# Patient Record
Sex: Male | Born: 1950 | Race: White | Hispanic: No | Marital: Married | Smoking: Former smoker
Health system: Southern US, Community
[De-identification: ages and names within clinical notes are randomized; demographics above are authoritative.]

## PROBLEM LIST (undated history)

## (undated) DIAGNOSIS — I10 Essential (primary) hypertension: Secondary | ICD-10-CM

## (undated) DIAGNOSIS — I6529 Occlusion and stenosis of unspecified carotid artery: Secondary | ICD-10-CM

## (undated) DIAGNOSIS — M1990 Unspecified osteoarthritis, unspecified site: Secondary | ICD-10-CM

## (undated) DIAGNOSIS — K219 Gastro-esophageal reflux disease without esophagitis: Secondary | ICD-10-CM

## (undated) DIAGNOSIS — Z87442 Personal history of urinary calculi: Secondary | ICD-10-CM

## (undated) DIAGNOSIS — C61 Malignant neoplasm of prostate: Secondary | ICD-10-CM

## (undated) DIAGNOSIS — E119 Type 2 diabetes mellitus without complications: Secondary | ICD-10-CM

## (undated) DIAGNOSIS — Z794 Long term (current) use of insulin: Secondary | ICD-10-CM

## (undated) DIAGNOSIS — G4730 Sleep apnea, unspecified: Secondary | ICD-10-CM

## (undated) DIAGNOSIS — G4733 Obstructive sleep apnea (adult) (pediatric): Secondary | ICD-10-CM

## (undated) DIAGNOSIS — N210 Calculus in bladder: Secondary | ICD-10-CM

## (undated) DIAGNOSIS — E785 Hyperlipidemia, unspecified: Secondary | ICD-10-CM

## (undated) HISTORY — PX: BACK SURGERY: SHX140

## (undated) HISTORY — PX: LUMBAR FUSION: SHX111

## (undated) HISTORY — PX: KNEE ARTHROPLASTY: SHX992

## (undated) HISTORY — DX: Type 2 diabetes mellitus without complications: E11.9

## (undated) HISTORY — PX: KNEE ARTHROSCOPY: SHX127

## (undated) HISTORY — DX: Unspecified osteoarthritis, unspecified site: M19.90

## (undated) HISTORY — PX: APPENDECTOMY: SHX54

## (undated) HISTORY — PX: COLONOSCOPY: SHX174

## (undated) HISTORY — PX: PROSTATE SURGERY: SHX751

## (undated) HISTORY — PX: INSERTION PROSTATE RADIATION SEED: SUR718

## (undated) HISTORY — PX: CERVICAL FUSION: SHX112

## (undated) HISTORY — DX: Type 2 diabetes mellitus without complications: Z79.4

---

## 2004-01-30 ENCOUNTER — Ambulatory Visit (HOSPITAL_BASED_OUTPATIENT_CLINIC_OR_DEPARTMENT_OTHER): Admission: RE | Admit: 2004-01-30 | Discharge: 2004-01-30 | Payer: Self-pay | Admitting: Orthopaedic Surgery

## 2004-01-30 ENCOUNTER — Ambulatory Visit (HOSPITAL_COMMUNITY): Admission: RE | Admit: 2004-01-30 | Discharge: 2004-01-30 | Payer: Self-pay | Admitting: Orthopaedic Surgery

## 2004-06-10 ENCOUNTER — Inpatient Hospital Stay (HOSPITAL_COMMUNITY): Admission: RE | Admit: 2004-06-10 | Discharge: 2004-06-11 | Payer: Self-pay | Admitting: Neurosurgery

## 2005-08-11 ENCOUNTER — Ambulatory Visit (HOSPITAL_COMMUNITY): Admission: RE | Admit: 2005-08-11 | Discharge: 2005-08-12 | Payer: Self-pay | Admitting: Neurosurgery

## 2005-09-03 ENCOUNTER — Inpatient Hospital Stay (HOSPITAL_COMMUNITY): Admission: RE | Admit: 2005-09-03 | Discharge: 2005-09-07 | Payer: Self-pay | Admitting: Neurosurgery

## 2005-11-19 ENCOUNTER — Encounter: Admission: RE | Admit: 2005-11-19 | Discharge: 2005-11-19 | Payer: Self-pay | Admitting: Neurosurgery

## 2006-04-15 ENCOUNTER — Encounter: Payer: Self-pay | Admitting: Vascular Surgery

## 2006-04-15 ENCOUNTER — Ambulatory Visit (HOSPITAL_COMMUNITY): Admission: RE | Admit: 2006-04-15 | Discharge: 2006-04-15 | Payer: Self-pay | Admitting: Orthopedic Surgery

## 2006-05-11 ENCOUNTER — Ambulatory Visit (HOSPITAL_COMMUNITY): Admission: RE | Admit: 2006-05-11 | Discharge: 2006-05-11 | Payer: Self-pay | Admitting: Orthopedic Surgery

## 2006-05-14 ENCOUNTER — Emergency Department (HOSPITAL_COMMUNITY): Admission: EM | Admit: 2006-05-14 | Discharge: 2006-05-14 | Payer: Self-pay | Admitting: Emergency Medicine

## 2007-08-31 ENCOUNTER — Encounter: Admission: RE | Admit: 2007-08-31 | Discharge: 2007-08-31 | Payer: Self-pay | Admitting: Neurosurgery

## 2007-12-28 ENCOUNTER — Ambulatory Visit (HOSPITAL_COMMUNITY): Admission: RE | Admit: 2007-12-28 | Discharge: 2007-12-28 | Payer: Self-pay | Admitting: Orthopedic Surgery

## 2008-06-14 ENCOUNTER — Ambulatory Visit (HOSPITAL_COMMUNITY): Admission: RE | Admit: 2008-06-14 | Discharge: 2008-06-14 | Payer: Self-pay | Admitting: Orthopedic Surgery

## 2009-02-13 ENCOUNTER — Encounter: Admission: RE | Admit: 2009-02-13 | Discharge: 2009-02-13 | Payer: Self-pay | Admitting: Neurosurgery

## 2009-08-27 ENCOUNTER — Ambulatory Visit: Payer: Self-pay | Admitting: Otolaryngology

## 2009-09-10 ENCOUNTER — Ambulatory Visit: Payer: Self-pay | Admitting: Otolaryngology

## 2009-09-16 ENCOUNTER — Ambulatory Visit: Payer: Self-pay | Admitting: Otolaryngology

## 2010-03-13 ENCOUNTER — Inpatient Hospital Stay: Payer: Self-pay | Admitting: Surgery

## 2010-11-17 NOTE — Op Note (Signed)
NAMELES, LONGMORE              ACCOUNT NO.:  [id number]   MEDICAL RECORD NO.:  [id number]          PATIENT TYPE:  AMB   LOCATION:  SDS                          FACILITY:  [facility]   PHYSICIAN:  Madlyn Frankel. Charlann Boxer, M.D.  DATE OF BIRTH:  Aug 24, 1950   DATE OF PROCEDURE:  06/14/2008  DATE OF DISCHARGE:  06/14/2008                               OPERATIVE REPORT   PREOPERATIVE DIAGNOSIS:  Recurrent symptoms following right knee  arthroscopy with findings concerning for medial meniscal pathology with  some underlying degenerative changes.   POSTOPERATIVE DIAGNOSES/FINDINGS:  1. The patient was noted to have consistent and nonprogressive      patellofemoral changes as previously reviewed.  2. Grade 2-3 changes within the medial compartment of the knee without      evidence of significant progression.  No evidence of eburnated      signs.  3. Medial meniscal pathology medially.  4. Small tearing of the mid body posterior horn of the lateral      meniscus, central portion.  5. Loose body approximately 1 cm diameter.  6. Synovitis scarring.   PROCEDURES:  1. Right knee diagnostic and operative arthroscopy with medial      meniscectomy.  2. Lateral partial meniscectomy.  3. Removal of loose body.  4. Synovectomy.  5. Medial patellofemoral chondral flaps, debridement of loose flaps of      cartilage.   SURGEON:  Madlyn Frankel. Charlann Boxer, MD   ASSISTANT:  None.   ANESTHESIA:  Preoperative local block plus general anesthesia plus a  postoperative block.   COMPLICATIONS:  None.   DRAINS:  None.   INDICATIONS FOR PROCEDURE:  Mr. Cornfield is a 60 year old Worker's Comp  patient, a patient of mine with previous arthroscopy for medial meniscal  pathology with underlying degenerative changes.  He failed to respond to  the postoperative course and had an MRI repeated, which did reveal  evidence concerning for repair or persistent tearing of the medial  meniscus.  Given the fact that he failed  conservative measures, did not  respond to cortisone injections, physical therapy, and medications, we  opted to proceed with right knee re-arthroscopy.  Risks, benefits were  discussed including the potential risk for arthritis, persistent  discomfort.  Consent was obtained.  A second opinion had also been  obtained revealing his finding.   PROCEDURE IN DETAIL:  The patient was brought to the operative theater.  Once adequate anesthesia, preoperative antibiotics administered, the  patient was positioned supine with the right leg in a leg holder.  Right  lower extremity was pre-scrubbed and prepped and draped in a sterile  fashion.  The patient's previous incisions in the inferolateral,  inferomedial, superomedial portals were utilized.  Diagnostic evaluation  of the knee including thorough examination revealed the above findings.  I focused the vast majority of the procedure medially as this is where  his primary symptoms.  A thorough examination did not reveal any very  unstable flaps or cartilage.  I did remove the vast majority of this  posterior horn leaving the attachment in place.  I then also extended  some of this debridement along the mid body and medial portion.  Following this, I further examined the meniscus to make sure that it was  stable and there were no unstable flaps or anything that flipped  underneath or above the meniscus.  I took the opportunity to debride  some flaps of cartilage within the medial compartment as well.   Lateral compartment was noted to be relatively intact, but this is where  I identified the loose body through the posterolateral aspect.  It was  removed with a combination of pituitary rongeur and a shaver.   I did use a straight-biting basket to remove some of the mid body  posterior horn meniscus, which had a central tearing.  Anteriorly,  significant synovectomy was carried out over the anterior and medial  aspect of the knee.  Previously  noted degenerative changes within the  patellofemoral compartment were dealt with as well with a 3.5 Cuda  shaver.   Following the removal of all the above, I reexamined the knee to make  sure there was no other unstable flaps or cartilage, any loose bodies,  etc.  Once I was satisfied that there were none, the instrumentation was  removed.  Portal sites all reapproximated with a nylon suture, and I  injected the knee with 25 mL of 0.25% Marcaine with epinephrine.  The  knee was then dressed into a sterile bulky Jones dressing.  He was  extubated and brought to the recovery in stable condition, tolerating  the procedure well.   Plans are to see him back in office for routine followup in 10-12 days.  Medications prescribed.  Ice recommended.  Crutches as needed to help  decrease some of his initial post-operative pain due to weight bearing.      Madlyn Frankel Charlann Boxer, M.D.  Electronically Signed     MDO/MEDQ  D:  06/14/2008  T:  06/15/2008  Job:  [id number]

## 2010-11-17 NOTE — Op Note (Signed)
NAMESYRUS, NAKAMA              ACCOUNT NO.:  [id number]   MEDICAL RECORD NO.:  [id number]          PATIENT TYPE:  AMB   LOCATION:  DAY                          FACILITY:  [facility]   PHYSICIAN:  Madlyn Frankel. Charlann Boxer, M.D.  DATE OF BIRTH:  1951/05/19   DATE OF PROCEDURE:  12/28/2007  DATE OF DISCHARGE:                               OPERATIVE REPORT   PREOPERATIVE DIAGNOSIS:  Left knee medial meniscal tear associated with  lateral meniscal tearing and osteoarthritis.   POSTOPERATIVE DIAGNOSIS/FINDINGS:  1. Complex tear to the posterior horn medial meniscus all way to the      mid body portion with extensive involvement.  2. 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  3. Tearing to the center portion of the anterior horn mid body of the      lateral meniscus with minimal degenerative changes noted laterally.  4. Extensive degenerative changes to the patellofemoral compartment      with diffuse grade III changes all over the trochanter.  5. Synovitis.   PROCEDURE:  Right knee diagnostic and operative arthroscopy with medial  and lateral meniscectomies, limited synovectomy, anterior medial and  patellofemoral and medial chondroplasty.   SURGEON:  Madlyn Frankel. Charlann Boxer, M.D.   ASSISTANT:  None.   ANESTHESIA:  General plus locally administered anesthetic.  The general  was a MAC anesthesia.   COMPLICATIONS:  None.   DRAINS:  None.   BLOOD LOSS:  Minimal.   INDICATIONS FOR PROCEDURE:  Mr. Berthelot is a 60 year old gentleman who  presented to the office for evaluation of right knee pain with  mechanical type symptoms and failure to respond to conservative  measures.  MRI revealed meniscal pathology.  Based on the mechanism of  injury and pre-injury did not have any problems in the knee, we reviewed  with him the indications of this procedure.  He had a previous surgery  on the left knee and felt that it had done quite well and was eager to  have this done to get rid of these  mechanical symptoms.  Risks and  benefits discussed including persistent discomfort, including the  potential of progression of arthritis.  We have reviewed in the setting  of worker's comp and the fact that we would work him through therapy for  a full 8 weeks getting him discharged.  Further arthroplasty option  would not be a result of this procedure.  That was reviewed.  Consent  obtained.   PROCEDURE IN DETAIL:  The patient was brought to operative theater.  Once adequate anesthesia, preoperative antibiotics, Ancef administered,  the patient was positioned supine with the right leg in the leg holder.  Right lower extremity was prepped and draped in sterile fashion.  Standard inferomedial, superior medial and inferolateral portals were  utilized.  Diagnostic evaluation of the knee revealed the above  findings.  The inferior medial portal was utilized as a sole working  portal.  Attention was first directed to this medial compartment  following initial debridement of some of the chondral flaps this allowed  for exposure.  I used a biting basket to extensively debride  the  posterior horn.  I then used a 3.5 Cuda shaver and removed all meniscal  fragments and contour remaining meniscus.  This process was repeated at  three different times in order to remove the meniscus back to a stable  level.  There was a large cleavage component and the meniscus was taken  back all way down to the peripheral third to a stable level.  It was  palpated with a hook and found to be stable.  This extended all way to  the mid body junction.  The remaining was okay.  The grade II to III  flaps of the medial compartment were noted to be present and shaved back  to stable level.   Anteriorly, a synovectomy was carried out.  There was a large amount of  synovitic response.  It hyperemic and I debrided some of this back.  There was no significant bleeding but it was necessary for exposure  purposes.  This  revealed extensive trochlear patellofemoral changes  which were not work-related but were debrided back to a stable level.   This again was not a cure of the arthritis.  It was a diagnostic  evaluation and debridement of flaps to prevent further potential injury.  In the lateral compartment noted this to be central tearing to the  central third of the meniscus from the anterior horn lateral to the  anterior horn be used a biting basket and a 3.52 Cuda shaver to debride  this back.   Following this I reexamined the knee a couple of different times to make  sure there is no loose fragments of cartilage.  We removed the  instrumentation, reapproximated the portal sites using a 4-0 nylon.  The  knee was then injected with 25% Marcaine with epinephrine.  The  patient's knee was then cleaned and dressed sterilely with Adaptic and a  bulky sterile dressing.  He was brought to the recovery room in stable  condition, tolerating the procedure well.      Madlyn Frankel Charlann Boxer, M.D.  Electronically Signed     MDO/MEDQ  D:  12/28/2007  T:  12/28/2007  Job:  [id number]

## 2010-11-20 NOTE — Op Note (Signed)
NAMESPARSH, CALLENS NO.:  [id number]   MEDICAL RECORD NO.:  [id number]          PATIENT TYPE:  INP   LOCATION:  3003                         FACILITY:  [facility]   PHYSICIAN:  Payton Doughty, M.D.      DATE OF BIRTH:  [birth year]   DATE OF PROCEDURE:  06/10/2004  DATE OF DISCHARGE:                                 OPERATIVE REPORT   PREOPERATIVE DIAGNOSIS:  Spondylosis, L5-S1.   POSTOPERATIVE DIAGNOSIS:  Spondylosis, L5-S1.  Autofusion at L5-S1.   OPERATION PERFORMED:  Right L5-S1 facetectomy and L5 foraminotomy and  decompressive laminectomy.   SURGEON:  Payton Doughty, M.D.   ANESTHESIA:  General.   INDICATIONS FOR PROCEDURE:  The patient is a 60 year old gentleman with  severe spondylosis at L5-S1 and right L5 and S1 radiculopathies.  Plan was  for a decompression and fusion at that level.   DESCRIPTION OF PROCEDURE:  The patient was taken to the operating room,  smoothly anesthetized, intubated, and placed prone on the operating table.  Following shave, prep and drape in the usual sterile fashion, the skin was  infiltrated with 1% lidocaine with 1:400,000 epinephrine.  Skin was incised  from mid-S1 to mid-L4 and the lamina and transverse process of L5 and S1  were exposed bilaterally in the subperiosteal plane.  Intraoperative x-ray  confirmed correctness of level.  Having confirmed correctness of level, the  pars interarticularis, lamina and inferior facet of L5 and the superior  facet of S1 were removed on the right side and the site of the previous  laminectomy.  This allowed decompression of the right L5 root.  The 5 root  was decompressed as it rounded the 5 pedicle.  It was followed completely  distally out into the soft tissue at the dorsal root ganglion.  This  resulted in complete decompression of the root.  The L5-S1 disk space was  then attempted to explore but it was found that it was completely calcified  with an osseous union across it.  It was  carefully inspected and careful  manipulation of tones demonstrated a solid fusion across the disk space.  Therefore, it was decided not to take all the bone out simply to put a new  fusion in there.  The wound was then irrigated and hemostasis assured.  The  nerve root was covered with Depo-Medrol soaked fat.  The fascia was  reapproximated with 0 Vicryl in interrupted fashion and subcutaneous tissue  was reapproximated with 0 Vicryl in interrupted fashion and the skin was  closed with 3-0 nylon in a running locked fashion.  Betadine Telfa dressing  was applied and made occlusive with Op-Site.  The patient was then  transferred to the recovery room in good condition.       MWR/MEDQ  D:  06/10/2004  T:  06/11/2004  Job:  [id number]

## 2010-11-20 NOTE — Op Note (Signed)
NAMETHAO, VANOVER              ACCOUNT NO.:  [id number]   MEDICAL RECORD NO.:  [id number]          PATIENT TYPE:  AMB   LOCATION:  SDS                          FACILITY:  [facility]   PHYSICIAN:  Payton Doughty, M.D.      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   POSTOPERATIVE DIAGNOSIS:  Spondylosis at C3-4.   OPERATIVE PROCEDURE:  Attempted C3-4 anterior decompression and fusion.   SURGEON:  Payton Doughty, M.D.   SERVICE:  Neurosurgery.   ANESTHESIA:  General endotracheal.   PREPARATION:  Prepped with sterile Betadine and scrubbed with alcohol wipe.   COMPLICATIONS:  Inability to access the C3-4 disk space.   DESCRIPTION OF PROCEDURE:  This is a 60 year old gentleman with spondylosis  at C3-4 who was taken to the operating room, smoothly anesthetized and  intubated, and placed supine on the operating table.  Following shaving,  prepped and draped in the usual sterile fashion.  Skin was incised about a  fingerbreadth below the angle of the mandible.  It extended from the midline  of the sternocleidomastoid's medial border.  Dissection through this  revealed a very large submandibular gland protruding down in the submental  space.  The sternocleidomastoid was identified.  Medial dissection was  carried out.  The carotid was identified, as were the bones of the anterior  cervical spine.  Working up as high as possible, a disk space was  encountered that was impossible to go above it without taking out some  parotid gland.  This was not something we were prepared to do.  An x-ray  demonstrated that we were at the C5-6 disk space.  I therefore felt it was  imprudent to continue and terminated the procedure.  The wound was irrigated  and hemostasis assured.  The platysma was closed with 3-0 Vicryl in  interrupted fashion, subcutaneous tissues were  reapproximated with 3-0  Vicryl in interrupted fashion and skin was closed with 4-0 Vicryl in a  running subcuticular fashion.  Benzoin and Steri-Strips were placed and made  occlusive with Telfa and OpSite, and the patient returned to the recovery  room in good condition.           ______________________________  Payton Doughty, M.D.     MWR/MEDQ  D:  08/11/2005  T:  08/11/2005  Job:  [id number]

## 2010-11-20 NOTE — Op Note (Signed)
Alexander Wilcox, Alexander Wilcox              ACCOUNT NO.:  [id number]   MEDICAL RECORD NO.:  [id number]          PATIENT TYPE:  AMB   LOCATION:  DAY                          FACILITY:  [facility]   PHYSICIAN:  John L. Rendall, M.D.  DATE OF BIRTH:  [birth year]   DATE OF PROCEDURE:  05/11/2006  DATE OF DISCHARGE:                                 OPERATIVE REPORT   INDICATIONS AND JUSTIFICATION FOR PROCEDURE:  History of chronic left knee  pain with swelling, crepitance with MRI positive for torn medial and lateral  menisci, left knee.  Justification for surgery in the main OR, sleep apnea,  and multiple medical issues but stable to return home if all goes well.   PREOPERATIVE DIAGNOSIS:  Torn medial and lateral menisci.   SURGICAL PROCEDURES:  Arthroscopic medial and lateral meniscectomy.   POSTOPERATIVE DIAGNOSIS:  Torn medial and lateral menisci.   SURGEON:  John L. Rendall, M.D.   ANESTHESIA:  MAC.   PROCEDURE:  Under MAC anesthesia, the left knee is prepared with DuraPrep  and draped as a sterile field post laterally with findings of a complex tear  of the lateral meniscus were found. Using a combination of straight, curved  right, 90 degrees right, and curved Marlon shaver,  this meniscus was taken  to stable meniscal rim. Fortunately, only grade 2 changes on the femoral  condyle and tibia were seen. In the medial compartment, there was  significant chondromalacia in the central weight bearing area, grade 2, that  required trimming with the shaver and posterior horn complex tear of the  meniscus. Using curved Marlon shaver, local chondroplasty was done and a  flap tear in the posterior horn was found and resected with a combination of  basket forceps, straight, curved and upbiting. Once this was completed, the  remainder of the knee was examined.  No significant patellofemoral  arthritis. The knee was then infiltrated with Marcaine with epinephrine and  a sterile compression bandage was  applied.  The patient returned to recovery  in good condition.      John L. Rendall, M.D.  Electronically Signed     JLR/MEDQ  D:  05/11/2006  T:  05/11/2006  Job:  [id number]

## 2010-11-20 NOTE — Op Note (Signed)
Alexander Wilcox, Alexander Wilcox NO.:  [id number]   MEDICAL RECORD NO.:  [id number]          PATIENT TYPE:  INP   LOCATION:  3025                         FACILITY:  [facility]   PHYSICIAN:  Payton Doughty, M.D.      DATE OF BIRTH:  1950/11/22   DATE OF PROCEDURE:  09/03/2005  DATE OF DISCHARGE:                                 OPERATIVE REPORT   PREOPERATIVE DIAGNOSIS:  Cervical spondylosis, C3-4.   POSTOPERATIVE DIAGNOSIS:  Cervical spondylosis, C3-4.   OPERATIVE PROCEDURE:  C3-4 posterior cervical decompression and fusion.   SURGEON:  Payton Doughty, MD.   NURSE ASSISTANTBasilia Jumbo.   ANESTHESIA:  General endotracheal.   PREPARATION:  Chlorhexidine scrub and alcohol wipe.   COMPLICATIONS:  None.   DESCRIPTION OF PROCEDURE:  This is a 60 year old, right-handed, white  gentleman with cervical spondylosis, who had had an attempted anterior  decompression at 3-4 and anatomically could not reach the 3-4 interspace so  he was operated from [employer].  Taken to the operating room and smoothly  anesthetized and intubated, placed on the [employer] bed and placed prone.  Following shave, prep, and drape in the usual sterile fashion, the skin was  infiltrated with 1% Lidocaine with 1:400,000 epinephrine and the skin  incised from C2 to C5 and the laminae of C3 and C4 were exposed bilaterally  in the subperiosteal plane, intraoperative x-ray confirmed correctness  level.  Lateral mass screws were placed in 3 and 4 using the standard  landmarks and intraoperative x-ray showed good placement of screws.  They  were connected with the rod.  Locking caps were placed and tightened.  A  central laminectomy was done of C3 and the superior portion of C4.  The sac  itself appeared somewhat tight.  The wound was irrigated and hemostasis  assured.  The remaining laminae and lateral mass of decorticated and BMP on  the extender matrix was placed over them.  Successive layers of #0 Vicryl,  #2-0 Vicryl,  and #3-0 nylon were used to close the fascia and muscle,  subcuticular tissues, and skin respectively.  A Betadine-Telfa dressing was  applied, made occlusive with OpSite, and the patient returned to the  recovery room in good condition.           ______________________________  Payton Doughty, M.D.     MWR/MEDQ  D:  09/03/2005  T:  09/04/2005  Job:  [id number]

## 2010-11-20 NOTE — H&P (Signed)
Alexander Wilcox, AGE              ACCOUNT NO.:  [id number]   MEDICAL RECORD NO.:  [id number]          PATIENT TYPE:  AMB   LOCATION:  SDS                          FACILITY:  [facility]   PHYSICIAN:  Payton Doughty, M.D.      DATE OF BIRTH:  1950/09/17   DATE OF ADMISSION:  08/11/2005  DATE OF DISCHARGE:                                HISTORY & PHYSICAL   ADMITTING DIAGNOSIS:  Disc and spondylosis C3-4.   SERVICE:  Neurosurgery.   This is a very nice now 60 year old right-handed white gentleman. He has had  several lumbar discs done, been doing well, then developed a lot of pain in  his neck radiating down both arms. Did not have a noted deficit. MR showed  spondylosis 3-4 with posterior osteophytes and compression of the spinal  cord. Significant distraction posterior longitudinal ligament. He is  admitted for an anterior decompression and fusion at that level.   MEDICAL HISTORY:  Fairly benign. He has hypertension and a little  cardiomyopathy. He takes:  1.  Toprol-XL 12.5 mg a day.  2.  Accupril 10 mg a day.  3.  Proscar 5 mg a day.  4.  Lasix 40 mg a day on a p.r.n. basis.   He is sensitive to ANTIINFLAMMATORIES so far as they cause him to retain  fluid.   SURGICAL HISTORY:  Lumbar disc x2 in the past.   SOCIAL HISTORY:  Does not smoke, quit 10 years ago. Drinks on a limited  social basis. Is a Midwife.   FAMILY HISTORY:  Both parents are deceased; histories are not given.   REVIEW OF SYSTEMS:  Remarkable for glasses, hypertension, swelling in his  hands and feet, shortness of breath, arm weakness, leg weakness, back pain,  leg pain, joint pain, and neck pain.   PHYSICAL EXAMINATION:  HEENT:  Within normal limits.  NECK:  He has somewhat limited range of motion of his neck. He does not have  Lhermitte's.  CHEST:  Clear.  CARDIAC:  Regular rate and rhythm.  ABDOMEN:  Large but nontender with no hepatosplenomegaly.  EXTREMITIES:  Without clubbing or cyanosis.  GENITOURINARY:  Deferred.  PERIPHERAL PULSES:  Good.  NEUROLOGIC:  He is awake, alert, and oriented. Cranial nerves intact. Motor  exam shows 5/5 strength throughout the upper and lower extremities at this  time. There is no sensory deficit. Reflexes are 1 at the biceps, 2 at the  triceps, 1 at the brachioradialis. Hoffman's is negative.   The MR results have been reviewed above. It basically shows spinal stenosis.   CLINICAL IMPRESSION:  Cervical spondylosis with cord compression without  myelopathic features at this time.   The plan is for an anterior decompression and fusion at C3-4 with a Reflex  hybrid plate. The risks and benefits of this approach have been discussed  with him and he wished to proceed.           ______________________________  Payton Doughty, M.D.     MWR/MEDQ  D:  08/11/2005  T:  08/11/2005  Job:  [id number]

## 2010-11-20 NOTE — Op Note (Signed)
Alexander Wilcox, Alexander Wilcox                          ACCOUNT NO.:  [id number]   MEDICAL RECORD NO.:  [id number]                   PATIENT TYPE:  AMB   LOCATION:  DSC                                  FACILITY:  [facility]   PHYSICIAN:  Claude Manges. Cleophas Dunker, M.D.            DATE OF BIRTH:  [birth year]   DATE OF PROCEDURE:  01/30/2004  DATE OF DISCHARGE:                                 OPERATIVE REPORT   PREOPERATIVE DIAGNOSIS:  Tear of medial and lateral menisci of left knee  with tricompartmental degenerative changes.   POSTOPERATIVE DIAGNOSIS:  Tear of medial and lateral menisci of left knee  with tricompartmental degenerative changes.   OPERATION PERFORMED:  1. Diagnostic arthroscopy, left knee.  2. Partial medial and lateral meniscectomy.  3. Shaving of patella and trochlea with microfracture technique to trochlea.   SURGEON:  Claude Manges. Cleophas Dunker, M.D.   ANESTHESIA:  IV sedation and local 1% Xylocaine with epinephrine.   COMPLICATIONS:  None.   INDICATIONS FOR PROCEDURE:  The patient is a 60 year old gentleman who is  approximately two months status post injury to his left knee with a twisting  mechanism injury while working in his yard.  He was having little pain and  discomfort to his knee prior that time.  His pain has been consistent with a  tear of the medial meniscus which was confirmed with an MRI scan.  He does  have evidence of a tear of the medial meniscus possibly the lateral meniscus  and he also has tricompartmental degenerative changes, mostly within the  medial compartment.  He is now to have an arthroscopic evaluation.   DESCRIPTION OF PROCEDURE:  With the patient comfortable on the operating  table and under IV sedation, the left lower extremity was placed in a thigh  holder.  The leg was then prepped with DuraPrep from the thigh holder to the  ankle.  Sterile draping was performed.  Diagnostic arthroscopy was performed  using a medial and lateral parapatellar tendon stab  wound.  There was a very  small clear yellow joint effusion.   There were several very small pieces of articular cartilage floating within  the superior pouch.  These were removed with intra-articular shaver and  suction.  There was an area of chondromalacia of the patella in the central  portion which was debrided and a large area of chondromalacia of the  trochlea.  This was also debrided and microfracture technique performed such  that I was able to stabilize the cartilage.  It was an area just smaller  than the size of a quarter.   The medial compartment was evaluated with evidence of a chronic tear of the  posterior horn of the medial meniscus and probably involving the posterior  third.  There was a horizontal cleavage tear as well as flap tear.  These  were debrided with a series of basket forceps and the intra-articular  shaver.  It was then carefully probed without evidence of any further  tearing but there was some yellowing discoloration consistent with chronic  degenerative changes within the meniscus.  There was chondromalacia of the  medial compartment involving predominantly the medial femoral condyle, grade  3 changes and probably grade 2 changes of the medial tibial plateau.  The  ACL appeared to be intact, but there were some areas of longitudinal rents.  There was a negative anterior drawer sign.   The lateral meniscus was chronically frayed. There was a large cleavage tear  beginning posteriorly and ending about the midportion.  This was debrided  with the basket forceps and then the intra-articular shaver.  There was one  small flap tear of the anterior horn which was also debrided.  There were  only grade 2 changes of the medial lateral compartment.  The joint was then  explored without evidence of loose material.  The two stab wounds were left  open, infiltrated with 0.25% Marcaine with epinephrine.  Sterile bulky  dressing was applied followed by an Ace  bandage.   PLAN:  Percocet for pain. Office one week.                                               Claude Manges. Cleophas Dunker, M.D.    PWW/MEDQ  D:  01/30/2004  T:  01/30/2004  Job:  [id number]

## 2010-11-20 NOTE — Discharge Summary (Signed)
NAMEJOSHUAJAMES, MOEHRING NO.:  [id number]   MEDICAL RECORD NO.:  [id number]          PATIENT TYPE:  INP   LOCATION:  3025                         FACILITY:  [facility]   PHYSICIAN:  Payton Doughty, M.D.      DATE OF BIRTH:  Oct 15, 1950   DATE OF ADMISSION:  09/03/2005  DATE OF DISCHARGE:  09/07/2005                                 DISCHARGE SUMMARY   ADMITTING DIAGNOSIS:  Spondylosis, cervical 3, 4.   DISCHARGE DIAGNOSIS:  Spondylosis, cervical 3, 4.   OPERATION PERFORMED:  Cervical 3, 4 laminectomy with posterolateral fusion.   COMPLICATIONS:  Right increased neurologic deficit.   DISCHARGE CONDITION:  Patient was discharge is satisfactory condition,  __________ .   BRIEF HISTORY AND HOSPITAL COURSE:  This is a 60 year old right-handed white  gentleman who underwent  attempted 3-4 anterior decompression a month ago,  and because of his body habitus he could not get up.  He was admitted for  posterior fusion of his 3-4 spondylosis.  History and physical are in the  chart.   The patient was admitted after ascertaining laboratory values and underwent  a posterior cervical fusion.  The first night he had complaints of numbness  in his left hand. The next day he lost some dexterity in his left hand had  some difficulty in his left leg. He underwent extensive evaluation including  x-rays, which showed good positioning of the hardware.  CT did not  demonstrate any clots, but did show that he had anterior spondylosis.  He  did not have weakness in his hand.  There was appropriate receptive loss, a  bit, of a tactile loss  and the same was true of his leg.  This was felt to  be consistent with dorsal column injury probably incurred at the time of the  laminectomy.  He was placed on a Decadron taper and currently is markedly  improved.  He was able to use his left hand with a reasonable amount of  facility, i.e. hanging up the telephone, picking up pens and pencils.  His  strength is full. His gait is normal.   The patient is being discharged to home in the care of his family with  intact dry, well-healing incision.  Neurological exam is as described above.   FOLLOW UP:  The patient's follow up will be in the [facility] office in a week for suture removal.           ______________________________  Payton Doughty, M.D.     MWR/MEDQ  D:  09/07/2005  T:  09/07/2005  Job:  [id number]

## 2010-11-20 NOTE — H&P (Signed)
NAMEALPHONSE, ASBRIDGE NO.:  [id number]   MEDICAL RECORD NO.:  [id number]          PATIENT TYPE:  INP   LOCATION:  NA                           FACILITY:  [facility]   PHYSICIAN:  Payton Doughty, M.D.      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   SERVICE:  Neurosurgery.   BODY OF TEXT:  A self-referred very nice 60 year old right-handed white  gentleman who in 1998 had a right 5-1 disk done by Dr. Rhae Lerner, has had right  leg pain ever since.  It has been getting worse, as well as pain in his  back.  He had some difficulty with his knee and got it straightened around  by Dr. Claude Manges. [place], and is now admitted for diskectomy and fusion.   PAST MEDICAL HISTORY:  His medical history is remarkable for:  1.  Hypertension.  2.  Cardiomyopathy.   MEDICATIONS:  He takes:  1.  Toprol-XL 12.5 mg a day.  2.  Accupril 10 mg a day.  3.  Proscar 5 mg a day.  4.  Lasix 40 mg a day on a p.r.n. basis for swelling.   ALLERGIES:  He is sensitive to ANTI-INFLAMMATORIES.   SURGICAL HISTORY:  Lumbar disk in 1998.   SOCIAL HISTORY:  He does not smoke, he quit about 10 years ago.  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   FAMILY HISTORY:  Both parents are deceased, histories are not given.   REVIEW OF SYSTEMS:  Review of systems is remarkable for glasses,  hypertension, swelling in his hands and feet, shortness of breath, arm  weakness, leg weakness, back pain, leg pain and joint pain.   PHYSICAL EXAMINATION:  HEENT:  His HEENT exam is within normal limits.  NECK:  He has limited range of motion in the neck.  No Lhermitte's.  CHEST:  Chest is clear.  CARDIAC:  Regular rate and rhythm.  ABDOMEN:  Abdomen is nontender, although large.  There is no  hepatosplenomegaly.  EXTREMITIES:  Extremities  are without clubbing or cyanosis.  GU:  Exam is deferred.  PERIPHERAL VASCULAR:  Peripheral pulses are good.  NEUROLOGIC:  Neurologically, he is awake, alert and oriented.  His cranial  nerves are intact.  Motor exam shows 5/5 throughout the upper and lower  extremities.  There is no current sensory deficit.  Hoffmann's is negative.  Reflexes at the knees are 2.  Ankle jerks are 2 on the left and 1 on the  right.  Straight leg raise is very positive on the right.   ASSESSMENT AND PLAN:  MRI was obtained and demonstrates recurrent disk at 5-  1 on the right and the plan is for a lumbar laminectomy, diskectomy and  posterior lumbar interbody fusion procedure with Ray threaded fusion cages,  and posterolateral arthrodesis.  The risks and benefits of this approach  have been  discussed with him and he wishes to proceed.       MWR/MEDQ  D:  06/10/2004  T:  06/10/2004  Job:  [id number]

## 2010-11-20 NOTE — H&P (Signed)
NAMECRISTAN, Alexander Wilcox NO.:  [id number]   MEDICAL RECORD NO.:  [id number]           PATIENT TYPE:   LOCATION:                               FACILITY:  [facility]   PHYSICIAN:  Payton Doughty, M.D.      DATE OF BIRTH:  [birth year]   DATE OF ADMISSION:  09/03/2005  DATE OF DISCHARGE:                                HISTORY & PHYSICAL   ADMISSION DIAGNOSIS:  Spondylosis C3-4.   HISTORY:  This is a 60 year old, right-handed white gentleman who has had an  attempted C3-4 anterior decompression one month ago.  Was unable to do it  because of his body habitus, now here for a posterior neck fusion.  He has  C3-4 spondylosis.   PAST MEDICAL HISTORY:  1.  Hypertension.  2.  A little cardiomyopathy.   MEDICATIONS:  1.  Toprol XL 12.5 mg daily.  2.  Accupril 10 mg daily.  3.  Proscar 5 mg daily.  4.  Lasix 40 mg on a p.r.n. basis.   ALLERGIES:  He is sensitive to ANTI-INFLAMMATORIES.   PAST SURGICAL HISTORY:  Lumbar disk in 1998.   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  Drinks on a limited  social basis.   FAMILY HISTORY:  Not given.   REVIEW OF SYSTEMS:  Remarkable for glasses, hypertension, swelling of his  hands and feet, shortness of breath, arm weakness, leg weakness, back pain,  leg pain, joint and neck pain.   PHYSICAL EXAMINATION:  HEENT:  Within normal limits.  NECK:  He has a somewhat limited range of motion.  He does not have a  Lhermitte's.  CHEST:  Clear.  HEART:  A regular rate and rhythm.  ABDOMEN:  Large but nontender.  No hepatosplenomegaly.  EXTREMITIES:  Without clubbing or cyanosis.  GENITOURINARY:  Exam is deferred.  EXTREMITIES:  Peripheral pulses are good.  NEUROLOGIC:  He is awake, alert and oriented.  Cranial nerves are intact.  Motor exam shows 5/5 strength throughout the upper and lower extremities.  He has a lot of neck pain and pain down across his shoulders.  There is no  Hoffman.  Reflexes are 2 at the biceps, 1 at the  triceps.  Lower extremities  are non-myelopathic.   An MR demonstrates significant spondylosis at C3-4.   CLINICAL IMPRESSION:  Cervical spondylosis.  Unable to fuse from anterior.   PLAN:  Will fuse posteriorly with lateral mesh and screws.  The risks and  benefits of this approach have been discussed with him and he wishes to  proceed.           ______________________________  Payton Doughty, M.D.     MWR/MEDQ  D:  09/03/2005  T:  09/03/2005  Job:  [id number]

## 2011-04-01 LAB — CBC
HCT: 45.1
Hemoglobin: 15.7
MCHC: 34.8
MCV: 90.4
Platelets: 185
RBC: 4.99
RDW: 14
WBC: 7.6

## 2011-04-01 LAB — DIFFERENTIAL
Basophils Absolute: 0
Basophils Relative: 0
Eosinophils Absolute: 0.1
Eosinophils Relative: 2
Lymphocytes Relative: 25
Lymphs Abs: 1.9
Monocytes Absolute: 0.6
Monocytes Relative: 8
Neutro Abs: 4.9
Neutrophils Relative %: 65

## 2011-04-01 LAB — URINALYSIS, ROUTINE W REFLEX MICROSCOPIC
Bilirubin Urine: NEGATIVE
Glucose, UA: NEGATIVE
Hgb urine dipstick: NEGATIVE
Ketones, ur: NEGATIVE
Nitrite: NEGATIVE
Protein, ur: NEGATIVE
Specific Gravity, Urine: 1.015
Urobilinogen, UA: 0.2
pH: 5.5

## 2011-04-01 LAB — BASIC METABOLIC PANEL
BUN: 16
CO2: 33 — ABNORMAL HIGH
Calcium: 9.3
Chloride: 103
Creatinine, Ser: 1.16
GFR calc Af Amer: >60
GFR calc non Af Amer: >60
Glucose, Bld: 101 — ABNORMAL HIGH
Potassium: 3.9
Sodium: 143

## 2011-04-01 LAB — APTT: aPTT: 28

## 2011-04-01 LAB — PROTIME-INR
INR: 1
Prothrombin Time: 12.9

## 2011-04-09 LAB — DIFFERENTIAL
Basophils Absolute: 0 10*3/uL (ref 0.0–0.1)
Basophils Relative: 0 % (ref 0–1)
Eosinophils Absolute: 0.2 10*3/uL (ref 0.0–0.7)
Eosinophils Relative: 3 % (ref 0–5)
Lymphocytes Relative: 26 % (ref 12–46)
Lymphs Abs: 2 10*3/uL (ref 0.7–4.0)
Monocytes Absolute: 0.4 10*3/uL (ref 0.1–1.0)
Monocytes Relative: 6 % (ref 3–12)
Neutro Abs: 5 10*3/uL (ref 1.7–7.7)
Neutrophils Relative %: 66 % (ref 43–77)

## 2011-04-09 LAB — URINALYSIS, ROUTINE W REFLEX MICROSCOPIC
Bilirubin Urine: NEGATIVE
Glucose, UA: NEGATIVE mg/dL
Hgb urine dipstick: NEGATIVE
Ketones, ur: NEGATIVE mg/dL
Nitrite: NEGATIVE
Protein, ur: NEGATIVE mg/dL
Specific Gravity, Urine: 1.018 (ref 1.005–1.030)
Urobilinogen, UA: 0.2 mg/dL (ref 0.0–1.0)
pH: 5.5 (ref 5.0–8.0)

## 2011-04-09 LAB — BASIC METABOLIC PANEL
BUN: 16 mg/dL (ref 6–23)
CO2: 28 mEq/L (ref 19–32)
Calcium: 9.3 mg/dL (ref 8.4–10.5)
Chloride: 104 mEq/L (ref 96–112)
Creatinine, Ser: 1.19 mg/dL (ref 0.4–1.5)
GFR calc Af Amer: >60 mL/min (ref >60)
GFR calc non Af Amer: >60 mL/min (ref >60)
Glucose, Bld: 146 mg/dL — ABNORMAL HIGH (ref 70–99)
Potassium: 4.1 mEq/L (ref 3.5–5.1)
Sodium: 138 mEq/L (ref 135–145)

## 2011-04-09 LAB — CBC
HCT: 47.5 % (ref 39.0–52.0)
Hemoglobin: 16.4 g/dL (ref 13.0–17.0)
MCHC: 34.5 g/dL (ref 30.0–36.0)
MCV: 91.8 fL (ref 78.0–100.0)
Platelets: 156 10*3/uL (ref 150–400)
RBC: 5.18 MIL/uL (ref 4.22–5.81)
RDW: 13.2 % (ref 11.5–15.5)
WBC: 7.6 10*3/uL (ref 4.0–10.5)

## 2011-04-09 LAB — PROTIME-INR
INR: 1 (ref 0.00–1.49)
Prothrombin Time: 13.1 seconds (ref 11.6–15.2)

## 2011-04-09 LAB — APTT: aPTT: 28 seconds (ref 24–37)

## 2011-06-15 ENCOUNTER — Other Ambulatory Visit: Payer: Self-pay | Admitting: Neurosurgery

## 2011-06-15 DIAGNOSIS — M47816 Spondylosis without myelopathy or radiculopathy, lumbar region: Secondary | ICD-10-CM

## 2011-06-16 ENCOUNTER — Ambulatory Visit
Admission: RE | Admit: 2011-06-16 | Discharge: 2011-06-16 | Disposition: A | Discharge status: Home / Self Care | Payer: BC Managed Care – PPO | Source: Ambulatory Visit | Attending: Neurosurgery | Admitting: Neurosurgery

## 2011-06-16 DIAGNOSIS — M47816 Spondylosis without myelopathy or radiculopathy, lumbar region: Secondary | ICD-10-CM

## 2011-06-17 ENCOUNTER — Other Ambulatory Visit: Payer: Self-pay

## 2013-01-30 ENCOUNTER — Other Ambulatory Visit: Payer: Self-pay | Admitting: Neurosurgery

## 2013-01-30 DIAGNOSIS — M47816 Spondylosis without myelopathy or radiculopathy, lumbar region: Secondary | ICD-10-CM

## 2013-02-05 ENCOUNTER — Ambulatory Visit
Admission: RE | Admit: 2013-02-05 | Discharge: 2013-02-05 | Disposition: A | Discharge status: Home / Self Care | Payer: BC Managed Care – PPO | Source: Ambulatory Visit | Attending: Neurosurgery | Admitting: Neurosurgery

## 2013-02-05 DIAGNOSIS — M47816 Spondylosis without myelopathy or radiculopathy, lumbar region: Secondary | ICD-10-CM

## 2013-02-05 MED ORDER — GADOBENATE DIMEGLUMINE 529 MG/ML IV SOLN
20.0000 mL | Freq: Once | INTRAVENOUS | Status: AC | PRN
  Administered 2013-02-05: 20 mL via INTRAVENOUS

## 2014-05-22 DIAGNOSIS — G4733 Obstructive sleep apnea (adult) (pediatric): Secondary | ICD-10-CM | POA: Insufficient documentation

## 2014-05-22 DIAGNOSIS — I10 Essential (primary) hypertension: Secondary | ICD-10-CM | POA: Insufficient documentation

## 2014-06-14 ENCOUNTER — Other Ambulatory Visit: Payer: Self-pay | Admitting: Neurosurgery

## 2014-06-14 DIAGNOSIS — M4302 Spondylolysis, cervical region: Secondary | ICD-10-CM

## 2014-06-17 ENCOUNTER — Other Ambulatory Visit: Payer: BC Managed Care – PPO

## 2014-06-17 ENCOUNTER — Ambulatory Visit
Admission: RE | Admit: 2014-06-17 | Discharge: 2014-06-17 | Disposition: A | Discharge status: Home / Self Care | Payer: BC Managed Care – PPO | Source: Ambulatory Visit | Attending: Neurosurgery | Admitting: Neurosurgery

## 2014-06-17 DIAGNOSIS — M4302 Spondylolysis, cervical region: Secondary | ICD-10-CM

## 2015-06-23 DIAGNOSIS — E1169 Type 2 diabetes mellitus with other specified complication: Secondary | ICD-10-CM | POA: Insufficient documentation

## 2015-06-23 DIAGNOSIS — E785 Hyperlipidemia, unspecified: Secondary | ICD-10-CM | POA: Insufficient documentation

## 2015-09-12 ENCOUNTER — Encounter: Payer: Self-pay | Admitting: Radiation Oncology

## 2015-09-12 ENCOUNTER — Ambulatory Visit
Admission: RE | Admit: 2015-09-12 | Discharge: 2015-09-12 | Disposition: A | Discharge status: Home / Self Care | Payer: BLUE CROSS/BLUE SHIELD | Source: Ambulatory Visit | Attending: Radiation Oncology | Admitting: Radiation Oncology

## 2015-09-12 VITALS — BP 147/79 | HR 69 | Temp 97.1°F | Resp 20 | Ht 69.0 in | Wt 284.9 lb

## 2015-09-12 DIAGNOSIS — Z510 Encounter for antineoplastic radiation therapy: Secondary | ICD-10-CM | POA: Insufficient documentation

## 2015-09-12 DIAGNOSIS — C61 Malignant neoplasm of prostate: Secondary | ICD-10-CM | POA: Insufficient documentation

## 2015-09-12 HISTORY — DX: Malignant neoplasm of prostate: C61

## 2015-09-12 HISTORY — DX: Sleep apnea, unspecified: G47.30

## 2015-09-12 HISTORY — DX: Essential (primary) hypertension: I10

## 2015-09-12 NOTE — Consult Note (Signed)
Except an outstanding is perfect of Radiation Oncology NEW PATIENT EVALUATION  Name: Alexander Wilcox  MRN: [id number]  Date:   09/12/2015     DOB: 1950-12-15   This 65 y.o. 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  REFERRING PHYSICIAN: Royston Cowper, MD  CHIEF COMPLAINT:  Chief Complaint  Patient presents with  . Prostate Cancer    Pt is here for initial consultation of prostate cancer.      DIAGNOSIS: The encounter diagnosis was Malignant neoplasm of prostate ([place]).   PREVIOUS INVESTIGATIONS:  Pathology reports reviewed Clinical notes reviewed  HPI: Patient is a 65 year old male who is status postHIFU 2 for adenocarcinoma the prostate. Starting back in 2010 with retreatment in 2014. He has very little urinary symptoms. His PSA has been rising as gone from January 2016 a 1.42 now 3.6. He had repeat biopsy showing a single focus of Gleason 9 (4+5) adenocarcinoma. He is seen today for salvage treatment. He again continues to do well very little urgency frequency or nocturia. 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 Bone scan has not been performed based on low PSA result. I've asked to evaluate him for salvage treatment.  PLANNED TREATMENT REGIMEN: I MRT image guided radiation therapy  PAST MEDICAL HISTORY:  has a past medical history of Hypertension; Prostate cancer ([place]); and Sleep apnea.    PAST SURGICAL HISTORY:  Past Surgical History  Procedure Laterality Date  . Appendectomy    . Back surgery      FAMILY HISTORY: family history is not on file.  SOCIAL HISTORY:  reports that he has quit smoking. He has never used smokeless tobacco. He reports that he drinks alcohol.  ALLERGIES: Review of patient's allergies indicates no known allergies.  MEDICATIONS:  Current Outpatient  Prescriptions  Medication Sig Dispense Refill  . cyclobenzaprine (FLEXERIL) 10 MG tablet Take by mouth.    . DULoxetine (CYMBALTA) 30 MG capsule Take by mouth.    [name] HYDROcodone-acetaminophen (NORCO/VICODIN) 5-325 MG tablet Take by mouth.    . metoprolol succinate (TOPROL-XL) 25 MG 24 hr tablet Take by mouth.    . quinapril-hydrochlorothiazide (ACCURETIC) 10-12.5 MG tablet Take by mouth.    . sulfaSALAzine (AZULFIDINE) 500 MG EC tablet Take by mouth. Reported on 09/12/2015     No current facility-administered medications for this encounter.    ECOG PERFORMANCE STATUS:  0 - Asymptomatic  REVIEW OF SYSTEMS: Except for problems with significant advanced arthritis Patient denies any weight loss, fatigue, weakness, fever, chills or night sweats. Patient denies any loss of vision, blurred vision. Patient denies any ringing  of the ears or hearing loss. No irregular heartbeat. Patient denies heart murmur or history of fainting. Patient denies any chest pain or pain radiating to her upper extremities. Patient denies any shortness of breath, difficulty breathing at night, cough or hemoptysis. Patient denies any swelling in the lower legs. Patient denies any nausea vomiting, vomiting of blood, or coffee ground material in the vomitus. Patient denies any stomach pain. Patient states has had normal bowel movements no significant constipation or diarrhea. Patient denies any dysuria, hematuria or significant nocturia. Patient denies any problems walking, swelling in the joints or loss of balance. Patient denies any skin changes, loss of hair or loss of weight. Patient denies any excessive worrying  or anxiety or significant depression. Patient denies any problems with insomnia. Patient denies excessive thirst, polyuria, polydipsia. Patient denies any swollen glands, patient denies easy bruising or easy bleeding. Patient denies any recent infections, allergies or URI. Patient "s visual fields have not changed  significantly in recent time.    PHYSICAL EXAM: BP 147/79 mmHg  Pulse 69  Temp(Src) 97.1 F (36.2 C)  Resp 20  Ht 5\' 9"  (1.753 m)  Wt 284 lb 15.1 oz (129.25 kg)  BMI 42.06 kg/m2 On rectal exam rectal sphincter tone is good prostate is markedly decreased in size firm with indistinct sulcus bilaterally. No other rectal abnormalities identified. Well-developed well-nourished patient in NAD. HEENT reveals PERLA, EOMI, discs not visualized.  Oral cavity is clear. No oral mucosal lesions are identified. Neck is clear without evidence of cervical or supraclavicular adenopathy. Lungs are clear to A&P. Cardiac examination is essentially unremarkable with regular rate and rhythm without murmur rub or thrill. Abdomen is benign with no organomegaly or masses noted. Motor sensory and DTR levels are equal and symmetric in the upper and lower extremities. Cranial nerves II through XII are grossly intact. Proprioception is intact. No peripheral adenopathy or edema is identified. No motor or sensory levels are noted. Crude visual fields are within normal range.  LABORATORY DATA: Pathology reports reviewed    RADIOLOGY RESULTS: No bone scan or CT scans for review   IMPRESSION: Stage IIB recurrent high-grade adenocarcinoma the prostate in patient status postHIFU 2  PLAN: At this time I recommended radiation therapy to his prostate and pelvic nodes. I have performed the [facility] nomogram showing a probability of 64% of extracapsular extension and 16% of lymph node involvement. Based on his findings would recommend I am RT treatment planning and delivery to both his prostate and pelvic nodes. Would treat his prostate 8000 cGy and his pelvic knows to 5400 cGy using I am RT dose painting technique. Based on the high-grade nature of disease also would like him suppressed on Lupron for 1-1/2 years and will ask urologist to start that treatment. I've also asked urology to place gold markers for daily  image guided treatment. I have set up and ordered CT simulation after the markers are placed. Risks and benefits of treatment including increased lower urinary tract symptoms, fatigue, alteration of blood counts, diarrhea, skin reaction all were described in detail to the patient. Patient also may have exacerbation of his erectile dysfunction.  I would like to take this opportunity for allowing me to participate in the care of your patient.Armstead Peaks., MD

## 2015-09-12 NOTE — Progress Notes (Signed)
  Oncology Nurse Navigator Documentation  Navigator Location: [facility] (09/12/15 1100) Navigator Encounter Type: Initial RadOnc (09/12/15 1100)     Confirmed Diagnosis Date: 08/27/15 (09/12/15 1100)   Treatment Initiated Date:  (simulation scheduled for 3/28) (09/12/15 1100) Patient Visit Type: RadOnc (09/12/15 1100) Treatment Phase: Pre-Tx/Tx Discussion (09/12/15 1100)                  Acuity: Level 1 (09/12/15 1100) Acuity Level 1: Initial guidance, education and coordination as needed;Minimal follow up required (09/12/15 1100)           Met with patient and spouse during consultation with Dr Baruch Gouty. Introduced nurse navigator servies and provided my contact information for any further questions or needs.

## 2015-09-30 ENCOUNTER — Ambulatory Visit
Admission: RE | Admit: 2015-09-30 | Discharge: 2015-09-30 | Disposition: A | Discharge status: Home / Self Care | Payer: BLUE CROSS/BLUE SHIELD | Source: Ambulatory Visit | Attending: Radiation Oncology | Admitting: Radiation Oncology

## 2015-09-30 DIAGNOSIS — C61 Malignant neoplasm of prostate: Secondary | ICD-10-CM | POA: Diagnosis present

## 2015-09-30 DIAGNOSIS — Z510 Encounter for antineoplastic radiation therapy: Secondary | ICD-10-CM | POA: Diagnosis not present

## 2015-10-03 ENCOUNTER — Other Ambulatory Visit: Payer: Self-pay | Admitting: *Deleted

## 2015-10-03 DIAGNOSIS — C61 Malignant neoplasm of prostate: Secondary | ICD-10-CM

## 2015-10-06 ENCOUNTER — Telehealth: Payer: Self-pay

## 2015-10-06 DIAGNOSIS — C61 Malignant neoplasm of prostate: Secondary | ICD-10-CM | POA: Diagnosis not present

## 2015-10-06 NOTE — Telephone Encounter (Signed)
  Oncology Nurse Navigator Documentation  Navigator Location: [facility] (10/06/15 0900) Navigator Encounter Type: Telephone (10/06/15 0900) Telephone: Education (10/06/15 0900)             Barriers/Navigation Needs: Education (10/06/15 0900)   Interventions: Education Method (10/06/15 0900)     Education Method: Verbal (10/06/15 0900)                Time Spent with Patient: 15 (10/06/15 0900)   Received call from spouse [place]. She was asking if Mr Creson could be out in the sun. Instructed that he could but he should use sunscreen for protection. Aking if he could get a tattoo, instructed he could. Asking about missing some appts. Instructed that it is the total amount of radiation and days missed would be added on at the end. Will defer to Dr Baruch Gouty as to how many days is appropriate to miss together.

## 2015-10-08 DIAGNOSIS — C61 Malignant neoplasm of prostate: Secondary | ICD-10-CM | POA: Diagnosis not present

## 2015-10-09 ENCOUNTER — Ambulatory Visit
Admission: RE | Admit: 2015-10-09 | Discharge: 2015-10-09 | Disposition: A | Discharge status: Home / Self Care | Payer: BLUE CROSS/BLUE SHIELD | Source: Ambulatory Visit | Attending: Radiation Oncology | Admitting: Radiation Oncology

## 2015-10-13 ENCOUNTER — Ambulatory Visit
Admission: RE | Admit: 2015-10-13 | Discharge: 2015-10-13 | Disposition: A | Discharge status: Home / Self Care | Payer: BLUE CROSS/BLUE SHIELD | Source: Ambulatory Visit | Attending: Radiation Oncology | Admitting: Radiation Oncology

## 2015-10-13 DIAGNOSIS — C61 Malignant neoplasm of prostate: Secondary | ICD-10-CM | POA: Diagnosis not present

## 2015-10-14 ENCOUNTER — Ambulatory Visit
Admission: RE | Admit: 2015-10-14 | Discharge: 2015-10-14 | Disposition: A | Discharge status: Home / Self Care | Payer: BLUE CROSS/BLUE SHIELD | Source: Ambulatory Visit | Attending: Radiation Oncology | Admitting: Radiation Oncology

## 2015-10-14 DIAGNOSIS — C61 Malignant neoplasm of prostate: Secondary | ICD-10-CM | POA: Diagnosis not present

## 2015-10-15 ENCOUNTER — Ambulatory Visit
Admission: RE | Admit: 2015-10-15 | Discharge: 2015-10-15 | Disposition: A | Discharge status: Home / Self Care | Payer: BLUE CROSS/BLUE SHIELD | Source: Ambulatory Visit | Attending: Radiation Oncology | Admitting: Radiation Oncology

## 2015-10-15 DIAGNOSIS — C61 Malignant neoplasm of prostate: Secondary | ICD-10-CM | POA: Diagnosis not present

## 2015-10-16 ENCOUNTER — Ambulatory Visit
Admission: RE | Admit: 2015-10-16 | Discharge: 2015-10-16 | Disposition: A | Discharge status: Home / Self Care | Payer: BLUE CROSS/BLUE SHIELD | Source: Ambulatory Visit | Attending: Radiation Oncology | Admitting: Radiation Oncology

## 2015-10-16 DIAGNOSIS — C61 Malignant neoplasm of prostate: Secondary | ICD-10-CM | POA: Diagnosis not present

## 2015-10-17 ENCOUNTER — Ambulatory Visit
Admission: RE | Admit: 2015-10-17 | Discharge: 2015-10-17 | Disposition: A | Discharge status: Home / Self Care | Payer: BLUE CROSS/BLUE SHIELD | Source: Ambulatory Visit | Attending: Radiation Oncology | Admitting: Radiation Oncology

## 2015-10-17 DIAGNOSIS — C61 Malignant neoplasm of prostate: Secondary | ICD-10-CM | POA: Diagnosis not present

## 2015-10-20 ENCOUNTER — Ambulatory Visit
Admission: RE | Admit: 2015-10-20 | Discharge: 2015-10-20 | Disposition: A | Discharge status: Home / Self Care | Payer: BLUE CROSS/BLUE SHIELD | Source: Ambulatory Visit | Attending: Radiation Oncology | Admitting: Radiation Oncology

## 2015-10-20 DIAGNOSIS — C61 Malignant neoplasm of prostate: Secondary | ICD-10-CM | POA: Diagnosis not present

## 2015-10-21 ENCOUNTER — Ambulatory Visit
Admission: RE | Admit: 2015-10-21 | Discharge: 2015-10-21 | Disposition: A | Discharge status: Home / Self Care | Payer: BLUE CROSS/BLUE SHIELD | Source: Ambulatory Visit | Attending: Radiation Oncology | Admitting: Radiation Oncology

## 2015-10-21 DIAGNOSIS — C61 Malignant neoplasm of prostate: Secondary | ICD-10-CM | POA: Diagnosis not present

## 2015-10-22 ENCOUNTER — Ambulatory Visit
Admission: RE | Admit: 2015-10-22 | Discharge: 2015-10-22 | Disposition: A | Discharge status: Home / Self Care | Payer: BLUE CROSS/BLUE SHIELD | Source: Ambulatory Visit | Attending: Radiation Oncology | Admitting: Radiation Oncology

## 2015-10-22 DIAGNOSIS — C61 Malignant neoplasm of prostate: Secondary | ICD-10-CM | POA: Diagnosis not present

## 2015-10-23 ENCOUNTER — Ambulatory Visit
Admission: RE | Admit: 2015-10-23 | Discharge: 2015-10-23 | Disposition: A | Discharge status: Home / Self Care | Payer: BLUE CROSS/BLUE SHIELD | Source: Ambulatory Visit | Attending: Radiation Oncology | Admitting: Radiation Oncology

## 2015-10-23 DIAGNOSIS — C61 Malignant neoplasm of prostate: Secondary | ICD-10-CM | POA: Diagnosis not present

## 2015-10-24 ENCOUNTER — Ambulatory Visit
Admission: RE | Admit: 2015-10-24 | Discharge: 2015-10-24 | Disposition: A | Discharge status: Home / Self Care | Payer: BLUE CROSS/BLUE SHIELD | Source: Ambulatory Visit | Attending: Radiation Oncology | Admitting: Radiation Oncology

## 2015-10-24 ENCOUNTER — Inpatient Hospital Stay: Payer: BLUE CROSS/BLUE SHIELD | Attending: Radiation Oncology

## 2015-10-24 DIAGNOSIS — C61 Malignant neoplasm of prostate: Secondary | ICD-10-CM | POA: Diagnosis not present

## 2015-10-27 ENCOUNTER — Ambulatory Visit
Admission: RE | Admit: 2015-10-27 | Discharge: 2015-10-27 | Disposition: A | Discharge status: Home / Self Care | Payer: BLUE CROSS/BLUE SHIELD | Source: Ambulatory Visit | Attending: Radiation Oncology | Admitting: Radiation Oncology

## 2015-10-27 DIAGNOSIS — C61 Malignant neoplasm of prostate: Secondary | ICD-10-CM | POA: Diagnosis not present

## 2015-10-28 ENCOUNTER — Ambulatory Visit
Admission: RE | Admit: 2015-10-28 | Discharge: 2015-10-28 | Disposition: A | Discharge status: Home / Self Care | Payer: BLUE CROSS/BLUE SHIELD | Source: Ambulatory Visit | Attending: Radiation Oncology | Admitting: Radiation Oncology

## 2015-10-28 DIAGNOSIS — C61 Malignant neoplasm of prostate: Secondary | ICD-10-CM | POA: Diagnosis not present

## 2015-10-29 ENCOUNTER — Ambulatory Visit
Admission: RE | Admit: 2015-10-29 | Discharge: 2015-10-29 | Disposition: A | Discharge status: Home / Self Care | Payer: BLUE CROSS/BLUE SHIELD | Source: Ambulatory Visit | Attending: Radiation Oncology | Admitting: Radiation Oncology

## 2015-10-29 DIAGNOSIS — C61 Malignant neoplasm of prostate: Secondary | ICD-10-CM | POA: Diagnosis not present

## 2015-10-30 ENCOUNTER — Ambulatory Visit
Admission: RE | Admit: 2015-10-30 | Discharge: 2015-10-30 | Disposition: A | Discharge status: Home / Self Care | Payer: BLUE CROSS/BLUE SHIELD | Source: Ambulatory Visit | Attending: Radiation Oncology | Admitting: Radiation Oncology

## 2015-10-30 DIAGNOSIS — C61 Malignant neoplasm of prostate: Secondary | ICD-10-CM | POA: Diagnosis not present

## 2015-10-31 ENCOUNTER — Ambulatory Visit
Admission: RE | Admit: 2015-10-31 | Discharge: 2015-10-31 | Disposition: A | Discharge status: Home / Self Care | Payer: BLUE CROSS/BLUE SHIELD | Source: Ambulatory Visit | Attending: Radiation Oncology | Admitting: Radiation Oncology

## 2015-10-31 DIAGNOSIS — C61 Malignant neoplasm of prostate: Secondary | ICD-10-CM | POA: Diagnosis not present

## 2015-11-03 ENCOUNTER — Ambulatory Visit
Admission: RE | Admit: 2015-11-03 | Discharge: 2015-11-03 | Disposition: A | Discharge status: Home / Self Care | Payer: BLUE CROSS/BLUE SHIELD | Source: Ambulatory Visit | Attending: Radiation Oncology | Admitting: Radiation Oncology

## 2015-11-03 DIAGNOSIS — C61 Malignant neoplasm of prostate: Secondary | ICD-10-CM | POA: Diagnosis not present

## 2015-11-04 ENCOUNTER — Ambulatory Visit
Admission: RE | Admit: 2015-11-04 | Discharge: 2015-11-04 | Disposition: A | Discharge status: Home / Self Care | Payer: BLUE CROSS/BLUE SHIELD | Source: Ambulatory Visit | Attending: Radiation Oncology | Admitting: Radiation Oncology

## 2015-11-04 DIAGNOSIS — C61 Malignant neoplasm of prostate: Secondary | ICD-10-CM | POA: Diagnosis not present

## 2015-11-05 ENCOUNTER — Ambulatory Visit
Admission: RE | Admit: 2015-11-05 | Discharge: 2015-11-05 | Disposition: A | Discharge status: Home / Self Care | Payer: BLUE CROSS/BLUE SHIELD | Source: Ambulatory Visit | Attending: Radiation Oncology | Admitting: Radiation Oncology

## 2015-11-05 DIAGNOSIS — C61 Malignant neoplasm of prostate: Secondary | ICD-10-CM | POA: Diagnosis not present

## 2015-11-06 ENCOUNTER — Ambulatory Visit
Admission: RE | Admit: 2015-11-06 | Discharge: 2015-11-06 | Disposition: A | Discharge status: Home / Self Care | Payer: BLUE CROSS/BLUE SHIELD | Source: Ambulatory Visit | Attending: Radiation Oncology | Admitting: Radiation Oncology

## 2015-11-06 DIAGNOSIS — C61 Malignant neoplasm of prostate: Secondary | ICD-10-CM | POA: Diagnosis not present

## 2015-11-07 ENCOUNTER — Inpatient Hospital Stay: Payer: BLUE CROSS/BLUE SHIELD | Attending: Radiation Oncology

## 2015-11-07 ENCOUNTER — Ambulatory Visit
Admission: RE | Admit: 2015-11-07 | Discharge: 2015-11-07 | Disposition: A | Discharge status: Home / Self Care | Payer: BLUE CROSS/BLUE SHIELD | Source: Ambulatory Visit | Attending: Radiation Oncology | Admitting: Radiation Oncology

## 2015-11-07 DIAGNOSIS — Z79899 Other long term (current) drug therapy: Secondary | ICD-10-CM | POA: Diagnosis not present

## 2015-11-07 DIAGNOSIS — C61 Malignant neoplasm of prostate: Secondary | ICD-10-CM | POA: Diagnosis present

## 2015-11-07 LAB — CBC
HEMATOCRIT: 44.9 % (ref 40.0–52.0)
HEMOGLOBIN: 16.2 g/dL (ref 13.0–18.0)
MCH: 33.1 pg (ref 26.0–34.0)
MCHC: 36.1 g/dL — AB (ref 32.0–36.0)
MCV: 91.5 fL (ref 80.0–100.0)
Platelets: 109 10*3/uL — ABNORMAL LOW (ref 150–440)
RBC: 4.91 MIL/uL (ref 4.40–5.90)
RDW: 14.3 % (ref 11.5–14.5)
WBC: 5.3 10*3/uL (ref 3.8–10.6)

## 2015-11-10 ENCOUNTER — Ambulatory Visit
Admission: RE | Admit: 2015-11-10 | Discharge: 2015-11-10 | Disposition: A | Discharge status: Home / Self Care | Payer: BLUE CROSS/BLUE SHIELD | Source: Ambulatory Visit | Attending: Radiation Oncology | Admitting: Radiation Oncology

## 2015-11-10 DIAGNOSIS — C61 Malignant neoplasm of prostate: Secondary | ICD-10-CM | POA: Diagnosis not present

## 2015-11-11 ENCOUNTER — Ambulatory Visit
Admission: RE | Admit: 2015-11-11 | Discharge: 2015-11-11 | Disposition: A | Discharge status: Home / Self Care | Payer: BLUE CROSS/BLUE SHIELD | Source: Ambulatory Visit | Attending: Radiation Oncology | Admitting: Radiation Oncology

## 2015-11-11 DIAGNOSIS — C61 Malignant neoplasm of prostate: Secondary | ICD-10-CM | POA: Diagnosis not present

## 2015-11-12 ENCOUNTER — Ambulatory Visit
Admission: RE | Admit: 2015-11-12 | Discharge: 2015-11-12 | Disposition: A | Discharge status: Home / Self Care | Payer: BLUE CROSS/BLUE SHIELD | Source: Ambulatory Visit | Attending: Radiation Oncology | Admitting: Radiation Oncology

## 2015-11-12 DIAGNOSIS — C61 Malignant neoplasm of prostate: Secondary | ICD-10-CM | POA: Diagnosis not present

## 2015-11-13 ENCOUNTER — Ambulatory Visit
Admission: RE | Admit: 2015-11-13 | Discharge: 2015-11-13 | Disposition: A | Discharge status: Home / Self Care | Payer: BLUE CROSS/BLUE SHIELD | Source: Ambulatory Visit | Attending: Radiation Oncology | Admitting: Radiation Oncology

## 2015-11-13 DIAGNOSIS — C61 Malignant neoplasm of prostate: Secondary | ICD-10-CM | POA: Diagnosis not present

## 2015-11-14 ENCOUNTER — Ambulatory Visit
Admission: RE | Admit: 2015-11-14 | Discharge: 2015-11-14 | Disposition: A | Discharge status: Home / Self Care | Payer: BLUE CROSS/BLUE SHIELD | Source: Ambulatory Visit | Attending: Radiation Oncology | Admitting: Radiation Oncology

## 2015-11-14 DIAGNOSIS — C61 Malignant neoplasm of prostate: Secondary | ICD-10-CM | POA: Diagnosis not present

## 2015-11-17 ENCOUNTER — Ambulatory Visit
Admission: RE | Admit: 2015-11-17 | Discharge: 2015-11-17 | Disposition: A | Discharge status: Home / Self Care | Payer: BLUE CROSS/BLUE SHIELD | Source: Ambulatory Visit | Attending: Radiation Oncology | Admitting: Radiation Oncology

## 2015-11-17 DIAGNOSIS — C61 Malignant neoplasm of prostate: Secondary | ICD-10-CM | POA: Diagnosis not present

## 2015-11-18 ENCOUNTER — Ambulatory Visit
Admission: RE | Admit: 2015-11-18 | Discharge: 2015-11-18 | Disposition: A | Discharge status: Home / Self Care | Payer: BLUE CROSS/BLUE SHIELD | Source: Ambulatory Visit | Attending: Radiation Oncology | Admitting: Radiation Oncology

## 2015-11-18 DIAGNOSIS — C61 Malignant neoplasm of prostate: Secondary | ICD-10-CM | POA: Diagnosis not present

## 2015-11-19 ENCOUNTER — Ambulatory Visit
Admission: RE | Admit: 2015-11-19 | Discharge: 2015-11-19 | Disposition: A | Discharge status: Home / Self Care | Payer: BLUE CROSS/BLUE SHIELD | Source: Ambulatory Visit | Attending: Radiation Oncology | Admitting: Radiation Oncology

## 2015-11-19 DIAGNOSIS — C61 Malignant neoplasm of prostate: Secondary | ICD-10-CM | POA: Diagnosis not present

## 2015-11-20 ENCOUNTER — Ambulatory Visit
Admission: RE | Admit: 2015-11-20 | Discharge: 2015-11-20 | Disposition: A | Discharge status: Home / Self Care | Payer: BLUE CROSS/BLUE SHIELD | Source: Ambulatory Visit | Attending: Radiation Oncology | Admitting: Radiation Oncology

## 2015-11-20 DIAGNOSIS — C61 Malignant neoplasm of prostate: Secondary | ICD-10-CM | POA: Diagnosis not present

## 2015-11-21 ENCOUNTER — Inpatient Hospital Stay: Payer: BLUE CROSS/BLUE SHIELD

## 2015-11-21 ENCOUNTER — Ambulatory Visit
Admission: RE | Admit: 2015-11-21 | Discharge: 2015-11-21 | Disposition: A | Discharge status: Home / Self Care | Payer: BLUE CROSS/BLUE SHIELD | Source: Ambulatory Visit | Attending: Radiation Oncology | Admitting: Radiation Oncology

## 2015-11-21 DIAGNOSIS — C61 Malignant neoplasm of prostate: Secondary | ICD-10-CM | POA: Diagnosis not present

## 2015-11-21 LAB — CBC
HCT: 41.5 % (ref 40.0–52.0)
Hemoglobin: 14.9 g/dL (ref 13.0–18.0)
MCH: 32.8 pg (ref 26.0–34.0)
MCHC: 35.9 g/dL (ref 32.0–36.0)
MCV: 91.4 fL (ref 80.0–100.0)
PLATELETS: 133 10*3/uL — AB (ref 150–440)
RBC: 4.54 MIL/uL (ref 4.40–5.90)
RDW: 15.1 % — ABNORMAL HIGH (ref 11.5–14.5)
WBC: 4.7 10*3/uL (ref 3.8–10.6)

## 2015-11-24 ENCOUNTER — Ambulatory Visit
Admission: RE | Admit: 2015-11-24 | Discharge: 2015-11-24 | Disposition: A | Discharge status: Home / Self Care | Payer: BLUE CROSS/BLUE SHIELD | Source: Ambulatory Visit | Attending: Radiation Oncology | Admitting: Radiation Oncology

## 2015-11-24 DIAGNOSIS — C61 Malignant neoplasm of prostate: Secondary | ICD-10-CM | POA: Diagnosis not present

## 2015-11-25 ENCOUNTER — Other Ambulatory Visit: Payer: Self-pay | Admitting: *Deleted

## 2015-11-25 ENCOUNTER — Ambulatory Visit
Admission: RE | Admit: 2015-11-25 | Discharge: 2015-11-25 | Disposition: A | Discharge status: Home / Self Care | Payer: BLUE CROSS/BLUE SHIELD | Source: Ambulatory Visit | Attending: Radiation Oncology | Admitting: Radiation Oncology

## 2015-11-25 DIAGNOSIS — C61 Malignant neoplasm of prostate: Secondary | ICD-10-CM | POA: Diagnosis not present

## 2015-11-25 MED ORDER — TAMSULOSIN HCL 0.4 MG PO CAPS: 0.4000 mg | ORAL_CAPSULE | Freq: Every day | ORAL | Status: DC

## 2015-11-26 ENCOUNTER — Ambulatory Visit
Admission: RE | Admit: 2015-11-26 | Discharge: 2015-11-26 | Disposition: A | Discharge status: Home / Self Care | Payer: BLUE CROSS/BLUE SHIELD | Source: Ambulatory Visit | Attending: Radiation Oncology | Admitting: Radiation Oncology

## 2015-11-26 ENCOUNTER — Ambulatory Visit: Payer: BLUE CROSS/BLUE SHIELD

## 2015-11-26 DIAGNOSIS — C61 Malignant neoplasm of prostate: Secondary | ICD-10-CM | POA: Diagnosis not present

## 2015-11-27 ENCOUNTER — Ambulatory Visit: Payer: BLUE CROSS/BLUE SHIELD

## 2015-11-28 ENCOUNTER — Ambulatory Visit: Payer: BLUE CROSS/BLUE SHIELD

## 2015-12-01 ENCOUNTER — Ambulatory Visit: Payer: BLUE CROSS/BLUE SHIELD

## 2015-12-02 ENCOUNTER — Ambulatory Visit
Admission: RE | Admit: 2015-12-02 | Discharge: 2015-12-02 | Disposition: A | Discharge status: Home / Self Care | Payer: BLUE CROSS/BLUE SHIELD | Source: Ambulatory Visit | Attending: Radiation Oncology | Admitting: Radiation Oncology

## 2015-12-02 DIAGNOSIS — C61 Malignant neoplasm of prostate: Secondary | ICD-10-CM | POA: Diagnosis not present

## 2015-12-03 ENCOUNTER — Ambulatory Visit
Admission: RE | Admit: 2015-12-03 | Discharge: 2015-12-03 | Disposition: A | Discharge status: Home / Self Care | Payer: BLUE CROSS/BLUE SHIELD | Source: Ambulatory Visit | Attending: Radiation Oncology | Admitting: Radiation Oncology

## 2015-12-03 DIAGNOSIS — C61 Malignant neoplasm of prostate: Secondary | ICD-10-CM | POA: Diagnosis not present

## 2015-12-04 ENCOUNTER — Ambulatory Visit
Admission: RE | Admit: 2015-12-04 | Discharge: 2015-12-04 | Disposition: A | Discharge status: Home / Self Care | Payer: BLUE CROSS/BLUE SHIELD | Source: Ambulatory Visit | Attending: Radiation Oncology | Admitting: Radiation Oncology

## 2015-12-04 DIAGNOSIS — C61 Malignant neoplasm of prostate: Secondary | ICD-10-CM | POA: Diagnosis not present

## 2015-12-05 ENCOUNTER — Ambulatory Visit
Admission: RE | Admit: 2015-12-05 | Discharge: 2015-12-05 | Disposition: A | Discharge status: Home / Self Care | Payer: BLUE CROSS/BLUE SHIELD | Source: Ambulatory Visit | Attending: Radiation Oncology | Admitting: Radiation Oncology

## 2015-12-05 DIAGNOSIS — C61 Malignant neoplasm of prostate: Secondary | ICD-10-CM | POA: Diagnosis not present

## 2015-12-08 ENCOUNTER — Ambulatory Visit
Admission: RE | Admit: 2015-12-08 | Discharge: 2015-12-08 | Disposition: A | Discharge status: Home / Self Care | Payer: BLUE CROSS/BLUE SHIELD | Source: Ambulatory Visit | Attending: Radiation Oncology | Admitting: Radiation Oncology

## 2015-12-08 DIAGNOSIS — C61 Malignant neoplasm of prostate: Secondary | ICD-10-CM | POA: Diagnosis not present

## 2015-12-09 ENCOUNTER — Ambulatory Visit
Admission: RE | Admit: 2015-12-09 | Discharge: 2015-12-09 | Disposition: A | Discharge status: Home / Self Care | Payer: BLUE CROSS/BLUE SHIELD | Source: Ambulatory Visit | Attending: Radiation Oncology | Admitting: Radiation Oncology

## 2015-12-09 DIAGNOSIS — C61 Malignant neoplasm of prostate: Secondary | ICD-10-CM | POA: Diagnosis not present

## 2015-12-10 ENCOUNTER — Ambulatory Visit
Admission: RE | Admit: 2015-12-10 | Discharge: 2015-12-10 | Disposition: A | Discharge status: Home / Self Care | Payer: BLUE CROSS/BLUE SHIELD | Source: Ambulatory Visit | Attending: Radiation Oncology | Admitting: Radiation Oncology

## 2015-12-10 DIAGNOSIS — C61 Malignant neoplasm of prostate: Secondary | ICD-10-CM | POA: Diagnosis not present

## 2015-12-11 ENCOUNTER — Ambulatory Visit: Payer: BLUE CROSS/BLUE SHIELD

## 2016-01-22 ENCOUNTER — Encounter: Payer: Self-pay | Admitting: Radiation Oncology

## 2016-01-22 ENCOUNTER — Ambulatory Visit
Admission: RE | Admit: 2016-01-22 | Discharge: 2016-01-22 | Disposition: A | Discharge status: Home / Self Care | Payer: BLUE CROSS/BLUE SHIELD | Source: Ambulatory Visit | Attending: Radiation Oncology | Admitting: Radiation Oncology

## 2016-01-22 VITALS — BP 126/75 | HR 78 | Temp 96.8°F | Resp 18 | Wt 284.3 lb

## 2016-01-22 DIAGNOSIS — R197 Diarrhea, unspecified: Secondary | ICD-10-CM | POA: Diagnosis not present

## 2016-01-22 DIAGNOSIS — Z923 Personal history of irradiation: Secondary | ICD-10-CM | POA: Diagnosis not present

## 2016-01-22 DIAGNOSIS — C61 Malignant neoplasm of prostate: Secondary | ICD-10-CM | POA: Diagnosis not present

## 2016-01-22 DIAGNOSIS — R350 Frequency of micturition: Secondary | ICD-10-CM | POA: Insufficient documentation

## 2016-01-22 NOTE — Progress Notes (Signed)
Radiation Oncology Follow up Note  Name: Alexander Wilcox   Date:   01/22/2016 MRN:  [id number] DOB: June 27, 1951    This 65 y.o. male presents to the clinic today for one-month follow-up for stage IIB adenocarcinoma the prostate.  REFERRING PROVIDER: Royston Cowper, MD  HPI: Patient is a 65 year old male now one month out from salvage radiation therapy for stage IIB (T1 CN 0 M0) adenocarcinoma the prostate Gleason score of 9 (4+5) presenting the PSA of 3.6 status post HIFU 2. Seen today in routine follow-up he is doing fairly well still has mild intermittent diarrhea doesn't take occasional Imodium. Does not follow any low residue diet. He is passing his water well has nocturia 1. Does have some frequency and urgency.  COMPLICATIONS OF TREATMENT: none  FOLLOW UP COMPLIANCE: keeps appointments   PHYSICAL EXAM:  BP 126/75 mmHg  Pulse 78  Temp(Src) 96.8 F (36 C) (Tympanic)  Resp 18  Wt 284 lb 4.5 oz (128.95 kg) On rectal exam rectal sphincter tone is good. Prostate is smooth contracted without evidence of nodularity or mass. Sulcus is preserved bilaterally. No discrete nodularity is identified. No other rectal abnormalities are noted. Well-developed well-nourished patient in NAD. HEENT reveals PERLA, EOMI, discs not visualized.  Oral cavity is clear. No oral mucosal lesions are identified. Neck is clear without evidence of cervical or supraclavicular adenopathy. Lungs are clear to A&P. Cardiac examination is essentially unremarkable with regular rate and rhythm without murmur rub or thrill. Abdomen is benign with no organomegaly or masses noted. Motor sensory and DTR levels are equal and symmetric in the upper and lower extremities. Cranial nerves II through XII are grossly intact. Proprioception is intact. No peripheral adenopathy or edema is identified. No motor or sensory levels are noted. Crude visual fields are within normal range.  RADIOLOGY RESULTS: No current films for  review  PLAN: Present time from a clinical standpoint he is doing well I've asked him to start activia yogurt at least 2 cups every week for a month to try to I'm otherwise please was overall progress. I will obtain a PSA in about 3-4 months and asked to see him back in follow-up at that time. Patient is to call sooner with any concerns.  I would like to take this opportunity to thank you for allowing me to participate in the care of your patient.Armstead Peaks., MD

## 2016-05-31 ENCOUNTER — Other Ambulatory Visit: Payer: BLUE CROSS/BLUE SHIELD

## 2016-06-07 ENCOUNTER — Ambulatory Visit: Payer: BLUE CROSS/BLUE SHIELD | Admitting: Radiation Oncology

## 2016-07-01 ENCOUNTER — Other Ambulatory Visit: Payer: Self-pay | Admitting: Family Medicine

## 2016-07-01 DIAGNOSIS — R7401 Elevation of levels of liver transaminase levels: Secondary | ICD-10-CM

## 2016-07-01 DIAGNOSIS — R740 Nonspecific elevation of levels of transaminase and lactic acid dehydrogenase [LDH]: Principal | ICD-10-CM

## 2016-07-01 DIAGNOSIS — E782 Mixed hyperlipidemia: Secondary | ICD-10-CM | POA: Insufficient documentation

## 2016-07-06 ENCOUNTER — Ambulatory Visit
Admission: RE | Admit: 2016-07-06 | Discharge: 2016-07-06 | Disposition: A | Discharge status: Home / Self Care | Payer: BLUE CROSS/BLUE SHIELD | Source: Ambulatory Visit | Attending: Family Medicine | Admitting: Family Medicine

## 2016-07-06 DIAGNOSIS — R7401 Elevation of levels of liver transaminase levels: Secondary | ICD-10-CM

## 2016-07-06 DIAGNOSIS — K7689 Other specified diseases of liver: Secondary | ICD-10-CM | POA: Insufficient documentation

## 2016-07-06 DIAGNOSIS — R740 Nonspecific elevation of levels of transaminase and lactic acid dehydrogenase [LDH]: Secondary | ICD-10-CM | POA: Insufficient documentation

## 2016-08-05 DIAGNOSIS — Z981 Arthrodesis status: Secondary | ICD-10-CM | POA: Insufficient documentation

## 2016-08-05 DIAGNOSIS — M4810 Ankylosing hyperostosis [Forestier], site unspecified: Secondary | ICD-10-CM | POA: Insufficient documentation

## 2016-11-18 ENCOUNTER — Other Ambulatory Visit: Payer: Self-pay | Admitting: *Deleted

## 2016-11-18 MED ORDER — CYCLOBENZAPRINE HCL 10 MG PO TABS: 10.0000 mg | ORAL_TABLET | Freq: Every day | ORAL | 2 refills | Status: DC

## 2016-11-18 NOTE — Telephone Encounter (Signed)
Last Visit: 03/12/16 Next Visit: 12/14/16  Okay to refill per Dr. Estanislado Pandy.

## 2016-11-19 ENCOUNTER — Other Ambulatory Visit: Payer: Self-pay | Admitting: *Deleted

## 2016-11-19 MED ORDER — DULOXETINE HCL 30 MG PO CPEP: 30.0000 mg | ORAL_CAPSULE | Freq: Two times a day (BID) | ORAL | 2 refills | Status: DC

## 2016-11-19 NOTE — Telephone Encounter (Signed)
Refill request received via fax for Cymbalta  Last Visit: 03/12/16 Next Visit: 12/14/16  Okay to refill per Dr. Estanislado Pandy

## 2016-11-24 ENCOUNTER — Other Ambulatory Visit: Payer: Self-pay | Admitting: Rheumatology

## 2016-11-24 MED ORDER — DULOXETINE HCL 30 MG PO CPEP: 30.0000 mg | ORAL_CAPSULE | Freq: Two times a day (BID) | ORAL | 0 refills | Status: DC

## 2016-11-24 NOTE — Telephone Encounter (Signed)
Patient has not received his Cymbalta from mail order pharmacy and is leaving town today. Patient is requesting a refill be sent to local pharmacy.   Last Visit: 03/12/16 Next Visit: 12/14/16  Okay to refill a week supply of Cymbalta to local pharmacy?

## 2016-11-24 NOTE — Telephone Encounter (Signed)
Patient's duloxetine hasn't arrived yet and he is leaving today to go out of town for a week. Can we send an rx to [facility] in [place] for 14 tablets.

## 2016-11-24 NOTE — Telephone Encounter (Signed)
Okay for 30 day supply per Dr. Estanislado Pandy

## 2016-12-07 DIAGNOSIS — M47819 Spondylosis without myelopathy or radiculopathy, site unspecified: Secondary | ICD-10-CM | POA: Insufficient documentation

## 2016-12-07 DIAGNOSIS — E790 Hyperuricemia without signs of inflammatory arthritis and tophaceous disease: Secondary | ICD-10-CM | POA: Insufficient documentation

## 2016-12-07 DIAGNOSIS — M19042 Primary osteoarthritis, left hand: Secondary | ICD-10-CM

## 2016-12-07 DIAGNOSIS — M5134 Other intervertebral disc degeneration, thoracic region: Secondary | ICD-10-CM | POA: Insufficient documentation

## 2016-12-07 DIAGNOSIS — R7989 Other specified abnormal findings of blood chemistry: Secondary | ICD-10-CM | POA: Insufficient documentation

## 2016-12-07 DIAGNOSIS — M722 Plantar fascial fibromatosis: Secondary | ICD-10-CM | POA: Insufficient documentation

## 2016-12-07 DIAGNOSIS — R945 Abnormal results of liver function studies: Secondary | ICD-10-CM

## 2016-12-07 DIAGNOSIS — M19071 Primary osteoarthritis, right ankle and foot: Secondary | ICD-10-CM | POA: Insufficient documentation

## 2016-12-07 DIAGNOSIS — M5136 Other intervertebral disc degeneration, lumbar region: Secondary | ICD-10-CM | POA: Insufficient documentation

## 2016-12-07 DIAGNOSIS — M19041 Primary osteoarthritis, right hand: Secondary | ICD-10-CM | POA: Insufficient documentation

## 2016-12-07 DIAGNOSIS — Z8669 Personal history of other diseases of the nervous system and sense organs: Secondary | ICD-10-CM | POA: Insufficient documentation

## 2016-12-07 DIAGNOSIS — G8929 Other chronic pain: Secondary | ICD-10-CM | POA: Insufficient documentation

## 2016-12-07 DIAGNOSIS — M19072 Primary osteoarthritis, left ankle and foot: Secondary | ICD-10-CM

## 2016-12-07 DIAGNOSIS — Z8546 Personal history of malignant neoplasm of prostate: Secondary | ICD-10-CM | POA: Insufficient documentation

## 2016-12-07 DIAGNOSIS — R768 Other specified abnormal immunological findings in serum: Secondary | ICD-10-CM | POA: Insufficient documentation

## 2016-12-07 DIAGNOSIS — M533 Sacrococcygeal disorders, not elsewhere classified: Secondary | ICD-10-CM

## 2016-12-07 DIAGNOSIS — Z8679 Personal history of other diseases of the circulatory system: Secondary | ICD-10-CM | POA: Insufficient documentation

## 2016-12-07 DIAGNOSIS — M5030 Other cervical disc degeneration, unspecified cervical region: Secondary | ICD-10-CM | POA: Insufficient documentation

## 2016-12-07 DIAGNOSIS — M170 Bilateral primary osteoarthritis of knee: Secondary | ICD-10-CM | POA: Insufficient documentation

## 2016-12-07 NOTE — Progress Notes (Signed)
Office Visit Note  Patient: Alexander Wilcox             Date of Birth: [birth year]           MRN: [id number]             PCP: Dion Body, MD Referring: Dion Body, MD Visit Date: 12/14/2016 Occupation: _0 @    Subjective:  Medication Management   History of Present Illness: Alexander Wilcox is a 66 y.o. 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 He states she's been off Enbrel for 1-1/2 year. He was diagnosed with prostate cancer and has been receiving treatment for that. He has not noticed a difference off being Enbrel. He has very limited range of motion of his C-spine and thoracic and lumbar spine. Rotating his head causes discomfort in his cervical spine and thoracic spine. He denies any joint swelling. He noticed the stiffness in his hands. He believes Cymbalta is very effective and does not notice any improvement with Flexeril. He will try to take it on when necessary basis and see if he can get off the Flexeril.  Activities of Daily Living:  Patient reports morning stiffness for all day hours.   Patient Reports nocturnal pain.  Difficulty dressing/grooming: Reports Difficulty climbing stairs: Reports Difficulty getting out of chair: Reports Difficulty using hands for taps, buttons, cutlery, and/or writing: Denies   Review of Systems  Constitutional: Positive for fatigue. Negative for night sweats and weakness ( ).  HENT: Positive for mouth dryness. Negative for mouth sores and nose dryness.   Eyes: Negative for redness and dryness.  Respiratory: Negative for shortness of breath and difficulty breathing.   Cardiovascular: Negative for chest pain, palpitations, hypertension, irregular heartbeat and swelling in legs/feet.  Gastrointestinal: Negative for constipation and diarrhea.  Endocrine: Negative for increased urination.  Musculoskeletal: Positive for arthralgias, joint pain and morning stiffness. Negative for joint swelling, myalgias, muscle  weakness, muscle tenderness and myalgias.  Skin: Negative for color change, rash, hair loss, nodules/bumps, skin tightness, ulcers and sensitivity to sunlight.  Allergic/Immunologic: Negative for susceptible to infections.  Neurological: Negative for dizziness, fainting, memory loss and night sweats.  Hematological: Negative for swollen glands.  Psychiatric/Behavioral: Positive for sleep disturbance. Negative for depressed mood. The patient is not nervous/anxious.        Due to nocturnal pain    PMFS History:  Patient Active Problem List   Diagnosis Date Noted  . 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 History of hypertension 12/07/2016  . History of prostate cancer 12/07/2016  . Plantar fasciitis 12/07/2016  . Elevated LFTs 12/07/2016  . DDD (degenerative disc disease), lumbar s/p fusion  12/07/2016  . 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 DDD (degenerative disc disease), cervical s/p fusion  12/07/2016  . Primary osteoarthritis of both hands 12/07/2016  . Primary osteoarthritis of both knees 12/07/2016  . Primary osteoarthritis of both feet 12/07/2016  . Rheumatoid factor positive 12/07/2016  . History of sleep apnea 12/07/2016  . Hyperuricemia 12/07/2016  . Chronic right SI joint pain 12/07/2016    Past Medical History:  Diagnosis Date  . Hypertension   . Prostate cancer ([place])   . Sleep apnea     No family history on file. Past Surgical History:  Procedure Laterality Date  . APPENDECTOMY    . BACK SURGERY    . PROSTATE SURGERY     Social History   Social History Narrative  . No narrative on file  Objective: Vital Signs: BP 140/82   Pulse 76   Resp 16   Ht _0  (1.727 m)   Wt 284 lb (128.8 kg)   BMI 43.18 kg/m    Physical Exam  Constitutional: He is oriented to person, place, and time. He appears well-developed and well-nourished.  HENT:  Head: Normocephalic and atraumatic.  Eyes: Conjunctivae and EOM are normal.  Pupils are equal, round, and reactive to light.  Neck: Normal range of motion. Neck supple.  Cardiovascular: Normal rate, regular rhythm and normal heart sounds.   Pulmonary/Chest: Effort normal and breath sounds normal.  Abdominal: Soft. Bowel sounds are normal.  Neurological: He is alert and oriented to person, place, and time.  Skin: Skin is warm and dry. Capillary refill takes less than 2 seconds.  Psychiatric: He has a normal mood and affect. His behavior is normal.  Nursing note and vitals reviewed.    Musculoskeletal Exam: He has almost no range of motion his C-spine and thoracic and lumbar spine very limited range of motion. No SI joint tenderness he has some discomfort with range of motion of his right shoulder elbow joints wrist joints are good range of motion with no synovitis is thickening of PIP/DIP joints in his hands. Hip joints knee joints ankles MTPs PIPs are good range of motion.  CDAI Exam: CDAI Homunculus Exam:   Joint Counts:  CDAI Tender Joint count: 0 CDAI Swollen Joint count: 0  Global Assessments:  Patient Global Assessment: 3 Provider Global Assessment: 5  CDAI Calculated Score: 8    Investigation: Findings:   In November of 2011, CBC, comprehensive metabolic panel, and sed rate were normal.  Uric acid was 9.1.  Rheumatoid factor was 66.  ANA was negative.  HLA-B27 and CCP were negative.    06/23/2010 negative Hepatitis Panel   06/09/2012 negative TB gold   04/17/2012 X-ray of bilateral hands done today shows bilateral DIP and PIP joint space narrowing with CMC joint space narrowing.  This is consistent with osteoarthritis.  There is also bilateral erosions noted to the proximal phalanx of bilateral second through fourth digits.  X-rays of bilateral feet with 2-views shows bilateral PIP and DIP joint space narrowing.  No changes to the MTPs.    07/06/2016 ultrasound abdomen showed diffuse increase in liver echogenicity. Otherwise unremarkable 06/25/2017  lipid panel triglycerides 203, CBC normal except platelets 145, CMP December. ALT 64 AST 89 glucose 172 hemoglobin A1c 6.6    Imaging: No results found.  Speciality Comments: No specialty comments available.    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 He has decreased range of motion of his cervical, thoracic and lumbar spine. He states he had no improvement on Enbrel and would like to stay off Enbrel.  Plantar fasciitis: Resolved now:  Chronic right SI joint pain: He continues to have some discomfort  Rheumatoid factor positive: He has no synovitis  DDD (degenerative disc disease), lumbar s/p fusion : Chronic pain  DDD (degenerative disc disease), thoracic: Chronic pain  DDD (degenerative disc disease), cervical s/p fusion : Chronic pain with no range of motion  Primary osteoarthritis of both hands: Joint protection and muscle strengthening discussed.: He has chronic discomfort  Primary osteoarthritis of both knees  Primary osteoarthritis of both feet  History of prostate cancer - Treated with radiation therapy  Elevated LFTs  History of  sleep apnea  Hyperuricemia  History of hypertension    Orders: No orders of the defined types were placed in this encounter.  No orders of the defined types were placed in this encounter.   Face-to-face time spent with patient was30 minutes. 50% of time was spent in counseling and coordination of care.  Follow-Up Instructions: Return in about 1 year (around 12/14/2017) for Ankylosing spondylitis.   Bo Merino, MD  Note - This record has been created using Editor, commissioning.  Chart creation errors have been sought, but may not always  have been located. Such creation errors do not reflect on  the standard of medical care.

## 2016-12-14 ENCOUNTER — Ambulatory Visit (INDEPENDENT_AMBULATORY_CARE_PROVIDER_SITE_OTHER): Payer: BLUE CROSS/BLUE SHIELD | Admitting: Rheumatology

## 2016-12-14 ENCOUNTER — Encounter: Payer: Self-pay | Admitting: Rheumatology

## 2016-12-14 VITALS — BP 140/82 | HR 76 | Resp 16 | Ht 68.0 in | Wt 284.0 lb

## 2016-12-14 DIAGNOSIS — M5134 Other intervertebral disc degeneration, thoracic region: Secondary | ICD-10-CM

## 2016-12-14 DIAGNOSIS — M19071 Primary osteoarthritis, right ankle and foot: Secondary | ICD-10-CM

## 2016-12-14 DIAGNOSIS — M5136 Other intervertebral disc degeneration, lumbar region: Secondary | ICD-10-CM

## 2016-12-14 DIAGNOSIS — M533 Sacrococcygeal disorders, not elsewhere classified: Secondary | ICD-10-CM

## 2016-12-14 DIAGNOSIS — R945 Abnormal results of liver function studies: Secondary | ICD-10-CM

## 2016-12-14 DIAGNOSIS — G8929 Other chronic pain: Secondary | ICD-10-CM

## 2016-12-14 DIAGNOSIS — M19041 Primary osteoarthritis, right hand: Secondary | ICD-10-CM

## 2016-12-14 DIAGNOSIS — Z8679 Personal history of other diseases of the circulatory system: Secondary | ICD-10-CM

## 2016-12-14 DIAGNOSIS — Z8669 Personal history of other diseases of the nervous system and sense organs: Secondary | ICD-10-CM

## 2016-12-14 DIAGNOSIS — M4690 Unspecified inflammatory spondylopathy, site unspecified: Secondary | ICD-10-CM | POA: Diagnosis not present

## 2016-12-14 DIAGNOSIS — M170 Bilateral primary osteoarthritis of knee: Secondary | ICD-10-CM

## 2016-12-14 DIAGNOSIS — Z8546 Personal history of malignant neoplasm of prostate: Secondary | ICD-10-CM

## 2016-12-14 DIAGNOSIS — E790 Hyperuricemia without signs of inflammatory arthritis and tophaceous disease: Secondary | ICD-10-CM

## 2016-12-14 DIAGNOSIS — R768 Other specified abnormal immunological findings in serum: Secondary | ICD-10-CM

## 2016-12-14 DIAGNOSIS — M19042 Primary osteoarthritis, left hand: Secondary | ICD-10-CM

## 2016-12-14 DIAGNOSIS — R7989 Other specified abnormal findings of blood chemistry: Secondary | ICD-10-CM

## 2016-12-14 DIAGNOSIS — M722 Plantar fascial fibromatosis: Secondary | ICD-10-CM

## 2016-12-14 DIAGNOSIS — M47819 Spondylosis without myelopathy or radiculopathy, site unspecified: Secondary | ICD-10-CM

## 2016-12-14 DIAGNOSIS — M19072 Primary osteoarthritis, left ankle and foot: Secondary | ICD-10-CM

## 2016-12-14 DIAGNOSIS — M5030 Other cervical disc degeneration, unspecified cervical region: Secondary | ICD-10-CM

## 2016-12-14 MED ORDER — DULOXETINE HCL 30 MG PO CPEP: 30.0000 mg | ORAL_CAPSULE | Freq: Two times a day (BID) | ORAL | 1 refills | Status: DC

## 2016-12-29 ENCOUNTER — Telehealth: Payer: Self-pay | Admitting: Pharmacist

## 2016-12-29 DIAGNOSIS — Z5181 Encounter for therapeutic drug level monitoring: Secondary | ICD-10-CM

## 2016-12-29 NOTE — Telephone Encounter (Signed)
Patient may reduce the Cymbalta to 30 mg by mouth daily. He should not be drinking alcohol. We can repeat his LFTs in one-month.

## 2016-12-29 NOTE — Telephone Encounter (Signed)
Received a fax regarding drug safety consideration: duloxetine and hepatic dysfunction.  "Duloxetine should be discontinued in patients who develop jaundice or other evidence of clinically significant liver dysfunction."     Reviewed patient's chart.  He was started on duloxetine in February 2013 for musculoskeletal pain.  Patient has had fluctuating liver enzymes since before starting duloxetine (04/05/11 AST 45, ALT 69; 02/17/11 AST 77, ALT 98).  Recent liver enzymes from [facility] are listed below:     Patient had ultrasound of the RUQ of his abdomen on 07/07/16 which showed no abnormal findings.    I do not believe the elevated liver enzymes are likely related to duloxetine, but would recommend close monitoring of liver enzymes while patient is on duloxetine since this medication is eliminated by the liver.  Patient has been doing well on duloxetine 30 mg BID.  Could consider dose reduction.  Please advise.    Elisabeth Most, Pharm.D., BCPS, CPP Clinical Pharmacist Pager: [phone] Phone: [phone] 12/29/2016 3:17 PM

## 2016-12-30 NOTE — Telephone Encounter (Signed)
I spoke to patient and reviewed recommendations to reduce C[place]ta dose to 30 mg daily and repeat LFTs in one month.  Patient voiced understanding.  I mailed him the lab order.    Elisabeth Most, Pharm.D., BCPS, CPP Clinical Pharmacist Pager: [phone] Phone: [phone] 12/30/2016 2:42 PM

## 2017-02-01 ENCOUNTER — Telehealth: Payer: Self-pay | Admitting: Pharmacist

## 2017-02-01 NOTE — Telephone Encounter (Signed)
Patient's duloxetine dose was reduced in June to 30 mg daily due to concern about elevated liver function test.  Patient was advised to repeat LFTs in one month.  I called patient to follow up on duloxetine and repeat labs.  Patient confirms he reduced duloxetine dose to 30 mg daily.  Patient reports that he broke his collar bone after falling off his deck.  He got a yeast infection from the sling and has been on antifungal medications.  He has been waiting until those are finished before repeating his labs.  He plans to repeat his labs in the next week or two.    Also received a fax from patient's insurance regarding duloxetine and concern about urinary retention.  Patient denies any concern about urinary retention.  Advised patient to report any symptoms of urinary hesitation/difficulty.  Patient voiced understanding and denies any questions or concerns.    Elisabeth Most, Pharm.D., BCPS, CPP Clinical Pharmacist Pager: [phone] Phone: [phone] 02/01/2017 9:45 AM

## 2017-02-18 LAB — CMP14+EGFR
ALBUMIN: 4.4 g/dL (ref 3.6–4.8)
ALK PHOS: 100 IU/L (ref 39–117)
ALT: 90 IU/L — ABNORMAL HIGH (ref 0–44)
AST: 57 IU/L — ABNORMAL HIGH (ref 0–40)
Albumin/Globulin Ratio: 1.5 (ref 1.2–2.2)
BUN / CREAT RATIO: 14 (ref 10–24)
BUN: 15 mg/dL (ref 8–27)
Bilirubin Total: 0.8 mg/dL (ref 0.0–1.2)
CALCIUM: 9.1 mg/dL (ref 8.6–10.2)
CO2: 19 mmol/L — AB (ref 20–29)
Chloride: 101 mmol/L (ref 96–106)
Creatinine, Ser: 1.11 mg/dL (ref 0.76–1.27)
GFR, EST AFRICAN AMERICAN: 81 mL/min/{1.73_m2} (ref >59)
GFR, EST NON AFRICAN AMERICAN: 70 mL/min/{1.73_m2} (ref >59)
GLOBULIN, TOTAL: 2.9 g/dL (ref 1.5–4.5)
Glucose: 313 mg/dL — ABNORMAL HIGH (ref 65–99)
Potassium: 4.5 mmol/L (ref 3.5–5.2)
SODIUM: 138 mmol/L (ref 134–144)
Total Protein: 7.3 g/dL (ref 6.0–8.5)

## 2017-02-18 NOTE — Telephone Encounter (Signed)
Blood glucose is elevated. LFTs are still elevated but stable. He should follow-up with his PCP regarding this.

## 2017-03-10 ENCOUNTER — Ambulatory Visit: Payer: Medicare Other | Admitting: *Deleted

## 2017-03-17 ENCOUNTER — Encounter: Payer: Self-pay | Admitting: *Deleted

## 2017-03-17 ENCOUNTER — Encounter: Payer: BLUE CROSS/BLUE SHIELD | Attending: Family Medicine | Admitting: *Deleted

## 2017-03-17 VITALS — BP 140/90 | Ht 68.0 in | Wt 273.3 lb

## 2017-03-17 DIAGNOSIS — I10 Essential (primary) hypertension: Secondary | ICD-10-CM | POA: Diagnosis not present

## 2017-03-17 DIAGNOSIS — Z713 Dietary counseling and surveillance: Secondary | ICD-10-CM | POA: Insufficient documentation

## 2017-03-17 DIAGNOSIS — E785 Hyperlipidemia, unspecified: Secondary | ICD-10-CM | POA: Diagnosis not present

## 2017-03-17 DIAGNOSIS — E119 Type 2 diabetes mellitus without complications: Secondary | ICD-10-CM | POA: Insufficient documentation

## 2017-03-17 NOTE — Progress Notes (Signed)
Diabetes Self-Management Education  Visit Type: First/Initial  Appt. Start Time: 1515 Appt. End Time: 1630  03/17/2017  [name], identified by name and date of birth, is a 66 y.o. male with a diagnosis of Diabetes: Type 2.   ASSESSMENT  Blood pressure 140/90, height 5\' 8"  (1.727 m), weight 273 lb 4.8 oz (124 kg). Body mass index is 41.56 kg/m.      Diabetes Self-Management Education - 03/17/17 1721      Visit Information   Visit Type First/Initial     Initial Visit   Diabetes Type Type 2   Are you currently following a meal plan? Yes   What type of meal plan do you follow? "less sugar"   Are you taking your medications as prescribed? Yes   Date Diagnosed 1 year ago     Health Coping   How would you rate your overall health? Fair     Psychosocial Assessment   Patient Belief/Attitude about Diabetes Motivated to manage diabetes  "ticked me off"   Self-care barriers None   Self-management support Family   Other persons present Spouse/SO   Patient Concerns Nutrition/Meal planning;Medication;Monitoring;Weight Control;Glycemic Control   Special Needs None   Preferred Learning Style Auditory;Hands on   Learning Readiness Ready   How often do you need to have someone help you when you read instructions, pamphlets, or other written materials from your doctor or pharmacy? 1 - Never   What is the last grade level you completed in school? 12th     Pre-Education Assessment   Patient understands the diabetes disease and treatment process. Needs Instruction   Patient understands incorporating nutritional management into lifestyle. Needs Instruction   Patient undertands incorporating physical activity into lifestyle. Needs Instruction   Patient understands using medications safely. Needs Instruction   Patient understands monitoring blood glucose, interpreting and using results Needs Review   Patient understands prevention, detection, and treatment of acute complications.  Needs Instruction   Patient understands prevention, detection, and treatment of chronic complications. Needs Instruction   Patient understands how to develop strategies to address psychosocial issues. Needs Instruction   Patient understands how to develop strategies to promote health/change behavior. Needs Instruction     Complications   Last HgB A1C per patient/outside source 8.5 %  03/08/17   How often do you check your blood sugar? 1-2 times/day   Fasting Blood glucose range (mg/dL) 130-179;180-200;>200  Pt reports FBG's 140-240 mg/dL.    Have you had a dilated eye exam in the past 12 months? Yes   Have you had a dental exam in the past 12 months? Yes   Are you checking your feet? No     Dietary Intake   Breakfast skips    Lunch cereal and milk; chicken pot pie; left overs; hamburger; hotdog, canned soup   Snack (afternoon) peanut butter crackers; peanuts   Dinner beef, pork, fish - shrimp, spaghetti, green beans, asparagus, pinto, celery, occasional corn, potatoes   Snack (evening) same as afternoon   Beverage(s) water, diet soda     Exercise   Exercise Type ADL's     Patient Education   Previous Diabetes Education No   Disease state  Definition of diabetes, type 1 and 2, and the diagnosis of diabetes;Factors that contribute to the development of diabetes   Nutrition management  Role of diet in the treatment of diabetes and the relationship between the three main macronutrients and blood glucose level;Food label reading, portion sizes and measuring food.;Reviewed blood  glucose goals for pre and post meals and how to evaluate the patients' food intake on their blood glucose level.   Physical activity and exercise  Role of exercise on diabetes management, blood pressure control and cardiac health.   Medications Reviewed patients medication for diabetes, action, purpose, timing of dose and side effects.   Monitoring Purpose and frequency of SMBG.;Taught/discussed recording of test  results and interpretation of SMBG.;Identified appropriate SMBG and/or A1C goals.   Chronic complications Relationship between chronic complications and blood glucose control   Psychosocial adjustment Role of stress on diabetes;Identified and addressed patients feelings and concerns about diabetes     Individualized Goals (developed by patient)   Reducing Risk Improve blood sugars Decrease medications Prevent diabetes complications Lose weight     Outcomes   Expected Outcomes Demonstrated interest in learning. Expect positive outcomes   Future DMSE 2 wks      Individualized Plan for Diabetes Self-Management Training:   Learning Objective:  Patient will have a greater understanding of diabetes self-management. Patient education plan is to attend individual and/or group sessions per assessed needs and concerns.   Plan:   Patient Instructions  Check blood sugars 1 x day before breakfast or 2 hrs after supper every day Bring blood sugar records to the next class Exercise as tolerated Eat 3 meals day,  1-2  snacks a day Space meals 4-6 hours apart Don't skip meals   Expected Outcomes:  Demonstrated interest in learning. Expect positive outcomes  Education material provided:  General Meal Planning Guidelines Simple Meal Plan  If problems or questions, patient to contact team via:  Johny Drilling, RN, [place], CDE [phone]  Future DSME appointment: 2 wks  March 28, 2017 for Diabetes Class 1

## 2017-03-17 NOTE — Patient Instructions (Signed)
Check blood sugars 1 x day before breakfast or 2 hrs after supper every day Bring blood sugar records to the next class  Exercise as tolerated  Eat 3 meals day,  1-2  snacks a day Space meals 4-6 hours apart Don't skip meals  Return for classes on:

## 2017-03-28 ENCOUNTER — Encounter: Payer: Self-pay | Admitting: Dietician

## 2017-03-28 ENCOUNTER — Ambulatory Visit: Payer: Medicare Other

## 2017-03-28 NOTE — Progress Notes (Signed)
Pt cancelled diabetes class today and rescheduled series on 04-25-17, 05-02-17 and 05-09-17

## 2017-04-04 ENCOUNTER — Ambulatory Visit: Payer: Medicare Other

## 2017-04-18 ENCOUNTER — Ambulatory Visit: Payer: Medicare Other

## 2017-04-25 ENCOUNTER — Ambulatory Visit: Payer: Medicare Other

## 2017-05-02 ENCOUNTER — Ambulatory Visit: Payer: Medicare Other

## 2017-05-06 ENCOUNTER — Other Ambulatory Visit: Payer: Self-pay

## 2017-05-06 NOTE — Telephone Encounter (Signed)
Patient would like a Rx refill for Duloxetine sent to [facility].  Stated that he is taking one a day.  Cb# is [phone].  Please advise.  Thank You.

## 2017-05-09 ENCOUNTER — Ambulatory Visit: Payer: Medicare Other

## 2017-05-09 NOTE — Telephone Encounter (Signed)
Last Visit: 12/14/16 Next Visit: 12/14/17  Okay to refill per Dr. Estanislado Pandy

## 2017-05-09 NOTE — Addendum Note (Signed)
Addended by: Carole Binning on: 05/09/2017 04:20 PM   Modules accepted: Orders

## 2017-05-10 MED ORDER — DULOXETINE HCL 30 MG PO CPEP
30.0000 mg | ORAL_CAPSULE | Freq: Two times a day (BID) | ORAL | 2 refills | Status: DC
Start: 2017-05-10 — End: 2018-06-20

## 2017-05-10 NOTE — Telephone Encounter (Signed)
Prescription sent to pharmacy.

## 2017-05-10 NOTE — Addendum Note (Signed)
Addended by: Carole Binning on: 05/10/2017 10:07 AM   Modules accepted: Orders

## 2017-05-13 ENCOUNTER — Encounter: Payer: Self-pay | Admitting: Dietician

## 2017-05-13 NOTE — Progress Notes (Signed)
Patient cancelled his class series which was scheduled to begin on 05/16/17. He is unable to reschedule at this time.

## 2017-05-16 ENCOUNTER — Ambulatory Visit: Payer: Medicare Other

## 2017-05-17 ENCOUNTER — Encounter: Payer: Self-pay | Admitting: *Deleted

## 2017-05-23 ENCOUNTER — Ambulatory Visit: Payer: Medicare Other

## 2017-05-30 ENCOUNTER — Ambulatory Visit: Payer: Medicare Other

## 2017-08-29 ENCOUNTER — Telehealth: Payer: Self-pay

## 2017-08-29 NOTE — Telephone Encounter (Signed)
Patient mailed [place] a permanent handicap placard renewal form he received in the mail. We completed the form and mailed it back to the patient in the envelope he provided. I called the patient and left a message on machine to notify patient that the form had been completed and is being mailed back to him today.

## 2017-11-21 ENCOUNTER — Other Ambulatory Visit: Payer: Self-pay | Admitting: Rheumatology

## 2017-11-21 NOTE — Telephone Encounter (Signed)
Last Visit: 12/14/16 Next Visit: 12/14/17  Okay to refill per Dr. Estanislado Pandy

## 2017-12-01 NOTE — Progress Notes (Signed)
Office Visit Note  Patient: Alexander Wilcox             Date of Birth: [birth year]           MRN: [id number]             PCP: Dion Body, MD Referring: Dion Body, MD Visit Date: 12/14/2017 Occupation: '@GUAROCC'$ @    Subjective:  Pain and stiffness in multiple joints.   History of Present Illness: Alexander Wilcox is a 67 y.o. male with history of a spondyloarthropathy, DDD and osteoarthritis.  He has been off Enbrel for 2 years after he was diagnosed with prostate cancer.  He states recently has been doing better and has not been taking hydrocodone.  He has been using Cymbalta for pain management which is been working.  He does have underlying disc disease of cervical and lumbar spine which causes discomfort.  He also has pain in his hands and feet.  He states he has peripheral neuropathy which causes discomfort in his feet.  Knee joints continue to hurt as well.  He denies any joint swelling.  Activities of Daily Living:  Patient reports morning stiffness for 3 minutes.   Patient Denies nocturnal pain.  Difficulty dressing/grooming: Denies Difficulty climbing stairs: Reports Difficulty getting out of chair: Reports Difficulty using hands for taps, buttons, cutlery, and/or writing: Denies   Review of Systems  Constitutional: Positive for activity change and fatigue. Negative for night sweats.  HENT: Positive for mouth dryness. Negative for mouth sores and nose dryness.   Eyes: Negative for redness and dryness.  Respiratory: Negative for shortness of breath and difficulty breathing.   Cardiovascular: Negative for chest pain, palpitations, hypertension, irregular heartbeat and swelling in legs/feet.  Gastrointestinal: Positive for constipation and diarrhea.  Endocrine: Negative for heat intolerance, excessive thirst and increased urination.  Genitourinary: Negative for difficulty urinating.  Musculoskeletal: Positive for arthralgias, joint pain and morning stiffness.  Negative for joint swelling, myalgias, muscle weakness, muscle tenderness and myalgias.  Skin: Negative for color change, rash, hair loss, nodules/bumps, skin tightness, ulcers and sensitivity to sunlight.  Allergic/Immunologic: Negative for susceptible to infections.  Neurological: Negative for dizziness, fainting, memory loss, night sweats and weakness.  Hematological: Negative for bruising/bleeding tendency and swollen glands.  Psychiatric/Behavioral: Positive for sleep disturbance. Negative for depressed mood. The patient is not nervous/anxious.     PMFS History:  Patient Active Problem List   Diagnosis Date Noted  . 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 History of hypertension 12/07/2016  . History of prostate cancer 12/07/2016  . Plantar fasciitis 12/07/2016  . Elevated LFTs 12/07/2016  . DDD (degenerative disc disease), lumbar s/p fusion  12/07/2016  . 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 DDD (degenerative disc disease), cervical s/p fusion  12/07/2016  . Primary osteoarthritis of both hands 12/07/2016  . Primary osteoarthritis of both knees 12/07/2016  . Primary osteoarthritis of both feet 12/07/2016  . Rheumatoid factor positive 12/07/2016  . History of sleep apnea 12/07/2016  . Hyperuricemia 12/07/2016  . Chronic right SI joint pain 12/07/2016    Past Medical History:  Diagnosis Date  . Arthritis   . Diabetes mellitus without complication (Alexander Wilcox)   . Hypertension   . Prostate cancer (Alexander Wilcox)   . Sleep apnea     Family History  Problem Relation Age of Onset  . Diabetes Sister   . Diabetes Maternal Grandmother    Past Surgical History:  Procedure Laterality Date  . APPENDECTOMY    .  BACK SURGERY    . KNEE ARTHROPLASTY    . PROSTATE SURGERY     Social History   Social History Narrative  . Not on file     Objective: Vital Signs: BP 129/64 (BP Location: Left Arm, Patient Position: Sitting, Cuff Size: Normal)   Pulse 72    Resp 18   Ht '5\' 8"'$  (1.727 m)   Wt 276 lb (125.2 kg)   BMI 41.97 kg/m    Physical Exam  Constitutional: He is oriented to person, place, and time. He appears well-developed and well-nourished.  HENT:  Head: Normocephalic and atraumatic.  Eyes: Pupils are equal, round, and reactive to light. Conjunctivae and EOM are normal.  Neck: Normal range of motion. Neck supple.  Cardiovascular: Normal rate, regular rhythm and normal heart sounds.  Pulmonary/Chest: Effort normal and breath sounds normal.  Abdominal: Soft. Bowel sounds are normal.  Neurological: He is alert and oriented to person, place, and time.  Skin: Skin is warm and dry. Capillary refill takes less than 2 seconds.  Psychiatric: He has a normal mood and affect. His behavior is normal.  Nursing note and vitals reviewed.    Musculoskeletal Exam: Patient has almost no range of motion in his cervical spine very limited range of motion thoracic lumbar spine.  He had some SI joint discomfort on palpation.  He has osteoarthritic changes in his hands and feet and also crepitus in his knee joint with limited range of motion.  No synovitis was noted.  CDAI Exam: No CDAI exam completed.    Investigation: No additional findings.   Imaging: No results found.  Speciality Comments: No specialty comments available.    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  He was treated with Enbrel in the past and had good response with the stiffness and discomfort.  It was discontinued after he was diagnosed with prostate cancer.  I discussion regarding restarting on Enbrel but he declined.  He states he had frequent infections and did not notice great improvement.  Plantar fasciitis-no recent episodes.  Chronic right SI joint pain-he continues to have some SI joint  discomfort.  Rheumatoid factor positive-patient had no synovitis on examination.  DDD (degenerative disc disease), cervical s/p fusion -he has no range of motion in the cervical spine.  I discussed possible use of avoid rearview mirror when he is driving.  DDD (degenerative disc disease), lumbar s/p fusion -he has very limited range of motion of his lumbar spine.  DDD (degenerative disc disease), thoracic-limited range of motion of thoracic spine.  Primary osteoarthritis of both hands-he has some stiffness but is tolerable.  He states he does not have to use hydrocodone.  Is been using Cymbalta for pain management.  Primary osteoarthritis of both knees-he has severe arthritis in his knee joints and difficulty with mobility.  Primary osteoarthritis of both feet-he has been using proper fitting shoes which are helpful.  History of sleep apnea  History of hypertension  History of prostate cancer - Treated with radiation therapy  Elevated LFTs-he had elevated LFTs.  He states he does drink alcohol.  We will check CBC and CMP today.  Hyperuricemia    Orders: Orders Placed This Encounter  Procedures  . COMPLETE METABOLIC PANEL WITH GFR  . CBC with Differential/Platelet   No orders of the defined types were placed in this encounter.  Follow-Up Instructions: Return in about 6 months (around 06/15/2018) for Spondyloarthropathy, Osteoarthritis.   Bo Merino, MD  Note - This record has been created using Editor, commissioning.  Chart creation errors have been sought, but may not always  have been located. Such creation errors do not reflect on  the standard of medical care.

## 2017-12-14 ENCOUNTER — Encounter: Payer: Self-pay | Admitting: Rheumatology

## 2017-12-14 ENCOUNTER — Ambulatory Visit (INDEPENDENT_AMBULATORY_CARE_PROVIDER_SITE_OTHER): Payer: BLUE CROSS/BLUE SHIELD | Admitting: Rheumatology

## 2017-12-14 VITALS — BP 129/64 | HR 72 | Resp 18 | Ht 68.0 in | Wt 276.0 lb

## 2017-12-14 DIAGNOSIS — M4690 Unspecified inflammatory spondylopathy, site unspecified: Secondary | ICD-10-CM

## 2017-12-14 DIAGNOSIS — M533 Sacrococcygeal disorders, not elsewhere classified: Secondary | ICD-10-CM

## 2017-12-14 DIAGNOSIS — M5134 Other intervertebral disc degeneration, thoracic region: Secondary | ICD-10-CM

## 2017-12-14 DIAGNOSIS — R768 Other specified abnormal immunological findings in serum: Secondary | ICD-10-CM | POA: Diagnosis not present

## 2017-12-14 DIAGNOSIS — M170 Bilateral primary osteoarthritis of knee: Secondary | ICD-10-CM

## 2017-12-14 DIAGNOSIS — M19041 Primary osteoarthritis, right hand: Secondary | ICD-10-CM

## 2017-12-14 DIAGNOSIS — M722 Plantar fascial fibromatosis: Secondary | ICD-10-CM | POA: Diagnosis not present

## 2017-12-14 DIAGNOSIS — G8929 Other chronic pain: Secondary | ICD-10-CM

## 2017-12-14 DIAGNOSIS — Z8669 Personal history of other diseases of the nervous system and sense organs: Secondary | ICD-10-CM | POA: Diagnosis not present

## 2017-12-14 DIAGNOSIS — M19071 Primary osteoarthritis, right ankle and foot: Secondary | ICD-10-CM

## 2017-12-14 DIAGNOSIS — M19042 Primary osteoarthritis, left hand: Secondary | ICD-10-CM

## 2017-12-14 DIAGNOSIS — M19072 Primary osteoarthritis, left ankle and foot: Secondary | ICD-10-CM

## 2017-12-14 DIAGNOSIS — Z8679 Personal history of other diseases of the circulatory system: Secondary | ICD-10-CM | POA: Diagnosis not present

## 2017-12-14 DIAGNOSIS — Z5181 Encounter for therapeutic drug level monitoring: Secondary | ICD-10-CM

## 2017-12-14 DIAGNOSIS — M5030 Other cervical disc degeneration, unspecified cervical region: Secondary | ICD-10-CM | POA: Diagnosis not present

## 2017-12-14 DIAGNOSIS — E790 Hyperuricemia without signs of inflammatory arthritis and tophaceous disease: Secondary | ICD-10-CM

## 2017-12-14 DIAGNOSIS — M47819 Spondylosis without myelopathy or radiculopathy, site unspecified: Secondary | ICD-10-CM

## 2017-12-14 DIAGNOSIS — R945 Abnormal results of liver function studies: Secondary | ICD-10-CM

## 2017-12-14 DIAGNOSIS — M5136 Other intervertebral disc degeneration, lumbar region: Secondary | ICD-10-CM

## 2017-12-14 DIAGNOSIS — R7989 Other specified abnormal findings of blood chemistry: Secondary | ICD-10-CM

## 2017-12-14 DIAGNOSIS — Z8546 Personal history of malignant neoplasm of prostate: Secondary | ICD-10-CM

## 2017-12-15 LAB — CBC WITH DIFFERENTIAL/PLATELET
BASOS ABS: 49 {cells}/uL (ref 0–200)
Basophils Relative: 0.9 %
EOS ABS: 211 {cells}/uL (ref 15–500)
Eosinophils Relative: 3.9 %
HCT: 43.7 % (ref 38.5–50.0)
Hemoglobin: 15.5 g/dL (ref 13.2–17.1)
Lymphs Abs: 1442 cells/uL (ref 850–3900)
MCH: 32.2 pg (ref 27.0–33.0)
MCHC: 35.5 g/dL (ref 32.0–36.0)
MCV: 90.7 fL (ref 80.0–100.0)
MONOS PCT: 7.7 %
MPV: 10.7 fL (ref 7.5–12.5)
Neutro Abs: 3283 cells/uL (ref 1500–7800)
Neutrophils Relative %: 60.8 %
PLATELETS: 148 10*3/uL (ref 140–400)
RBC: 4.82 10*6/uL (ref 4.20–5.80)
RDW: 13 % (ref 11.0–15.0)
TOTAL LYMPHOCYTE: 26.7 %
WBC mixed population: 416 cells/uL (ref 200–950)
WBC: 5.4 10*3/uL (ref 3.8–10.8)

## 2017-12-15 LAB — COMPLETE METABOLIC PANEL WITH GFR
AG RATIO: 1.6 (calc) (ref 1.0–2.5)
ALT: 75 U/L — ABNORMAL HIGH (ref 9–46)
AST: 38 U/L — AB (ref 10–35)
Albumin: 4.4 g/dL (ref 3.6–5.1)
Alkaline phosphatase (APISO): 70 U/L (ref 40–115)
BUN: 22 mg/dL (ref 7–25)
CALCIUM: 9.8 mg/dL (ref 8.6–10.3)
CO2: 28 mmol/L (ref 20–32)
Chloride: 103 mmol/L (ref 98–110)
Creat: 1.16 mg/dL (ref 0.70–1.25)
GFR, EST AFRICAN AMERICAN: 76 mL/min/{1.73_m2} (ref >=60)
GFR, EST NON AFRICAN AMERICAN: 66 mL/min/{1.73_m2} (ref >=60)
Globulin: 2.7 g/dL (calc) (ref 1.9–3.7)
Glucose, Bld: 146 mg/dL — ABNORMAL HIGH (ref 65–99)
POTASSIUM: 3.9 mmol/L (ref 3.5–5.3)
Sodium: 139 mmol/L (ref 135–146)
Total Bilirubin: 0.9 mg/dL (ref 0.2–1.2)
Total Protein: 7.1 g/dL (ref 6.1–8.1)

## 2017-12-15 NOTE — Progress Notes (Signed)
LFTs are better but is still elevated.  Probably related to fatty liver.  Please forward labs to his PCP.

## 2018-01-19 ENCOUNTER — Other Ambulatory Visit: Payer: Self-pay | Admitting: Rheumatology

## 2018-01-19 NOTE — Telephone Encounter (Signed)
Last Visit: 12/14/17 Next visit: 06/15/18  Okay to refill per Dr. Estanislado Pandy

## 2018-06-12 NOTE — Progress Notes (Deleted)
Office Visit Note  Patient: Alexander Wilcox             Date of Birth: [birth year]           MRN: [id number]             PCP: Dion Body, MD Referring: Dion Body, MD Visit Date: 06/21/2018 Occupation: '@GUAROCC'$ @  Subjective:  No chief complaint on file.   History of Present Illness: RADEN BYINGTON is a 67 y.o. male ***   Activities of Daily Living:  Patient reports morning stiffness for *** {minute/hour:19697}.   Patient {ACTIONS;DENIES/REPORTS:21021675::"Denies"} nocturnal pain.  Difficulty dressing/grooming: {ACTIONS;DENIES/REPORTS:21021675::"Denies"} Difficulty climbing stairs: {ACTIONS;DENIES/REPORTS:21021675::"Denies"} Difficulty getting out of chair: {ACTIONS;DENIES/REPORTS:21021675::"Denies"} Difficulty using hands for taps, buttons, cutlery, and/or writing: {ACTIONS;DENIES/REPORTS:21021675::"Denies"}  No Rheumatology ROS completed.   PMFS History:  Patient Active Problem List   Diagnosis Date Noted  . 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 History of hypertension 12/07/2016  . History of prostate cancer 12/07/2016  . Plantar fasciitis 12/07/2016  . Elevated LFTs 12/07/2016  . DDD (degenerative disc disease), lumbar s/p fusion  12/07/2016  . 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 DDD (degenerative disc disease), cervical s/p fusion  12/07/2016  . Primary osteoarthritis of both hands 12/07/2016  . Primary osteoarthritis of both knees 12/07/2016  . Primary osteoarthritis of both feet 12/07/2016  . Rheumatoid factor positive 12/07/2016  . History of sleep apnea 12/07/2016  . Hyperuricemia 12/07/2016  . Chronic right SI joint pain 12/07/2016    Past Medical History:  Diagnosis Date  . Arthritis   . Diabetes mellitus without complication ([place])   . Hypertension   . Prostate cancer ([place])   . Sleep apnea     Family History  Problem Relation Age of Onset  . Diabetes Sister   . Diabetes Maternal Grandmother     Past Surgical History:  Procedure Laterality Date  . APPENDECTOMY    . BACK SURGERY    . KNEE ARTHROPLASTY    . PROSTATE SURGERY     Social History   Social History Narrative  . Not on file    Objective: Vital Signs: There were no vitals taken for this visit.   Physical Exam   Musculoskeletal Exam: ***  CDAI Exam: CDAI Score: Not documented Patient Global Assessment: Not documented; Provider Global Assessment: Not documented Swollen: Not documented; Tender: Not documented Joint Exam   Not documented   There is currently no information documented on the homunculus. Go to the Rheumatology activity and complete the homunculus joint exam.  Investigation: No additional findings.  Imaging: No results found.  Recent Labs: Lab Results  Component Value Date   WBC 5.4 12/14/2017   HGB 15.5 12/14/2017   PLT 148 12/14/2017   NA 139 12/14/2017   K 3.9 12/14/2017   CL 103 12/14/2017   CO2 28 12/14/2017   GLUCOSE 146 (H) 12/14/2017   BUN 22 12/14/2017   CREATININE 1.16 12/14/2017   BILITOT 0.9 12/14/2017   ALKPHOS 100 02/17/2017   AST 38 (H) 12/14/2017   ALT 75 (H) 12/14/2017   PROT 7.1 12/14/2017   ALBUMIN 4.4 02/17/2017   CALCIUM 9.8 12/14/2017   GFRAA 76 12/14/2017    Speciality Comments: No specialty comments available.  Procedures:  No procedures performed Allergies: Dilaudid [hydromorphone hcl]   Assessment / Plan:     Visit Diagnoses: No diagnosis found.   Orders: No orders of the defined types were placed in this encounter.  No  orders of the defined types were placed in this encounter.   Face-to-face time spent with patient was *** minutes. Greater than 50% of time was spent in counseling and coordination of care.  Follow-Up Instructions: No follow-ups on file.   Earnestine Mealing, CMA  Note - This record has been created using Editor, commissioning.  Chart creation errors have been sought, but may not always  have been located. Such creation  errors do not reflect on  the standard of medical care.

## 2018-06-15 ENCOUNTER — Ambulatory Visit: Payer: BLUE CROSS/BLUE SHIELD | Admitting: Rheumatology

## 2018-06-20 ENCOUNTER — Ambulatory Visit (INDEPENDENT_AMBULATORY_CARE_PROVIDER_SITE_OTHER): Payer: BLUE CROSS/BLUE SHIELD | Admitting: Rheumatology

## 2018-06-20 ENCOUNTER — Encounter: Payer: Self-pay | Admitting: Rheumatology

## 2018-06-20 VITALS — BP 138/83 | HR 67 | Resp 14 | Ht 68.0 in | Wt 269.6 lb

## 2018-06-20 DIAGNOSIS — M47819 Spondylosis without myelopathy or radiculopathy, site unspecified: Secondary | ICD-10-CM | POA: Diagnosis not present

## 2018-06-20 DIAGNOSIS — Z8669 Personal history of other diseases of the nervous system and sense organs: Secondary | ICD-10-CM

## 2018-06-20 DIAGNOSIS — E790 Hyperuricemia without signs of inflammatory arthritis and tophaceous disease: Secondary | ICD-10-CM

## 2018-06-20 DIAGNOSIS — R768 Other specified abnormal immunological findings in serum: Secondary | ICD-10-CM | POA: Diagnosis not present

## 2018-06-20 DIAGNOSIS — M5136 Other intervertebral disc degeneration, lumbar region: Secondary | ICD-10-CM

## 2018-06-20 DIAGNOSIS — M533 Sacrococcygeal disorders, not elsewhere classified: Secondary | ICD-10-CM

## 2018-06-20 DIAGNOSIS — Z8546 Personal history of malignant neoplasm of prostate: Secondary | ICD-10-CM

## 2018-06-20 DIAGNOSIS — R945 Abnormal results of liver function studies: Secondary | ICD-10-CM

## 2018-06-20 DIAGNOSIS — Z8679 Personal history of other diseases of the circulatory system: Secondary | ICD-10-CM

## 2018-06-20 DIAGNOSIS — G8929 Other chronic pain: Secondary | ICD-10-CM

## 2018-06-20 DIAGNOSIS — M19041 Primary osteoarthritis, right hand: Secondary | ICD-10-CM

## 2018-06-20 DIAGNOSIS — M722 Plantar fascial fibromatosis: Secondary | ICD-10-CM | POA: Diagnosis not present

## 2018-06-20 DIAGNOSIS — M5030 Other cervical disc degeneration, unspecified cervical region: Secondary | ICD-10-CM

## 2018-06-20 DIAGNOSIS — R7989 Other specified abnormal findings of blood chemistry: Secondary | ICD-10-CM

## 2018-06-20 DIAGNOSIS — M170 Bilateral primary osteoarthritis of knee: Secondary | ICD-10-CM

## 2018-06-20 DIAGNOSIS — M5134 Other intervertebral disc degeneration, thoracic region: Secondary | ICD-10-CM

## 2018-06-20 DIAGNOSIS — M19042 Primary osteoarthritis, left hand: Secondary | ICD-10-CM

## 2018-06-20 DIAGNOSIS — M19072 Primary osteoarthritis, left ankle and foot: Secondary | ICD-10-CM

## 2018-06-20 DIAGNOSIS — M19071 Primary osteoarthritis, right ankle and foot: Secondary | ICD-10-CM

## 2018-06-20 MED ORDER — DULOXETINE HCL 30 MG PO CPEP: 30.0000 mg | ORAL_CAPSULE | Freq: Every day | ORAL | 0 refills | Status: DC

## 2018-06-20 NOTE — Progress Notes (Signed)
Office Visit Note  Patient: Alexander Wilcox             Date of Birth: 01-05-1951           MRN: [id number]             PCP: Dion Body, MD Referring: Dion Body, MD Visit Date: 06/20/2018 Occupation: '@GUAROCC'$ @  Subjective:  Chronic neck and lower back pain   History of Present Illness: Alexander Wilcox is a 67 y.o. male with history of spondyloarthropathy, DDD and osteoarthritis.  He has chronic pain in multiple joints including his neck, lower back, bilateral knee joints.  He states he has been having discomfort in his left hip.  He denies any plantar fasciitis at this time.  He reports that he had a viral upper respiratory tract infection last week and had a fever for 4 days.  He states that while he had a fever he developed increased pain in bilateral hands and feet.  He denies any joint swelling.  He reports that the pain in his hands and feet resolved after the fever resolved.  He reports that he is very interrupted sleep at night due to the pain in his neck, lower back, and bilateral knees.   Activities of Daily Living:  Patient reports morning stiffness for 2  minutes.   Patient Reports nocturnal pain.  Difficulty dressing/grooming: Denies Difficulty climbing stairs: Denies Difficulty getting out of chair: Denies Difficulty using hands for taps, buttons, cutlery, and/or writing: Denies  Review of Systems  Constitutional: Positive for fatigue. Negative for night sweats.  HENT: Positive for mouth dryness. Negative for mouth sores and nose dryness.   Eyes: Negative for redness, visual disturbance and dryness.  Respiratory: Positive for cough. Negative for hemoptysis, shortness of breath and difficulty breathing.   Cardiovascular: Negative for chest pain, palpitations, hypertension, irregular heartbeat and swelling in legs/feet.  Gastrointestinal: Positive for diarrhea (appt with GI tomorrow). Negative for blood in stool and constipation.  Endocrine: Negative for  increased urination.  Genitourinary: Negative for painful urination.  Musculoskeletal: Positive for arthralgias, joint pain and morning stiffness. Negative for joint swelling, myalgias, muscle weakness, muscle tenderness and myalgias.  Skin: Negative for color change, rash, hair loss, nodules/bumps, skin tightness, ulcers and sensitivity to sunlight.  Allergic/Immunologic: Negative for susceptible to infections.  Neurological: Negative for dizziness, fainting, memory loss, night sweats and weakness.  Hematological: Negative for swollen glands.  Psychiatric/Behavioral: Positive for sleep disturbance. Negative for depressed mood. The patient is not nervous/anxious.     PMFS History:  Patient Active Problem List   Diagnosis Date Noted  . 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 History of hypertension 12/07/2016  . History of prostate cancer 12/07/2016  . Plantar fasciitis 12/07/2016  . Elevated LFTs 12/07/2016  . DDD (degenerative disc disease), lumbar s/p fusion  12/07/2016  . 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 DDD (degenerative disc disease), cervical s/p fusion  12/07/2016  . Primary osteoarthritis of both hands 12/07/2016  . Primary osteoarthritis of both knees 12/07/2016  . Primary osteoarthritis of both feet 12/07/2016  . Rheumatoid factor positive 12/07/2016  . History of sleep apnea 12/07/2016  . Hyperuricemia 12/07/2016  . Chronic right SI joint pain 12/07/2016    Past Medical History:  Diagnosis Date  . Arthritis   . Diabetes mellitus without complication ([place])   . Hypertension   . Prostate cancer ([place])   . Sleep apnea     Family History  Problem Relation Age of Onset  . Diabetes Sister   . Diabetes Maternal Grandmother    Past Surgical History:  Procedure Laterality Date  . APPENDECTOMY    . BACK SURGERY    . KNEE ARTHROPLASTY    . PROSTATE SURGERY     Social History   Social History Narrative  . Not on file     Objective: Vital Signs: BP 138/83 (BP Location: Left Arm, Patient Position: Sitting, Cuff Size: Large)   Pulse 67   Resp 14   Ht '5\' 8"'$  (1.727 m)   Wt 269 lb 9.6 oz (122.3 kg)   BMI 40.99 kg/m    Physical Exam Vitals signs and nursing note reviewed.  Constitutional:      Appearance: He is well-developed.  HENT:     Head: Normocephalic and atraumatic.  Eyes:     Conjunctiva/sclera: Conjunctivae normal.     Pupils: Pupils are equal, round, and reactive to light.  Neck:     Musculoskeletal: Normal range of motion and neck supple.  Cardiovascular:     Rate and Rhythm: Normal rate and regular rhythm.     Heart sounds: Normal heart sounds.  Pulmonary:     Effort: Pulmonary effort is normal.     Breath sounds: Normal breath sounds.  Abdominal:     General: Bowel sounds are normal.     Palpations: Abdomen is soft.  Lymphadenopathy:     Cervical: No cervical adenopathy.  Skin:    General: Skin is warm and dry.     Capillary Refill: Capillary refill takes less than 2 seconds.  Neurological:     Mental Status: He is alert and oriented to person, place, and time.  Psychiatric:        Behavior: Behavior normal.      Musculoskeletal Exam: C-spine nearly no ROM due to previous C-spine fusion.  Limited ROM with discomfort of thoracic and lumbar spine.  No midline spinal tenderness or SI joint tenderness.  PIP and DIP synovial thickening.  He has complete fist formation bilaterally.  Hip joints good ROM.  Knee joints limited flexion and extension with discomfort.  No warmth or effusion noted of knee joints. He has bilateral knee crepitus.   CDAI Exam: CDAI Score: Not documented Patient Global Assessment: Not documented; Provider Global Assessment: Not documented Swollen: Not documented; Tender: Not documented Joint Exam   Not documented   There is currently no information documented on the homunculus. Go to the Rheumatology activity and complete the homunculus joint  exam.  Investigation: No additional findings.  Imaging: No results found.  Recent Labs: Lab Results  Component Value Date   WBC 5.4 12/14/2017   HGB 15.5 12/14/2017   PLT 148 12/14/2017   NA 139 12/14/2017   K 3.9 12/14/2017   CL 103 12/14/2017   CO2 28 12/14/2017   GLUCOSE 146 (H) 12/14/2017   BUN 22 12/14/2017   CREATININE 1.16 12/14/2017   BILITOT 0.9 12/14/2017   ALKPHOS 100 02/17/2017   AST 38 (H) 12/14/2017   ALT 75 (H) 12/14/2017   PROT 7.1 12/14/2017   ALBUMIN 4.4 02/17/2017   CALCIUM 9.8 12/14/2017   GFRAA 76 12/14/2017    Speciality Comments: No specialty comments available.  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  He was treated with Enbrel in the past but discontinued after he  was diagnosed with prostate cancer.  He does not want to restart on Enbrel at this time.  He has frequent infections.  He was advised to notify us if he develops new or worsening symptoms.  He will follow up in 5 months.   Rheumatoid factor positive: He has no active synovitis and dactylitis.   Chronic right SI joint pain: He has no SI joint tenderness on exam.  He has intermittent SI joint pain bilaterally.   Plantar fasciitis: He has not had any plantar fasciitis recently.   DDD (degenerative disc disease), cervical s/p fusion: He has nearly no C-spine ROM due to previous fusion.  He has chronic neck pain.  He has significant pain at night.   DDD (degenerative disc disease), lumbar s/p fusion: Limited ROM.  He has chronic lower back pain.    DDD (degenerative disc disease), thoracic: Limited ROM.  He has chronic thoracic pain.   Primary osteoarthritis of both hands: He has PIP and DIP synovial thickening.  He has no synovitis.  He has complete fist formation bilaterally.  Joint protection and muscle strengthening were  discussed.    Primary osteoarthritis of both knees: No warmth or effusion noted.  He has limited flexion and extension.  He has chronic pain and significant discomfort at night.   Primary osteoarthritis of both feet:  He has chronic pain in both feet due to neuropathy.  He wears proper fitting shoes.   Other medical conditions are listed as follows:   Hyperuricemia  History of hypertension  History of prostate cancer - Treated with radiation therapy in the past.   Elevated LFTs  History of sleep apnea   Orders: No orders of the defined types were placed in this encounter.  Meds ordered this encounter  Medications  . DULoxetine (CYMBALTA) 30 MG capsule    Sig: Take 1 capsule (30 mg total) by mouth daily.    Dispense:  90 capsule    Refill:  0     Follow-Up Instructions: Return in about 5 months (around 11/19/2018) for Spondyloarthropathy, Osteoarthritis, DDD.   Ofilia Neas, PA-C   I examined and evaluated the patient with Hazel Sams PA.  Patient had no synovitis on my examination.  He has very limited range of motion in his spine.  He was also having some discomfort in his left hip.  I offered x-rays but he declined.  He continues to have a lot of discomfort due to underlying osteoarthritis and spondyloarthropathy.  The plan of care was discussed as noted above.  Bo Merino, MD  Note - This record has been created using Editor, commissioning.  Chart creation errors have been sought, but may not always  have been located. Such creation errors do not reflect on  the standard of medical care.

## 2018-06-21 ENCOUNTER — Ambulatory Visit: Payer: BLUE CROSS/BLUE SHIELD | Admitting: Rheumatology

## 2018-11-16 NOTE — Progress Notes (Signed)
Office Visit Note  Patient: Alexander Wilcox             Date of Birth: 03/14/1951           MRN: [id number]             PCP: Dion Body, MD Referring: Dion Body, MD Visit Date: 11/30/2018 Occupation: '@GUAROCC'$ @  Subjective:  Neck pain   History of Present Illness: PENN GRISSETT is a 68 y.o. male with history of spondyloarthropathy, DDD, and osteoarthritis.  He tried increasing his dose of Cymbalta from 30 mg to 60 mg but did not notice any improvement.  He reports he has chronic neck and pain lower back pain.  He has been experiencing worsening neck pain and bilateral radiculopathy.  He was evaluated by PCP 2 weeks ago and was started on a 2-week course of Meloxicam.  His right arm radiculopathy has resolved.  He has some muscle tenderness in the left UE.  He denies any numbness or tingling.  He denies any weakness.  He has chronic knee joint pain and stiffness in both hands.  He denies joint swelling.   Activities of Daily Living:  Patient reports morning stiffness for 15 minutes.   Patient Reports nocturnal pain.  Difficulty dressing/grooming: Denies Difficulty climbing stairs: Reports Difficulty getting out of chair: Reports Difficulty using hands for taps, buttons, cutlery, and/or writing: Denies  Review of Systems  Constitutional: Negative for fatigue and night sweats.  HENT: Negative for mouth sores, trouble swallowing, trouble swallowing, mouth dryness and nose dryness.   Eyes: Negative for redness, visual disturbance and dryness.  Respiratory: Negative for cough, hemoptysis, shortness of breath and difficulty breathing.   Cardiovascular: Negative for chest pain, palpitations, hypertension, irregular heartbeat and swelling in legs/feet.  Gastrointestinal: Negative for blood in stool, constipation and diarrhea.  Endocrine: Negative for increased urination.  Genitourinary: Negative for painful urination.  Musculoskeletal: Positive for arthralgias, joint  pain, morning stiffness and muscle tenderness. Negative for joint swelling, myalgias, muscle weakness and myalgias.  Skin: Negative for color change, rash, hair loss, nodules/bumps, skin tightness, ulcers and sensitivity to sunlight.  Allergic/Immunologic: Negative for susceptible to infections.  Neurological: Negative for dizziness, fainting, memory loss, night sweats and weakness.  Hematological: Negative for swollen glands.  Psychiatric/Behavioral: Negative for depressed mood and sleep disturbance. The patient is not nervous/anxious.     PMFS History:  Patient Active Problem List   Diagnosis Date Noted  . 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 History of hypertension 12/07/2016  . History of prostate cancer 12/07/2016  . Plantar fasciitis 12/07/2016  . Elevated LFTs 12/07/2016  . DDD (degenerative disc disease), lumbar s/p fusion  12/07/2016  . 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 DDD (degenerative disc disease), cervical s/p fusion  12/07/2016  . Primary osteoarthritis of both hands 12/07/2016  . Primary osteoarthritis of both knees 12/07/2016  . Primary osteoarthritis of both feet 12/07/2016  . Rheumatoid factor positive 12/07/2016  . History of sleep apnea 12/07/2016  . Hyperuricemia 12/07/2016  . Chronic right SI joint pain 12/07/2016    Past Medical History:  Diagnosis Date  . Arthritis   . Diabetes mellitus without complication ([place])   . Hypertension   . Prostate cancer ([place])   . Sleep apnea     Family History  Problem Relation Age of Onset  . Diabetes Sister   . Diabetes Maternal Grandmother    Past Surgical History:  Procedure Laterality Date  .  APPENDECTOMY    . BACK SURGERY    . KNEE ARTHROPLASTY    . PROSTATE SURGERY     Social History   Social History Narrative  . Not on file    There is no immunization history on file for this patient.   Objective: Vital Signs: BP (!) 145/70 (BP Location: Left Arm, Patient  Position: Sitting, Cuff Size: Large)   Pulse 64   Resp 13   Ht '5\' 8"'$  (1.727 m)   Wt 273 lb (123.8 kg)   BMI 41.51 kg/m    Physical Exam Vitals signs and nursing note reviewed.  Constitutional:      Appearance: He is well-developed.  HENT:     Head: Normocephalic and atraumatic.  Eyes:     Conjunctiva/sclera: Conjunctivae normal.     Pupils: Pupils are equal, round, and reactive to light.  Neck:     Musculoskeletal: Normal range of motion and neck supple.  Cardiovascular:     Rate and Rhythm: Normal rate and regular rhythm.     Heart sounds: Normal heart sounds.  Pulmonary:     Effort: Pulmonary effort is normal.     Breath sounds: Normal breath sounds.  Abdominal:     General: Bowel sounds are normal.     Palpations: Abdomen is soft.  Lymphadenopathy:     Cervical: No cervical adenopathy.  Skin:    General: Skin is warm and dry.     Capillary Refill: Capillary refill takes less than 2 seconds.  Neurological:     Mental Status: He is alert and oriented to person, place, and time.  Psychiatric:        Behavior: Behavior normal.      Musculoskeletal Exam: C-spine fused.  Limited ROM of thoracic and lumbar spine.  No midline spinal tenderness.  No SI joint tenderness.  Shoulder joints, elbow joints, and wrist joints good ROM with no synovitis. PIP and DIP joint synovial thickening consistent with osteoarthritis of both hands. Limited ROM of both hip joints. Knee joints good ROM.  Bilateral knee joint crepitus.  No warmth or effusion of knee joints.  No tenderness or swelling of ankle joints. No achilles tendonitis or plantar fasciitis.    CDAI Exam: CDAI Score: Not documented Patient Global Assessment: Not documented; Provider Global Assessment: Not documented Swollen: Not documented; Tender: Not documented Joint Exam   Not documented   There is currently no information documented on the homunculus. Go to the Rheumatology activity and complete the homunculus joint exam.   Investigation: No additional findings.  Imaging: No results found.  Recent Labs: Lab Results  Component Value Date   WBC 5.4 12/14/2017   HGB 15.5 12/14/2017   PLT 148 12/14/2017   NA 139 12/14/2017   K 3.9 12/14/2017   CL 103 12/14/2017   CO2 28 12/14/2017   GLUCOSE 146 (H) 12/14/2017   BUN 22 12/14/2017   CREATININE 1.16 12/14/2017   BILITOT 0.9 12/14/2017   ALKPHOS 100 02/17/2017   AST 38 (H) 12/14/2017   ALT 75 (H) 12/14/2017   PROT 7.1 12/14/2017   ALBUMIN 4.4 02/17/2017   CALCIUM 9.8 12/14/2017   GFRAA 76 12/14/2017    Speciality Comments: No specialty comments available.  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  He was treated with Enbrel in the past but discontinued after he was diagnosed with prostate  cancer: He takes Hydrocodone PRN and Cymbalta 30 mg po daily for pain relief.  He has chronic neck and lower back pain.  He has been experiencing increased neck pain and bilateral radiculopathy.  He was evaluated by his PCP who prescribed a 2-week supply of meloxicam, which improved his right sided radiculopathy.  He has no midline spinal tenderness or SI joint tenderness at this time.  He has no active synovitis.  He was advised to notify us if he develops any new or worsening symptoms.  He will follow up in 5 months.   Rheumatoid factor positive  Chronic right SI joint pain: He has no SI joint tenderness on exam today.  Plantar fasciitis: Resolved.   DDD (degenerative disc disease), cervical s/p fusion-He has very minimal lateral rotation.  He has chronic neck pain.  He has been experiencing radiculopathy bilaterally.  He followed up with his PCP 2 weeks ago, and he was started on a 2-week trial of Meloxicam, which has improved his right sided radiculopathy.  He continues to have left sided radiculopathy.  He has  no muscle weakness at this time.   DDD (degenerative disc disease), lumbar s/p fusion: Chronic pain and stiffness.  He has no midline spinal tenderness or SI joint tenderness.  DDD (degenerative disc disease), thoracic: He has no midline spinal tenderness.   Primary osteoarthritis of both hands: He has PIP and DIP synovial thickening consistent with osteoarthritis.  Complete fist formation bilaterally. He has no tenderness or synovitis.  Joint protection and muscle strengthening were discussed.  He performs hand exercise, which help relieve his joint stiffness.   Primary osteoarthritis of both knees: Chronic pain.  He has bilateral knee joint crepitus.  No warmth or effusion noted.  He is not ready to proceed with a knee joint replacement at this time.   Primary osteoarthritis of both feet: He has no feet pain or joint swelling.   Other medical conditions are listed as follows:  Hyperuricemia  History of hypertension  History of prostate cancer  Elevated LFTs  History of sleep apnea   Orders: No orders of the defined types were placed in this encounter.  No orders of the defined types were placed in this encounter.     Follow-Up Instructions: Return in about 6 months (around 06/02/2019) for Spondyloarthropathy, Osteoarthritis.   Ofilia Neas, PA-C   I examined and evaluated the patient with Hazel Sams PA.  Patient has almost no range of motion in his cervical thoracic and lumbar spine.  He continues to have some significant stiffness.  No synovitis was noted.  He has been off Enbrel since the diagnosis of prostate cancer.  At this point he does not want to restart any Biologics.  He continues to have some discomfort on a regular basis.  The plan of care was discussed as noted above.  Bo Merino, MD  Note - This record has been created using Editor, commissioning.  Chart creation errors have been sought, but may not always  have been located. Such creation errors do not  reflect on  the standard of medical care.

## 2018-11-21 ENCOUNTER — Ambulatory Visit: Payer: BLUE CROSS/BLUE SHIELD | Admitting: Rheumatology

## 2018-11-29 ENCOUNTER — Other Ambulatory Visit: Payer: Self-pay | Admitting: Rheumatology

## 2018-11-29 NOTE — Telephone Encounter (Signed)
Last Visit: 06/21/19 Next Visit: 11/30/18  Okay to refill per Dr. Estanislado Pandy

## 2018-11-30 ENCOUNTER — Other Ambulatory Visit: Payer: Self-pay

## 2018-11-30 ENCOUNTER — Ambulatory Visit (INDEPENDENT_AMBULATORY_CARE_PROVIDER_SITE_OTHER): Payer: BLUE CROSS/BLUE SHIELD | Admitting: Rheumatology

## 2018-11-30 ENCOUNTER — Encounter: Payer: Self-pay | Admitting: Rheumatology

## 2018-11-30 VITALS — BP 145/70 | HR 64 | Resp 13 | Ht 68.0 in | Wt 273.0 lb

## 2018-11-30 DIAGNOSIS — R768 Other specified abnormal immunological findings in serum: Secondary | ICD-10-CM

## 2018-11-30 DIAGNOSIS — M19071 Primary osteoarthritis, right ankle and foot: Secondary | ICD-10-CM

## 2018-11-30 DIAGNOSIS — M19042 Primary osteoarthritis, left hand: Secondary | ICD-10-CM

## 2018-11-30 DIAGNOSIS — M51369 Other intervertebral disc degeneration, lumbar region without mention of lumbar back pain or lower extremity pain: Secondary | ICD-10-CM

## 2018-11-30 DIAGNOSIS — E790 Hyperuricemia without signs of inflammatory arthritis and tophaceous disease: Secondary | ICD-10-CM

## 2018-11-30 DIAGNOSIS — M19072 Primary osteoarthritis, left ankle and foot: Secondary | ICD-10-CM

## 2018-11-30 DIAGNOSIS — R7689 Other specified abnormal immunological findings in serum: Secondary | ICD-10-CM

## 2018-11-30 DIAGNOSIS — M170 Bilateral primary osteoarthritis of knee: Secondary | ICD-10-CM

## 2018-11-30 DIAGNOSIS — M722 Plantar fascial fibromatosis: Secondary | ICD-10-CM | POA: Diagnosis not present

## 2018-11-30 DIAGNOSIS — M5030 Other cervical disc degeneration, unspecified cervical region: Secondary | ICD-10-CM

## 2018-11-30 DIAGNOSIS — R7989 Other specified abnormal findings of blood chemistry: Secondary | ICD-10-CM

## 2018-11-30 DIAGNOSIS — Z8546 Personal history of malignant neoplasm of prostate: Secondary | ICD-10-CM

## 2018-11-30 DIAGNOSIS — M533 Sacrococcygeal disorders, not elsewhere classified: Secondary | ICD-10-CM | POA: Diagnosis not present

## 2018-11-30 DIAGNOSIS — M5134 Other intervertebral disc degeneration, thoracic region: Secondary | ICD-10-CM

## 2018-11-30 DIAGNOSIS — Z8679 Personal history of other diseases of the circulatory system: Secondary | ICD-10-CM

## 2018-11-30 DIAGNOSIS — G8929 Other chronic pain: Secondary | ICD-10-CM

## 2018-11-30 DIAGNOSIS — R945 Abnormal results of liver function studies: Secondary | ICD-10-CM

## 2018-11-30 DIAGNOSIS — M47819 Spondylosis without myelopathy or radiculopathy, site unspecified: Secondary | ICD-10-CM | POA: Diagnosis not present

## 2018-11-30 DIAGNOSIS — Z8669 Personal history of other diseases of the nervous system and sense organs: Secondary | ICD-10-CM

## 2018-11-30 DIAGNOSIS — M19041 Primary osteoarthritis, right hand: Secondary | ICD-10-CM

## 2018-11-30 DIAGNOSIS — M5136 Other intervertebral disc degeneration, lumbar region: Secondary | ICD-10-CM

## 2019-02-13 DIAGNOSIS — I517 Cardiomegaly: Secondary | ICD-10-CM | POA: Insufficient documentation

## 2019-05-10 NOTE — Progress Notes (Signed)
Office Visit Note  Patient: Alexander Wilcox             Date of Birth: 12-28-1950           MRN: [id number]             PCP: Dion Body, MD Referring: Dion Body, MD Visit Date: 05/24/2019 Occupation: '@GUAROCC'$ @  Subjective:  Joint stiffness   History of Present Illness: ABE SCHOOLS is a 68 y.o. male spondyloarthropathy and osteoarthritis.  He states he continues to have some joint stiffness but not so much just joint discomfort.  He states with Cymbalta the pain level has improved.  He has been off Enbrel for a while since he was diagnosed with prostate cancer.  He decided not to go back on Enbrel.  He has noticed that he has progression of limited mobility and has a spine.  He also gets some muscle spasms in the neck and lower back but they are manageable.  He denies any history of joint swelling.  Activities of Daily Living:  Patient reports morning stiffness for 24 hours.   Patient Denies nocturnal pain.  Difficulty dressing/grooming: Denies Difficulty climbing stairs: Reports Difficulty getting out of chair: Denies Difficulty using hands for taps, buttons, cutlery, and/or writing: Denies  Review of Systems  Constitutional: Positive for fatigue. Negative for night sweats.  HENT: Positive for mouth dryness. Negative for mouth sores and nose dryness.   Eyes: Negative for redness and dryness.  Respiratory: Negative for shortness of breath and difficulty breathing.   Cardiovascular: Negative for chest pain, palpitations, hypertension, irregular heartbeat and swelling in legs/feet.  Gastrointestinal: Positive for constipation and diarrhea.  Endocrine: Negative for heat intolerance, excessive thirst and increased urination.  Genitourinary: Negative for difficulty urinating.  Musculoskeletal: Positive for arthralgias, joint pain and morning stiffness. Negative for joint swelling, myalgias, muscle weakness, muscle tenderness and myalgias.  Skin: Negative for color  change, rash, hair loss, nodules/bumps, skin tightness, ulcers and sensitivity to sunlight.  Allergic/Immunologic: Negative for susceptible to infections.  Neurological: Positive for weakness. Negative for dizziness, fainting, memory loss and night sweats.  Hematological: Negative for bruising/bleeding tendency and swollen glands.  Psychiatric/Behavioral: Positive for sleep disturbance. Negative for depressed mood. The patient is not nervous/anxious.     PMFS History:  Patient Active Problem List   Diagnosis Date Noted  . 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 History of hypertension 12/07/2016  . History of prostate cancer 12/07/2016  . Plantar fasciitis 12/07/2016  . Elevated LFTs 12/07/2016  . DDD (degenerative disc disease), lumbar s/p fusion  12/07/2016  . 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 DDD (degenerative disc disease), cervical s/p fusion  12/07/2016  . Primary osteoarthritis of both hands 12/07/2016  . Primary osteoarthritis of both knees 12/07/2016  . Primary osteoarthritis of both feet 12/07/2016  . Rheumatoid factor positive 12/07/2016  . History of sleep apnea 12/07/2016  . Hyperuricemia 12/07/2016  . Chronic right SI joint pain 12/07/2016    Past Medical History:  Diagnosis Date  . Arthritis   . Diabetes mellitus without complication ([place])   . Hypertension   . Prostate cancer ([place])   . Sleep apnea     Family History  Problem Relation Age of Onset  . Diabetes Sister   . Diabetes Maternal Grandmother    Past Surgical History:  Procedure Laterality Date  . APPENDECTOMY    . BACK SURGERY    . KNEE ARTHROPLASTY    .  PROSTATE SURGERY     Social History   Social History Narrative  . Not on file    There is no immunization history on file for this patient.   Objective: Vital Signs: BP (!) 150/70 (BP Location: Left Arm, Patient Position: Sitting, Cuff Size: Normal)   Pulse 79   Resp 18   Ht '5\' 7"'$  (1.702 m)   Wt  257 lb 3.2 oz (116.7 kg)   BMI 40.28 kg/m    Physical Exam Vitals signs and nursing note reviewed.  Constitutional:      Appearance: He is well-developed.  HENT:     Head: Normocephalic and atraumatic.  Eyes:     Conjunctiva/sclera: Conjunctivae normal.     Pupils: Pupils are equal, round, and reactive to light.  Neck:     Musculoskeletal: Normal range of motion and neck supple.  Cardiovascular:     Rate and Rhythm: Normal rate and regular rhythm.     Heart sounds: Normal heart sounds.  Pulmonary:     Effort: Pulmonary effort is normal.     Breath sounds: Normal breath sounds.  Abdominal:     General: Bowel sounds are normal.     Palpations: Abdomen is soft.  Skin:    General: Skin is warm and dry.     Capillary Refill: Capillary refill takes less than 2 seconds.  Neurological:     Mental Status: He is alert and oriented to person, place, and time.  Psychiatric:        Behavior: Behavior normal.      Musculoskeletal Exam: He has no range of motion of his cervical thoracic or lumbar spine.  He had no SI joint tenderness.  Shoulder joints, elbow joints, wrist joints with good range of motion.  He had bilateral CMC PIP and DIP thickening with no synovitis.  Good range of motion of his hip joints and knee joints.  It crepitus in the joints.  He has osteoarthritic changes in his MCPs PIPs and DIPs.  CDAI Exam: CDAI Score: - Patient Global: -; Provider Global: - Swollen: -; Tender: - Joint Exam   No joint exam has been documented for this visit   There is currently no information documented on the homunculus. Go to the Rheumatology activity and complete the homunculus joint exam.  Investigation: No additional findings.  Imaging: No results found.  Recent Labs: Lab Results  Component Value Date   WBC 5.4 12/14/2017   HGB 15.5 12/14/2017   PLT 148 12/14/2017   NA 139 12/14/2017   K 3.9 12/14/2017   CL 103 12/14/2017   CO2 28 12/14/2017   GLUCOSE 146 (H)  12/14/2017   BUN 22 12/14/2017   CREATININE 1.16 12/14/2017   BILITOT 0.9 12/14/2017   ALKPHOS 100 02/17/2017   AST 38 (H) 12/14/2017   ALT 75 (H) 12/14/2017   PROT 7.1 12/14/2017   ALBUMIN 4.4 02/17/2017   CALCIUM 9.8 12/14/2017   GFRAA 76 12/14/2017    Speciality Comments: No specialty comments available.  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  He was treated with Enbrel in the past but discontinued after he was diagnosed with prostate cancer.  Patient does not want to go back on Enbrel as he is concerned about the possible association with cancer.  Although I discussed that there is no increased risk of prostate cancer with Enbrel.  His pain is manageable currently.:Hydrocodone PRN and Cymbalta 30 mg  Rheumatoid factor positive-he has no synovitis on examination today.  Chronic right SI joint pain-recently his pain has been manageable.  Plantar fasciitis-he denies any recent episode of plantar fasciitis.  DDD (degenerative disc disease), cervical s/p fusion   DDD (degenerative disc disease), lumbar s/p fusion   DDD (degenerative disc disease), thoracic-he has no limitation of motion.  Primary osteoarthritis of both hands-joint protection was discussed.  Primary osteoarthritis of both knees-lower extremity exercises were discussed.  Primary osteoarthritis of both feet-proper fitting shoes were discussed.  Hyperuricemia-he has not had any gout flares.  History of hypertension-his systolic pressure was elevated today.  He has been advised to monitor his blood pressure.  History of prostate cancer-patient states the PSA levels have been elevated recently.  Elevated LFTs  History of sleep apnea  Orders: No orders of the defined types were placed in this encounter.  No orders of the defined types were placed in this  encounter.   Follow-Up Instructions: Return in about 6 months (around 11/21/2019) for Spondyloarthropathy, Osteoarthritis.   Bo Merino, MD  Note - This record has been created using Editor, commissioning.  Chart creation errors have been sought, but may not always  have been located. Such creation errors do not reflect on  the standard of medical care.

## 2019-05-24 ENCOUNTER — Other Ambulatory Visit: Payer: Self-pay

## 2019-05-24 ENCOUNTER — Encounter: Payer: Self-pay | Admitting: Rheumatology

## 2019-05-24 ENCOUNTER — Ambulatory Visit (INDEPENDENT_AMBULATORY_CARE_PROVIDER_SITE_OTHER): Payer: BC Managed Care – PPO | Admitting: Rheumatology

## 2019-05-24 VITALS — BP 150/70 | HR 79 | Resp 18 | Ht 67.0 in | Wt 257.2 lb

## 2019-05-24 DIAGNOSIS — M533 Sacrococcygeal disorders, not elsewhere classified: Secondary | ICD-10-CM | POA: Diagnosis not present

## 2019-05-24 DIAGNOSIS — M19071 Primary osteoarthritis, right ankle and foot: Secondary | ICD-10-CM

## 2019-05-24 DIAGNOSIS — M5136 Other intervertebral disc degeneration, lumbar region: Secondary | ICD-10-CM

## 2019-05-24 DIAGNOSIS — M19042 Primary osteoarthritis, left hand: Secondary | ICD-10-CM

## 2019-05-24 DIAGNOSIS — M5134 Other intervertebral disc degeneration, thoracic region: Secondary | ICD-10-CM

## 2019-05-24 DIAGNOSIS — G8929 Other chronic pain: Secondary | ICD-10-CM

## 2019-05-24 DIAGNOSIS — E790 Hyperuricemia without signs of inflammatory arthritis and tophaceous disease: Secondary | ICD-10-CM

## 2019-05-24 DIAGNOSIS — M19072 Primary osteoarthritis, left ankle and foot: Secondary | ICD-10-CM

## 2019-05-24 DIAGNOSIS — Z8546 Personal history of malignant neoplasm of prostate: Secondary | ICD-10-CM

## 2019-05-24 DIAGNOSIS — M19041 Primary osteoarthritis, right hand: Secondary | ICD-10-CM

## 2019-05-24 DIAGNOSIS — R7989 Other specified abnormal findings of blood chemistry: Secondary | ICD-10-CM

## 2019-05-24 DIAGNOSIS — M170 Bilateral primary osteoarthritis of knee: Secondary | ICD-10-CM

## 2019-05-24 DIAGNOSIS — M722 Plantar fascial fibromatosis: Secondary | ICD-10-CM | POA: Diagnosis not present

## 2019-05-24 DIAGNOSIS — M5030 Other cervical disc degeneration, unspecified cervical region: Secondary | ICD-10-CM

## 2019-05-24 DIAGNOSIS — Z8679 Personal history of other diseases of the circulatory system: Secondary | ICD-10-CM

## 2019-05-24 DIAGNOSIS — M47819 Spondylosis without myelopathy or radiculopathy, site unspecified: Secondary | ICD-10-CM | POA: Diagnosis not present

## 2019-05-24 DIAGNOSIS — R768 Other specified abnormal immunological findings in serum: Secondary | ICD-10-CM

## 2019-05-24 DIAGNOSIS — Z8669 Personal history of other diseases of the nervous system and sense organs: Secondary | ICD-10-CM

## 2019-09-18 DIAGNOSIS — Z0000 Encounter for general adult medical examination without abnormal findings: Secondary | ICD-10-CM | POA: Insufficient documentation

## 2019-11-08 NOTE — Progress Notes (Signed)
Office Visit Note  Patient: Alexander Wilcox             Date of Birth: [birth year]           MRN: [id number]             PCP: Dion Body, MD Referring: Dion Body, MD Visit Date: 11/19/2019 Occupation: '@GUAROCC'$ @  Subjective:  Pain in both knee joints   History of Present Illness: Alexander Wilcox is a 69 y.o. male with history of spondyloarthropathy.  He has not taking any immunosuppressive agents at this time.  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  He takes hydrocodone very sparingly for pain relief and continues to take Cymbalta 30 mg 1 capsule daily.  Patient reports that his prostate cancer is back for the fourth time.  He has been followed closely by his urologist and is scheduled for PET scan to assess for metastasis.  He states he has been experiencing hot flashes and neuropathy in his hands and feet which are a side effect of the medications being used for the treatment of prostate cancer. Patient reports that he continues to have chronic pain in both knee joints.  He states that has been trying to put off knee replacement for the last 10 years.  He denies any swelling in his knees at this time.  He states he has been started experiencing increased discomfort in both hips which she attributes to gait changes with his knee joint pain.  He is also been experiencing left shoulder joint pain has had 2 cortisone injections by an orthopedist as well as a left elbow injection which resolved his discomfort.  He denies any other joint pain or joint swelling.    Activities of Daily Living:  Patient reports morning stiffness for several   hours.   Patient Reports nocturnal pain.  Difficulty dressing/grooming: Denies Difficulty climbing stairs: Denies Difficulty getting out of chair: Denies Difficulty using hands for taps, buttons, cutlery, and/or writing: Reports  Review of Systems  Constitutional: Negative for fatigue  and night sweats.  HENT: Negative for mouth sores, mouth dryness and nose dryness.   Eyes: Negative for redness, itching and dryness.  Respiratory: Negative for cough, hemoptysis, shortness of breath and difficulty breathing.   Cardiovascular: Negative for chest pain, palpitations, hypertension, irregular heartbeat and swelling in legs/feet.  Gastrointestinal: Negative for blood in stool, constipation and diarrhea.  Endocrine: Negative for increased urination.  Genitourinary: Negative for difficulty urinating and painful urination.  Musculoskeletal: Positive for arthralgias, joint pain and morning stiffness. Negative for joint swelling, myalgias, muscle weakness, muscle tenderness and myalgias.  Skin: Negative for color change, rash, hair loss, nodules/bumps, redness, skin tightness, ulcers and sensitivity to sunlight.  Allergic/Immunologic: Negative for susceptible to infections.  Neurological: Negative for dizziness, fainting, headaches, memory loss, night sweats and weakness.  Hematological: Negative for bruising/bleeding tendency and swollen glands.  Psychiatric/Behavioral: Negative for depressed mood, confusion and sleep disturbance. The patient is not nervous/anxious.     PMFS History:  Patient Active Problem List   Diagnosis Date Noted  . 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 History of hypertension 12/07/2016  . History of prostate cancer 12/07/2016  . Plantar fasciitis 12/07/2016  . Elevated LFTs 12/07/2016  . DDD (degenerative disc disease), lumbar s/p fusion  12/07/2016  . 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 DDD (degenerative disc disease), cervical s/p fusion  12/07/2016  . Primary  osteoarthritis of both hands 12/07/2016  . Primary osteoarthritis of both knees 12/07/2016  . Primary osteoarthritis of both feet 12/07/2016  . Rheumatoid factor positive 12/07/2016  . History of sleep apnea 12/07/2016  . Hyperuricemia 12/07/2016  .  Chronic right SI joint pain 12/07/2016    Past Medical History:  Diagnosis Date  . Arthritis   . Diabetes mellitus without complication ([place])   . Hypertension   . Prostate cancer ([place])   . Sleep apnea     Family History  Problem Relation Age of Onset  . Diabetes Sister   . Diabetes Maternal Grandmother   . Lung disease Brother   . Kidney failure Sister    Past Surgical History:  Procedure Laterality Date  . APPENDECTOMY    . BACK SURGERY    . KNEE ARTHROPLASTY    . PROSTATE SURGERY     Social History   Social History Narrative  . Not on file    There is no immunization history on file for this patient.   Objective: Vital Signs: BP 136/83 (BP Location: Left Arm, Patient Position: Sitting, Cuff Size: Normal)   Pulse 81   Resp 17   Ht '5\' 8"'$  (1.727 m)   Wt 272 lb (123.4 kg)   BMI 41.36 kg/m    Physical Exam Vitals and nursing note reviewed.  Constitutional:      Appearance: He is well-developed.  HENT:     Head: Normocephalic and atraumatic.  Eyes:     Conjunctiva/sclera: Conjunctivae normal.     Pupils: Pupils are equal, round, and reactive to light.  Pulmonary:     Effort: Pulmonary effort is normal.  Abdominal:     General: Bowel sounds are normal.     Palpations: Abdomen is soft.  Musculoskeletal:     Cervical back: Normal range of motion and neck supple.  Skin:    General: Skin is warm and dry.     Capillary Refill: Capillary refill takes less than 2 seconds.  Neurological:     Mental Status: He is alert and oriented to person, place, and time.  Psychiatric:        Behavior: Behavior normal.      Musculoskeletal Exam: C-spine, thoracic spine, and lumbar spine fused.  No midline spinal tenderness.  No SI joint tenderness.  Shoulder joints, elbow joints, wrist joints, MCPs, PIPs ,and DIPs good ROM with no synovitis.  CMC joint thickening bilaterally.  PIP and DIP thickening consistent with osteoarthritis of both hands.  Painful and limited ROM of both  hips.  Painful ROM with crepitus in both knees.  No tenderness or inflammation in ankle joints.   CDAI Exam: CDAI Score: -- Patient Global: --; Provider Global: -- Swollen: --; Tender: -- Joint Exam 11/19/2019   No joint exam has been documented for this visit   There is currently no information documented on the homunculus. Go to the Rheumatology activity and complete the homunculus joint exam.  Investigation: No additional findings.  Imaging: No results found.  Recent Labs: Lab Results  Component Value Date   WBC 5.4 12/14/2017   HGB 15.5 12/14/2017   PLT 148 12/14/2017   NA 139 12/14/2017   K 3.9 12/14/2017   CL 103 12/14/2017   CO2 28 12/14/2017   GLUCOSE 146 (H) 12/14/2017   BUN 22 12/14/2017   CREATININE 1.16 12/14/2017   BILITOT 0.9 12/14/2017   ALKPHOS 100 02/17/2017   AST 38 (H) 12/14/2017   ALT 75 (H) 12/14/2017  PROT 7.1 12/14/2017   ALBUMIN 4.4 02/17/2017   CALCIUM 9.8 12/14/2017   GFRAA 76 12/14/2017    Speciality Comments: No specialty comments available.  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  He has no midline spinal tenderness at this time.  No SI joint tenderness.  He is not taking any immunosuppressive agents at this time.  He was previously on Enbrel but discontinued after being diagnosed with prostate cancer several years ago.  According to the patient his prostate cancer is back for the fourth time.  He does not want to take any biologic agents.  He takes hydrocodone very sparingly for pain relief and Cymbalta 30 mg 1 capsule daily.  Rheumatoid factor positive: He has no synovitis on exam.    Chronic right SI joint pain: He has no SI joint tenderness on exam.   Plantar fasciitis: Resolved.   DDD (degenerative disc disease), cervical  s/p fusion: Fused.    DDD (degenerative disc disease), lumbar s/p fusion: No midline spinal tenderness.    DDD (degenerative disc disease), thoracic: No midline spinal tenderness.    Primary osteoarthritis of both hands: He has PIP and DIP thickening consistent with osteoarthritis of both hands.  CMC joint thickening noted bilaterally.  No tenderness or synovitis was noted.  He has complete fist formation bilaterally.  He performs hand exercises on a daily basis.  Joint protection and muscle strengthening were discussed.  Primary osteoarthritis of both knees: He has chronic pain in both knee joints.  He has painful range of motion with crepitus bilaterally.  No warmth or effusion was noted.  He has had Visco gel injections in the past which provided no relief.  He is considering proceeding with a knee replacement due to the persistent discomfort and new onset pain in both hip joints due to his gait change.  We discussed the importance of regular exercise and weight loss.  He was encouraged to use a stationary bike.   Primary osteoarthritis of both feet: He has not having any joint discomfort in his feet at this time.  He has been experiencing intermittent symptoms of neuropathy in both feet.  Hyperuricemia: No record of recent uric acid level.   Elevated LFTs: LFTs were within normal limits on 09/17/2019.  Other medical conditions are listed as follows:  History of sleep apnea  History of prostate cancer  History of hypertension  Orders: No orders of the defined types were placed in this encounter.  No orders of the defined types were placed in this encounter.    Follow-Up Instructions: Return in about 6 months (around 05/21/2020) for Spondyloarthropathy.   Hazel Sams, PA-C I examined and evaluated the patient with Hazel Sams PA.  He continues to have ongoing pain and discomfort due to underlying ankylosing spondylitis and degenerative disc disease.  He states he has been having  lot of discomfort in his left SI joint and his bilateral knee joints.  He is considering total knee replacement in the future.  He was interested in exploring other treatment options.  He has had Visco supplement injections in the past without adequate response.  We had detailed discussion regarding weight loss diet and exercise.  He had no synovitis on examination.  We had detailed discussion regarding total knee replacement.  The plan of care was discussed as  noted above.  Bo Merino, MD Note - This record has been created using Editor, commissioning.  Chart creation errors have been sought, but may not always  have been located. Such creation errors do not reflect on  the standard of medical care.

## 2019-11-19 ENCOUNTER — Encounter: Payer: Self-pay | Admitting: Rheumatology

## 2019-11-19 ENCOUNTER — Other Ambulatory Visit: Payer: Self-pay

## 2019-11-19 ENCOUNTER — Ambulatory Visit: Payer: Medicare Other | Admitting: Rheumatology

## 2019-11-19 VITALS — BP 136/83 | HR 81 | Resp 17 | Ht 68.0 in | Wt 272.0 lb

## 2019-11-19 DIAGNOSIS — Z8546 Personal history of malignant neoplasm of prostate: Secondary | ICD-10-CM

## 2019-11-19 DIAGNOSIS — M51369 Other intervertebral disc degeneration, lumbar region without mention of lumbar back pain or lower extremity pain: Secondary | ICD-10-CM

## 2019-11-19 DIAGNOSIS — M19041 Primary osteoarthritis, right hand: Secondary | ICD-10-CM

## 2019-11-19 DIAGNOSIS — M722 Plantar fascial fibromatosis: Secondary | ICD-10-CM | POA: Diagnosis not present

## 2019-11-19 DIAGNOSIS — M5030 Other cervical disc degeneration, unspecified cervical region: Secondary | ICD-10-CM

## 2019-11-19 DIAGNOSIS — M19042 Primary osteoarthritis, left hand: Secondary | ICD-10-CM

## 2019-11-19 DIAGNOSIS — Z8679 Personal history of other diseases of the circulatory system: Secondary | ICD-10-CM

## 2019-11-19 DIAGNOSIS — G8929 Other chronic pain: Secondary | ICD-10-CM

## 2019-11-19 DIAGNOSIS — M533 Sacrococcygeal disorders, not elsewhere classified: Secondary | ICD-10-CM | POA: Diagnosis not present

## 2019-11-19 DIAGNOSIS — E790 Hyperuricemia without signs of inflammatory arthritis and tophaceous disease: Secondary | ICD-10-CM

## 2019-11-19 DIAGNOSIS — M47819 Spondylosis without myelopathy or radiculopathy, site unspecified: Secondary | ICD-10-CM

## 2019-11-19 DIAGNOSIS — M170 Bilateral primary osteoarthritis of knee: Secondary | ICD-10-CM

## 2019-11-19 DIAGNOSIS — M5136 Other intervertebral disc degeneration, lumbar region: Secondary | ICD-10-CM

## 2019-11-19 DIAGNOSIS — Z8669 Personal history of other diseases of the nervous system and sense organs: Secondary | ICD-10-CM

## 2019-11-19 DIAGNOSIS — R7989 Other specified abnormal findings of blood chemistry: Secondary | ICD-10-CM

## 2019-11-19 DIAGNOSIS — R768 Other specified abnormal immunological findings in serum: Secondary | ICD-10-CM

## 2019-11-19 DIAGNOSIS — M5134 Other intervertebral disc degeneration, thoracic region: Secondary | ICD-10-CM

## 2019-11-19 DIAGNOSIS — M19072 Primary osteoarthritis, left ankle and foot: Secondary | ICD-10-CM

## 2019-11-19 DIAGNOSIS — M19071 Primary osteoarthritis, right ankle and foot: Secondary | ICD-10-CM

## 2019-11-22 ENCOUNTER — Inpatient Hospital Stay: Payer: Medicare Other

## 2019-11-22 ENCOUNTER — Inpatient Hospital Stay: Payer: Medicare Other | Attending: Oncology | Admitting: Oncology

## 2019-11-22 ENCOUNTER — Other Ambulatory Visit: Payer: Self-pay

## 2019-11-22 ENCOUNTER — Encounter: Payer: Self-pay | Admitting: Oncology

## 2019-11-22 ENCOUNTER — Ambulatory Visit: Payer: BC Managed Care – PPO | Admitting: Rheumatology

## 2019-11-22 VITALS — BP 132/72 | HR 62 | Temp 96.6°F | Resp 20 | Wt 271.8 lb

## 2019-11-22 DIAGNOSIS — C61 Malignant neoplasm of prostate: Secondary | ICD-10-CM | POA: Insufficient documentation

## 2019-11-22 DIAGNOSIS — R9721 Rising PSA following treatment for malignant neoplasm of prostate: Secondary | ICD-10-CM | POA: Diagnosis not present

## 2019-11-22 DIAGNOSIS — Z79899 Other long term (current) drug therapy: Secondary | ICD-10-CM | POA: Insufficient documentation

## 2019-11-22 DIAGNOSIS — Z923 Personal history of irradiation: Secondary | ICD-10-CM | POA: Insufficient documentation

## 2019-11-23 NOTE — Progress Notes (Signed)
[facility]  [phone] [phone]  ID: Mortimer Fries OB: 1951-03-26  MR#: [id number]  [id number]  Patient Care Team: Dion Body, MD as PCP - General (Family Medicine) Lloyd Huger, MD as Consulting Physician (Oncology)  CHIEF COMPLAINT: Recurrent prostate cancer.  INTERVAL HISTORY: Patient is a 69 year old male who reports being diagnosed with prostate cancer in approximately 2009-10.  He had local recurrence in 2017 with a reported Gleason score 4+5 and underwent XRT for local control.  He was recently noted to have an increasing PSA and was subsequently placed on Lupron and Erleada.  Patient reports his PSA is now undetectable.  He is referred to clinic for evaluation and consideration of additional imaging.  He currently feels well and is asymptomatic.  He has no neurologic complaints.  He denies any recent fevers or illnesses.  He has a good appetite and denies weight loss.  He has no chest pain, shortness of breath, cough, or hemoptysis.  He denies any nausea, vomiting, constipation, or diarrhea.  He has no urinary complaints.  Patient feels at his baseline offers no specific complaints today.  REVIEW OF SYSTEMS:   Review of Systems  Constitutional: Negative.  Negative for fever, malaise/fatigue and weight loss.  Respiratory: Negative for cough, hemoptysis and shortness of breath.   Cardiovascular: Negative.  Negative for chest pain and leg swelling.  Gastrointestinal: Negative.  Negative for abdominal pain.  Genitourinary: Negative.  Negative for dysuria.  Musculoskeletal: Negative.  Negative for back pain.  Skin: Negative.  Negative for rash.  Neurological: Negative.  Negative for dizziness, focal weakness, weakness and headaches.  Psychiatric/Behavioral: Negative.  The patient is not nervous/anxious.     As per HPI. Otherwise, a complete review of systems is negative.  PAST MEDICAL HISTORY: Past Medical History:   Diagnosis Date  . Arthritis   . Diabetes mellitus without complication ([place])   . Hypertension   . Prostate cancer ([place])   . Sleep apnea     PAST SURGICAL HISTORY: Past Surgical History:  Procedure Laterality Date  . APPENDECTOMY    . BACK SURGERY    . KNEE ARTHROPLASTY    . PROSTATE SURGERY      FAMILY HISTORY: Family History  Problem Relation Age of Onset  . Diabetes Sister   . Diabetes Maternal Grandmother   . Lung disease Brother   . Kidney failure Sister     ADVANCED DIRECTIVES (Y/N):  N  HEALTH MAINTENANCE: Social History   Tobacco Use  . Smoking status: Former Smoker    Packs/day: 2.00    Years: 30.00    Pack years: 60.00    Quit date: 07/05/1996    Years since quitting: 23.4  . Smokeless tobacco: Never Used  Substance Use Topics  . Alcohol use: Yes    Comment: daily   . Drug use: Not Currently     Colonoscopy:  PAP:  Bone density:  Lipid panel:  Allergies  Allergen Reactions  . Dilaudid [Hydromorphone Hcl] Itching    Current Outpatient Medications  Medication Sig Dispense Refill  . aspirin EC 81 MG tablet Take 81 mg by mouth daily.    . DULoxetine (CYMBALTA) 30 MG capsule Take 1 capsule (30 mg total) by mouth daily. 90 capsule 0  . ERLEADA 60 MG tablet Take 240 mg by mouth daily.    . fenofibrate 160 MG tablet TAKE 1 TABLET DAILY    . glipiZIDE (GLUCOTROL) 10 MG tablet Take 10 mg by mouth every morning.    [name]  HYDROcodone-acetaminophen (NORCO) 10-325 MG tablet Take 1 tablet by mouth every 6 (six) hours as needed.    . Lancets 30G MISC 1 each.    [name] LEUPROLIDE ACETATE, 6 MONTH, 45 MG injection     . Melatonin 10 MG TABS Take 10 mg by mouth daily.    . metFORMIN (GLUCOPHAGE-XR) 500 MG 24 hr tablet TAKE 2 TABLETS TWICE A DAY WITH MEALS    . metoprolol succinate (TOPROL-XL) 25 MG 24 hr tablet Take 25 mg by mouth 2 (two) times daily.    . quinapril-hydrochlorothiazide (ACCURETIC) 10-12.5 MG tablet Take 1 tablet by mouth daily.      No current  facility-administered medications for this visit.    OBJECTIVE: Vitals:   11/22/19 1516  BP: 132/72  Pulse: 62  Resp: 20  Temp: (!) 96.6 F (35.9 C)  SpO2: 97%     Body mass index is 41.33 kg/m.    ECOG FS:0 - Asymptomatic  General: Well-developed, well-nourished, no acute distress. Eyes: Pink conjunctiva, anicteric sclera. HEENT: Normocephalic, moist mucous membranes. Lungs: No audible wheezing or coughing. Heart: Regular rate and rhythm. Abdomen: Soft, nontender, no obvious distention. Musculoskeletal: No edema, cyanosis, or clubbing. Neuro: Alert, answering all questions appropriately. Cranial nerves grossly intact. Skin: No rashes or petechiae noted. Psych: Normal affect. Lymphatics: No cervical, calvicular, axillary or inguinal LAD.   LAB RESULTS:  Lab Results  Component Value Date   NA 139 12/14/2017   K 3.9 12/14/2017   CL 103 12/14/2017   CO2 28 12/14/2017   GLUCOSE 146 (H) 12/14/2017   BUN 22 12/14/2017   CREATININE 1.16 12/14/2017   CALCIUM 9.8 12/14/2017   PROT 7.1 12/14/2017   ALBUMIN 4.4 02/17/2017   AST 38 (H) 12/14/2017   ALT 75 (H) 12/14/2017   ALKPHOS 100 02/17/2017   BILITOT 0.9 12/14/2017   GFRNONAA 66 12/14/2017   GFRAA 76 12/14/2017    Lab Results  Component Value Date   WBC 5.4 12/14/2017   NEUTROABS 3,283 12/14/2017   HGB 15.5 12/14/2017   HCT 43.7 12/14/2017   MCV 90.7 12/14/2017   PLT 148 12/14/2017     STUDIES: No results found.  ASSESSMENT: Recurrent prostate cancer.  PLAN:    1.  Recurrent prostate cancer: Patient reports his initial diagnosis occurred in approximately 2009-10.  He had local recurrence in 2017 with a reported Gleason score 4+5 and underwent XRT for local control.  He was recently noted to have an increasing PSA and was subsequently placed on Lupron and Erleada.  Since reinitiating treatment, patient reports his PSA is now undetectable.  Despite improving PSA, patient is concerned with occult metastatic  disease.  Have recommended Axumin PET scan to obtain a baseline for comparison in the future if patient progresses through current treatment.  He is going out of town for the next several weeks, therefore will return to clinic in mid June with video assisted telemedicine visit to discuss his imaging results.   I spent a total of 60 minutes reviewing chart data, face-to-face evaluation with the patient, counseling and coordination of care as detailed above.  Patient expressed understanding and was in agreement with this plan. He also understands that He can call clinic at any time with any questions, concerns, or complaints.   Cancer Staging No matching staging information was found for the patient.  Lloyd Huger, MD   11/23/2019 12:44 PM

## 2019-12-13 ENCOUNTER — Other Ambulatory Visit: Payer: Self-pay

## 2019-12-13 ENCOUNTER — Ambulatory Visit
Admission: RE | Admit: 2019-12-13 | Discharge: 2019-12-13 | Disposition: A | Discharge status: Home / Self Care | Payer: Medicare Other | Source: Ambulatory Visit | Attending: Oncology | Admitting: Oncology

## 2019-12-13 DIAGNOSIS — C61 Malignant neoplasm of prostate: Secondary | ICD-10-CM

## 2019-12-13 MED ORDER — AXUMIN (FLUCICLOVINE F 18) INJECTION
10.7000 | Freq: Once | INTRAVENOUS | Status: AC | PRN
  Administered 2019-12-13: 10.7 via INTRAVENOUS

## 2019-12-17 NOTE — Progress Notes (Signed)
[facility]  [phone] [phone]  ID: Alexander Wilcox OB: 09/23/1950  MR#: [id number]  ACZ#:[id number]  Patient Care Team: Dion Body, MD as PCP - General (Family Medicine) Lloyd Huger, MD as Consulting Physician (Oncology)  I connected with Alexander Wilcox on 12/19/19 at 11:15 AM EDT by video enabled telemedicine visit and verified that I am speaking with the correct person using two identifiers.   I discussed the limitations, risks, security and privacy concerns of performing an evaluation and management service by telemedicine and the availability of in-person appointments. I also discussed with the patient that there may be a patient responsible charge related to this service. The patient expressed understanding and agreed to proceed.   Other persons participating in the visit and their role in the encounter: Patient, MD.  Patient's location: Home. Provider's location: Clinic.  CHIEF COMPLAINT: Recurrent prostate cancer.  INTERVAL HISTORY: Patient agreed to video assisted telemedicine visit for further evaluation and discussion of his PET scan results.  He continues to feel well and remains asymptomatic. He has no neurologic complaints.  He denies any recent fevers or illnesses.  He has a good appetite and denies weight loss.  He has no chest pain, shortness of breath, cough, or hemoptysis.  He denies any nausea, vomiting, constipation, or diarrhea.  He has no urinary complaints.  Patient offers no specific complaints today.  REVIEW OF SYSTEMS:   Review of Systems  Constitutional: Negative.  Negative for fever, malaise/fatigue and weight loss.  Respiratory: Negative for cough, hemoptysis and shortness of breath.   Cardiovascular: Negative.  Negative for chest pain and leg swelling.  Gastrointestinal: Negative.  Negative for abdominal pain.  Genitourinary: Negative.  Negative for dysuria.  Musculoskeletal: Negative.   Negative for back pain.  Skin: Negative.  Negative for rash.  Neurological: Negative.  Negative for dizziness, focal weakness, weakness and headaches.  Psychiatric/Behavioral: Negative.  The patient is not nervous/anxious.     As per HPI. Otherwise, a complete review of systems is negative.  PAST MEDICAL HISTORY: Past Medical History:  Diagnosis Date  . Arthritis   . Diabetes mellitus without complication ([place])   . Hypertension   . Prostate cancer ([place])   . Sleep apnea     PAST SURGICAL HISTORY: Past Surgical History:  Procedure Laterality Date  . APPENDECTOMY    . BACK SURGERY    . KNEE ARTHROPLASTY    . PROSTATE SURGERY      FAMILY HISTORY: Family History  Problem Relation Age of Onset  . Diabetes Sister   . Diabetes Maternal Grandmother   . Lung disease Brother   . Kidney failure Sister     ADVANCED DIRECTIVES (Y/N):  N  HEALTH MAINTENANCE: Social History   Tobacco Use  . Smoking status: Former Smoker    Packs/day: 2.00    Years: 30.00    Pack years: 60.00    Quit date: 07/05/1996    Years since quitting: 23.4  . Smokeless tobacco: Never Used  Vaping Use  . Vaping Use: Never used  Substance Use Topics  . Alcohol use: Yes    Comment: daily   . Drug use: Not Currently     Colonoscopy:  PAP:  Bone density:  Lipid panel:  Allergies  Allergen Reactions  . Dilaudid [Hydromorphone Hcl] Itching    Current Outpatient Medications  Medication Sig Dispense Refill  . aspirin EC 81 MG tablet Take 81 mg by mouth daily.    . DULoxetine (CYMBALTA)  30 MG capsule Take 1 capsule (30 mg total) by mouth daily. 90 capsule 0  . ERLEADA 60 MG tablet Take 240 mg by mouth daily.    . fenofibrate 160 MG tablet TAKE 1 TABLET DAILY    . glipiZIDE (GLUCOTROL) 10 MG tablet Take 10 mg by mouth every morning.    . Lancets 30G MISC 1 each.    [name] LEUPROLIDE ACETATE, 6 MONTH, 45 MG injection     . Melatonin 10 MG TABS Take 10 mg by mouth daily.    . metFORMIN (GLUCOPHAGE-XR)  500 MG 24 hr tablet TAKE 2 TABLETS TWICE A DAY WITH MEALS    . metoprolol succinate (TOPROL-XL) 25 MG 24 hr tablet Take 25 mg by mouth 2 (two) times daily.    . quinapril-hydrochlorothiazide (ACCURETIC) 10-12.5 MG tablet Take 1 tablet by mouth daily.     [name] HYDROcodone-acetaminophen (NORCO) 10-325 MG tablet Take 1 tablet by mouth every 6 (six) hours as needed. (Patient not taking: Reported on 12/19/2019)     No current facility-administered medications for this visit.    OBJECTIVE: There were no vitals filed for this visit.   There is no height or weight on file to calculate BMI.    ECOG FS:0 - Asymptomatic  General: Well-developed, well-nourished, no acute distress. HEENT: Normocephalic. Neuro: Alert, answering all questions appropriately. Cranial nerves grossly intact. Psych: Normal affect.  LAB RESULTS:  Lab Results  Component Value Date   NA 139 12/14/2017   K 3.9 12/14/2017   CL 103 12/14/2017   CO2 28 12/14/2017   GLUCOSE 146 (H) 12/14/2017   BUN 22 12/14/2017   CREATININE 1.16 12/14/2017   CALCIUM 9.8 12/14/2017   PROT 7.1 12/14/2017   ALBUMIN 4.4 02/17/2017   AST 38 (H) 12/14/2017   ALT 75 (H) 12/14/2017   ALKPHOS 100 02/17/2017   BILITOT 0.9 12/14/2017   GFRNONAA 66 12/14/2017   GFRAA 76 12/14/2017    Lab Results  Component Value Date   WBC 5.4 12/14/2017   NEUTROABS 3,283 12/14/2017   HGB 15.5 12/14/2017   HCT 43.7 12/14/2017   MCV 90.7 12/14/2017   PLT 148 12/14/2017     STUDIES: NM PET (AXUMIN) SKULL BASE TO MID THIGH  Result Date: 12/13/2019 CLINICAL DATA:  Prostate carcinoma with biochemical recurrence. EXAM: NUCLEAR MEDICINE PET SKULL BASE TO THIGH TECHNIQUE: 10.7 mCi F-18 Fluciclovine was injected intravenously. Full-ring PET imaging was performed from the skull base to thigh after the radiotracer. CT data was obtained and used for attenuation correction and anatomic localization. COMPARISON:  CT 03/13/2010 FINDINGS: NECK No radiotracer activity in  neck lymph nodes. Incidental CT finding: None CHEST No radiotracer accumulation within mediastinal or hilar lymph nodes. No suspicious pulmonary nodules on the CT scan. Incidental CT finding: None ABDOMEN/PELVIS Prostate: No focal activity in the prostate bed. Lymph nodes: No abnormal radiotracer accumulation within pelvic or abdominal nodes. Liver: No evidence of liver metastasis Incidental CT finding: Hepatic steatosis. Atherosclerotic calcification of the aorta. SKELETON No focal  activity to suggest skeletal metastasis. IMPRESSION: 1. No evidence local prostate cancer recurrence. 2. No evidence of visceral disease. 3. No skeletal metastasis. Electronically Signed   By: Suzy Bouchard M.D.   On: 12/13/2019 16:16    ASSESSMENT: Recurrent prostate cancer.  PLAN:    1.  Recurrent prostate cancer: Patient reports his initial diagnosis occurred in approximately 2009-10.  He had local recurrence in 2017 with a reported Gleason score 4+5 and underwent XRT for local control.  He was recently noted to have an increasing PSA and was subsequently placed on Lupron and Erleada.  Since reinitiating treatment, patient reports his PSA is now undetectable.  [facility] PET scan completed on December 13, 2019 reviewed independently and reported as above with no evidence of occult metastatic disease.  No further intervention is needed.  No follow-up is necessary in the cancer center.  Patient has been instructed to continue his current treatment with his primary urologist.  Please refer patient back if there are any questions or concerns.    I provided 20 minutes of face-to-face video visit time during this encounter which included chart review, counseling, and coordination of care as documented above.  Patient expressed understanding and was in agreement with this plan. He also understands that He can call clinic at any time with any questions, concerns, or complaints.   Cancer Staging No matching staging information was  found for the patient.  Lloyd Huger, MD   12/19/2019 1:28 PM

## 2019-12-19 ENCOUNTER — Encounter: Payer: Self-pay | Admitting: Oncology

## 2019-12-19 ENCOUNTER — Inpatient Hospital Stay: Payer: Medicare Other | Attending: Oncology | Admitting: Oncology

## 2019-12-19 DIAGNOSIS — C61 Malignant neoplasm of prostate: Secondary | ICD-10-CM | POA: Diagnosis not present

## 2019-12-19 DIAGNOSIS — Z8546 Personal history of malignant neoplasm of prostate: Secondary | ICD-10-CM | POA: Insufficient documentation

## 2019-12-19 DIAGNOSIS — Z8739 Personal history of other diseases of the musculoskeletal system and connective tissue: Secondary | ICD-10-CM | POA: Insufficient documentation

## 2019-12-19 DIAGNOSIS — Z79899 Other long term (current) drug therapy: Secondary | ICD-10-CM | POA: Insufficient documentation

## 2019-12-19 NOTE — Progress Notes (Signed)
Patient prescreened for appointment. Patient has no concerns or questions.  

## 2020-01-10 DIAGNOSIS — G6289 Other specified polyneuropathies: Secondary | ICD-10-CM | POA: Insufficient documentation

## 2020-01-10 DIAGNOSIS — E538 Deficiency of other specified B group vitamins: Secondary | ICD-10-CM | POA: Insufficient documentation

## 2020-05-05 NOTE — Progress Notes (Signed)
Office Visit Note  Patient: Alexander Wilcox             Date of Birth: [birth year]           MRN: [id number]             PCP: Dion Body, MD Referring: Dion Body, MD Visit Date: 05/19/2020 Occupation: @GUAROCC @  Subjective:  Pain in multiple joints.   History of Present Illness: Alexander Wilcox is a 69 y.o. male with history of ankylosing spondylitis and osteoarthritis.  He states he continues to have a lot of discomfort in his cervical thoracic and lumbar spine.  He has very limited range of motion.  He has discomfort in his bilateral hands, knee joints and his feet.  He has not had any episodes of plantar fasciitis.  He states he would like referral to pain management which she will discuss with his PCP.  He has been taking Cymbalta but he is not sure if it is effective.  He will try to taper off Cymbalta and see if it makes a difference or not.  Denies any joint swelling.  Activities of Daily Living:  Patient reports morning stiffness for 24 hours.   Patient Reports nocturnal pain.  Difficulty dressing/grooming: Reports Difficulty climbing stairs: Reports Difficulty getting out of chair: Reports Difficulty using hands for taps, buttons, cutlery, and/or writing: Reports  Review of Systems  Constitutional: Positive for fatigue.  HENT: Positive for mouth dryness and nose dryness. Negative for mouth sores.   Eyes: Positive for dryness. Negative for pain, itching and visual disturbance.  Respiratory: Positive for shortness of breath. Negative for cough, hemoptysis and difficulty breathing.   Cardiovascular: Positive for swelling in legs/feet. Negative for chest pain and palpitations.  Gastrointestinal: Negative for abdominal pain, blood in stool, constipation and diarrhea.  Endocrine: Negative for increased urination.  Genitourinary: Negative for painful urination.  Musculoskeletal: Positive for arthralgias, joint pain, myalgias, morning stiffness, muscle tenderness  and myalgias. Negative for joint swelling and muscle weakness.  Skin: Negative for color change, rash and redness.  Allergic/Immunologic: Negative for susceptible to infections.  Neurological: Negative for dizziness, numbness, headaches, memory loss and weakness.  Hematological: Negative for swollen glands.  Psychiatric/Behavioral: Positive for sleep disturbance. Negative for confusion.    PMFS History:  Patient Active Problem List   Diagnosis Date Noted  . History of rheumatoid arthritis 12/19/2019  . Medicare annual wellness visit, initial 09/18/2019  . Mild concentric left ventricular hypertrophy (LVH) 02/13/2019  . 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 History of hypertension 12/07/2016  . History of prostate cancer 12/07/2016  . Plantar fasciitis 12/07/2016  . Elevated LFTs 12/07/2016  . DDD (degenerative disc disease), lumbar s/p fusion  12/07/2016  . 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 DDD (degenerative disc disease), cervical s/p fusion  12/07/2016  . Primary osteoarthritis of both hands 12/07/2016  . Primary osteoarthritis of both knees 12/07/2016  . Primary osteoarthritis of both feet 12/07/2016  . Rheumatoid factor positive 12/07/2016  . History of sleep apnea 12/07/2016  . Hyperuricemia 12/07/2016  . Chronic right SI joint pain 12/07/2016  . DISH (diffuse idiopathic skeletal hyperostosis) 08/05/2016  . S/P cervical spinal fusion 08/05/2016  . Mixed hyperlipidemia 07/01/2016  . Type 2 diabetes mellitus with hyperlipidemia ([place]) 06/23/2015  . Essential hypertension 05/22/2014  . Morbid obesity due to excess calories ([place]) 05/22/2014  . OSA on CPAP 05/22/2014    Past Medical History:  Diagnosis Date  .  Arthritis   . Diabetes mellitus without complication ([facility])   . Hypertension   . Prostate cancer ([facility])   . Sleep apnea     Family History  Problem Relation Age of Onset  . Diabetes Sister   . Diabetes Maternal Grandmother    . Lung disease Brother   . Kidney failure Sister    Past Surgical History:  Procedure Laterality Date  . APPENDECTOMY    . BACK SURGERY    . KNEE ARTHROPLASTY    . PROSTATE SURGERY     Social History   Social History Narrative  . Not on file   Immunization History  Administered Date(s) Administered  . Janssen (J&J) SARS-COV-2 Vaccination 09/22/2019  . Moderna SARS-COVID-2 Vaccination 05/06/2020  . Tdap 07/05/2013     Objective: Vital Signs: BP 125/71 (BP Location: Left Arm, Patient Position: Sitting, Cuff Size: Small)   Pulse (!) 58   Ht 5\' 9"  (1.753 m)   Wt 265 lb (120.2 kg)   BMI 39.13 kg/m    Physical Exam Vitals and nursing note reviewed.  Constitutional:      Appearance: He is well-developed.  HENT:     Head: Normocephalic and atraumatic.  Eyes:     Conjunctiva/sclera: Conjunctivae normal.     Pupils: Pupils are equal, round, and reactive to light.  Cardiovascular:     Rate and Rhythm: Normal rate and regular rhythm.     Heart sounds: Normal heart sounds.  Pulmonary:     Effort: Pulmonary effort is normal.     Breath sounds: Normal breath sounds.  Abdominal:     General: Bowel sounds are normal.     Palpations: Abdomen is soft.  Musculoskeletal:     Cervical back: Normal range of motion and neck supple.  Skin:    General: Skin is warm and dry.     Capillary Refill: Capillary refill takes less than 2 seconds.  Neurological:     Mental Status: He is alert and oriented to person, place, and time.  Psychiatric:        Behavior: Behavior normal.      Musculoskeletal Exam: He has no range of motion in his cervical, thoracic and lumbar spine.  Shoulder joints, elbow joints with good range of motion.  He has bilateral PIP and DIP thickening with no synovitis.  He has limited range of motion of his hip joints.  He has incomplete extension of his knee joints.  He had no tenderness over MTPs or ankle joints. CDAI Exam: CDAI Score: -- Patient Global: --;  Provider Global: -- Swollen: --; Tender: -- Joint Exam 05/19/2020   No joint exam has been documented for this visit   There is currently no information documented on the homunculus. Go to the Rheumatology activity and complete the homunculus joint exam.  Investigation: No additional findings.  Imaging: No results found.  Recent Labs: Lab Results  Component Value Date   WBC 5.4 12/14/2017   HGB 15.5 12/14/2017   PLT 148 12/14/2017   NA 139 12/14/2017   K 3.9 12/14/2017   CL 103 12/14/2017   CO2 28 12/14/2017   GLUCOSE 146 (H) 12/14/2017   BUN 22 12/14/2017   CREATININE 1.16 12/14/2017   BILITOT 0.9 12/14/2017   ALKPHOS 100 02/17/2017   AST 38 (H) 12/14/2017   ALT 75 (H) 12/14/2017   PROT 7.1 12/14/2017   ALBUMIN 4.4 02/17/2017   CALCIUM 9.8 12/14/2017   GFRAA 76 12/14/2017    Speciality Comments: No specialty  comments available.  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  He was treated with the anti-TNF's in the past which she discontinued after prostate cancer.  I detailed discussion regarding immunosuppressive therapy which she declined.  Rheumatoid factor positive-he had no synovitis on examination.  Chronic right SI joint pain-he has pain in his entire spine and SI joints.  Plantar fasciitis-no recent episodes.  DDD (degenerative disc disease), cervical s/p fusion -he has almost no range of motion.  DDD (degenerative disc disease), lumbar s/p fusion -minimal range of motion.  DDD (degenerative disc disease), thoracic-thoracic kyphosis and limited range of motion.  Primary osteoarthritis of both hands-severe osteoarthritis in his hands.  Primary osteoarthritis of both knees-osteoarthritis in his knee joints causes discomfort as well.  Primary osteoarthritis of  both feet-proper fitting shoes were discussed.  Hyperuricemia-there is no history of gout.  Elevated LFTs-avoidance of Tylenol and ibuprofen was discussed.  History of prostate cancer  History of hypertension  History of sleep apnea  Educated about COVID-19 virus infection-he is fully immunized against COVID-19 and had a booster.  Use of mask, social distancing and hand hygiene was discussed.  Orders: No orders of the defined types were placed in this encounter.  No orders of the defined types were placed in this encounter.    Follow-Up Instructions: Return in about 1 year (around 05/19/2021) for Spondyloarthropathy.   Bo Merino, MD  Note - This record has been created using Editor, commissioning.  Chart creation errors have been sought, but may not always  have been located. Such creation errors do not reflect on  the standard of medical care.

## 2020-05-19 ENCOUNTER — Other Ambulatory Visit: Payer: Self-pay

## 2020-05-19 ENCOUNTER — Encounter: Payer: Self-pay | Admitting: Rheumatology

## 2020-05-19 ENCOUNTER — Ambulatory Visit: Payer: Medicare Other | Admitting: Rheumatology

## 2020-05-19 VITALS — BP 125/71 | HR 58 | Ht 69.0 in | Wt 265.0 lb

## 2020-05-19 DIAGNOSIS — R7989 Other specified abnormal findings of blood chemistry: Secondary | ICD-10-CM

## 2020-05-19 DIAGNOSIS — E790 Hyperuricemia without signs of inflammatory arthritis and tophaceous disease: Secondary | ICD-10-CM

## 2020-05-19 DIAGNOSIS — M5134 Other intervertebral disc degeneration, thoracic region: Secondary | ICD-10-CM

## 2020-05-19 DIAGNOSIS — M5030 Other cervical disc degeneration, unspecified cervical region: Secondary | ICD-10-CM

## 2020-05-19 DIAGNOSIS — M19041 Primary osteoarthritis, right hand: Secondary | ICD-10-CM

## 2020-05-19 DIAGNOSIS — M533 Sacrococcygeal disorders, not elsewhere classified: Secondary | ICD-10-CM | POA: Diagnosis not present

## 2020-05-19 DIAGNOSIS — M51369 Other intervertebral disc degeneration, lumbar region without mention of lumbar back pain or lower extremity pain: Secondary | ICD-10-CM

## 2020-05-19 DIAGNOSIS — M19042 Primary osteoarthritis, left hand: Secondary | ICD-10-CM

## 2020-05-19 DIAGNOSIS — M722 Plantar fascial fibromatosis: Secondary | ICD-10-CM | POA: Diagnosis not present

## 2020-05-19 DIAGNOSIS — Z8679 Personal history of other diseases of the circulatory system: Secondary | ICD-10-CM

## 2020-05-19 DIAGNOSIS — R768 Other specified abnormal immunological findings in serum: Secondary | ICD-10-CM | POA: Diagnosis not present

## 2020-05-19 DIAGNOSIS — Z8546 Personal history of malignant neoplasm of prostate: Secondary | ICD-10-CM

## 2020-05-19 DIAGNOSIS — G8929 Other chronic pain: Secondary | ICD-10-CM

## 2020-05-19 DIAGNOSIS — M19071 Primary osteoarthritis, right ankle and foot: Secondary | ICD-10-CM

## 2020-05-19 DIAGNOSIS — M47819 Spondylosis without myelopathy or radiculopathy, site unspecified: Secondary | ICD-10-CM | POA: Diagnosis not present

## 2020-05-19 DIAGNOSIS — M170 Bilateral primary osteoarthritis of knee: Secondary | ICD-10-CM

## 2020-05-19 DIAGNOSIS — Z8669 Personal history of other diseases of the nervous system and sense organs: Secondary | ICD-10-CM

## 2020-05-19 DIAGNOSIS — M19072 Primary osteoarthritis, left ankle and foot: Secondary | ICD-10-CM

## 2020-05-19 DIAGNOSIS — M5136 Other intervertebral disc degeneration, lumbar region: Secondary | ICD-10-CM

## 2020-06-24 DIAGNOSIS — K219 Gastro-esophageal reflux disease without esophagitis: Secondary | ICD-10-CM | POA: Insufficient documentation

## 2020-11-09 NOTE — Progress Notes (Signed)
Patient: Alexander Wilcox  Service Category: E/M  Provider: Gaspar Cola, MD  DOB: 09-25-1950  DOS: 11/10/2020  Referring Provider: Dion Body, MD  MRN: [id number]  Setting: Ambulatory outpatient  PCP: Dion Body, MD  Type: New Patient  Specialty: Interventional Pain Management    Location: Office  Delivery: Face-to-face     Primary Reason(s) for Visit: Encounter for initial evaluation of one or more chronic problems (new to examiner) potentially causing chronic pain, and posing a threat to normal musculoskeletal function. (Level of risk: High) CC: Foot Pain (Bilateral ), Hand Pain (Bilateral ), Shoulder Pain (Left s/p fx.  In PT ), and Knee Pain (Bilateral )  HPI  Alexander Wilcox is a 70 y.o. year old, male patient, who comes for the first time to our practice referred by Dion Body, MD for our initial evaluation of his chronic pain. 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 ; History of hypertension; History of prostate cancer; Plantar fasciitis; Elevated LFTs; DDD (degenerative disc disease), lumbar s/p fusion ; DDD (degenerative disc disease), thoracic; DDD (degenerative disc disease), cervical s/p fusion ; Primary osteoarthritis of both hands; Primary osteoarthritis of both knees; Primary osteoarthritis of both feet; Rheumatoid factor positive; History of sleep apnea; Hyperuricemia; Chronic right SI joint pain; DISH (diffuse idiopathic skeletal hyperostosis); Essential hypertension; History of rheumatoid arthritis; Medicare annual wellness visit, initial; Mild concentric left ventricular hypertrophy (LVH); Mixed hyperlipidemia; Morbid obesity due to excess calories ([place]); OSA on CPAP; History of fusion of cervical spine; Type 2 diabetes mellitus with hyperlipidemia ([place]); B12 deficiency; Disease related peripheral neuropathy; Gastroesophageal reflux disease without esophagitis; Chronic pain syndrome; Pharmacologic therapy; Disorder of skeletal system; Problems  influencing health status; Abnormal MRI, cervical spine (02/13/2009); Abnormal CT scan, cervical spine (06/17/2014); Abnormal MRI, lumbar spine (02/05/2013); Failed back surgical syndrome; History of lumbar surgery; Cervical spondylosis with myelopathy and radiculopathy; Cervical radiculitis (intermittent); Chronic upper extremity pain (Bilateral) (intermittent); and Chronic lower extremity pain (Bilateral) (intermittent) on their problem list. Today he comes in for evaluation of his Foot Pain (Bilateral ), Hand Pain (Bilateral ), Shoulder Pain (Left s/p fx.  In PT ), and Knee Pain (Bilateral )  Pain Assessment: Location: Left,Right Foot (hands, left shoulder) Radiating: sometimes up the back of the legs. Onset: More than a month ago Duration: Chronic pain Quality: Discomfort,Constant,Tingling,Numbness,Other (Comment) (bottoms of feet feel like he is walking on a marshmallow) Severity: 3 /10 (subjective, self-reported pain score)  Effect on ADL: gait is afffected, hot flashes. Timing: Constant Modifying factors: nothing currently BP: 138/78  HR: 87  Onset and Duration: Gradual and Date of onset: 2020 Cause of pain: Unknown Severity: Getting worse, NAS-11 at its worse: 8/10, NAS-11 at its best: 3/10, NAS-11 now: 3/10 and NAS-11 on the average: 3/10 Timing: Not influenced by the time of the day Aggravating Factors: Prolonged sitting Alleviating Factors: denies Associated Problems: Night-time cramps, Numbness, Sweating, Tingling and Pain that wakes patient up Quality of Pain: Aching, Sharp, Shooting and Tingling Previous Examinations or Tests: The patient denies . Previous Treatments: The patient denies .  According to the patient and he was sent over to [place] to evaluate and treat his upper and lower extremity peripheral neuropathy.  According to the patient he has already tried gabapentin for approximately a month where he was taking the gabapentin 300 mg 3 tablets p.o. 3 times daily.  He also  describes having tried Lyrica for 1 month PAD, but neither one of them provide him with any relief of the  pain.  He denies having had a nerve conduction test and he describes the pain as an electrical-like sensation that he has had for the past 18 months on an intermittent basis.  He refers that it started with tingling in both of his feet then it moved to his calf and then to the hands.  He refers having had some experience with the use of he states that his diabetes started with Enbrel, when he was being treated for his prostate cancer.  He was recently told that they recommended that he go back on it but he states that he thinks the cancer came from the medication and he has already gone through rounds with this prostate cancer and he is not interested in trying that again.  He refers having had cervical spine surgery approximately 14 years ago by Dr. Glenna Fellows and having also been treated at a  pain facility where he had an injection in the cervical spine that did help.  He recently was given some prednisone for several fire ant bites that triggered anaphylactic reaction.  Since then his blood sugar has been out of control.  He also refers having to deal with left shoulder pain secondary to a fracture and he mentioned that when he had that neck surgery, he woke up paralyzed and it took a while to recover from that.  He has had radiation for the prostate cancer and he thinks that his diabetes might of come from that.  Interestingly, when he was recently given the prednisone he stated that it did not help with the neuropathy.  He had some oxycodone given to him for the shoulder pain and this seems to have helped some of the neuropathy pain.  The patient's description of the pain and the pain pattern makes me think that this may be coming from the cervical region where he may be having a cervical spondylosis with myelopathy and radiculopathy.  Today I took the time to provide the patient with information  regarding my pain practice. The patient was informed that my practice is divided into two sections: an interventional pain management section, as well as a completely separate and distinct medication management section. I explained that I have procedure days for my interventional therapies, and evaluation days for follow-ups and medication management. Because of the amount of documentation required during both, they are kept separated. This means that there is the possibility that he may be scheduled for a procedure on one day, and medication management the next. I have also informed him that because of staffing and facility limitations, I no longer take patients for medication management only. To illustrate the reasons for this, I gave the patient the example of surgeons, and how inappropriate it would be to refer a patient to his/her care, just to write for the post-surgical antibiotics on a surgery done by a different surgeon.   Because interventional pain management is my board-certified specialty, the patient was informed that joining my practice means that they are open to any and all interventional therapies. I made it clear that this does not mean that they will be forced to have any procedures done. What this means is that I believe interventional therapies to be essential part of the diagnosis and proper management of chronic pain conditions. Therefore, patients not interested in these interventional alternatives will be better served under the care of a different practitioner.  The patient was also made aware of my Comprehensive Pain Management Safety Guidelines where by joining  my practice, they limit all of their nerve blocks and joint injections to those done by our practice, for as long as we are retained to manage their care.   Historic Controlled Substance Pharmacotherapy Review  PMP and historical list of controlled substances: Oxycodone IR 10 mg, 1 tab p.o. 3 times daily Current opioid  analgesics: Oxycodone IR 10 mg, 1 tab p.o. 3 times daily (08/29/2020) MME/day: 45 mg/day  Historical Monitoring: The patient  reports previous drug use. List of all UDS Test(s): No results found for: MDMA, COCAINSCRNUR, [place], [place], CANNABQUANT, THCU, Power List of other Serum/Urine Drug Screening Test(s):  No results found for: AMPHSCRSER, BARBSCRSER, BENZOSCRSER, COCAINSCRSER, COCAINSCRNUR, PCPSCRSER, PCPQUANT, THCSCRSER, THCU, CANNABQUANT, OPIATESCRSER, OXYSCRSER, PROPOXSCRSER, ETH Historical Background Evaluation: [place] PMP: PDMP reviewed during this encounter. Online review of the past 57-monthperiod conducted.             PMP NARX Score Report:  Narcotic: 330 Sedative: 260 Stimulant: 000 [place] Department of public safety, offender search: (Editor, commissioningInformation) Non-contributory Risk Assessment Profile: Aberrant behavior: None observed or detected today Risk factors for fatal opioid overdose: None identified today PMP NARX Overdose Risk Score: 500 Fatal overdose hazard ratio (HR): Calculation deferred Non-fatal overdose hazard ratio (HR): Calculation deferred Risk of opioid abuse or dependence: 0.7-3.0% with doses ? 36 MME/day and 6.1-26% with doses ? 120 MME/day. Substance use disorder (SUD) risk level: See below Personal History of Substance Abuse (SUD-Substance use disorder):  Alcohol: Negative  Illegal Drugs: Negative  Rx Drugs: Negative  ORT Risk Level calculation: Low Risk  Opioid Risk Tool - 11/10/20 1107      Family History of Substance Abuse   Alcohol Negative    Illegal Drugs Negative    Rx Drugs Negative      Personal History of Substance Abuse   Alcohol Negative    Illegal Drugs Negative    Rx Drugs Negative      Age   Age between 179-45years  No      Psychological Disease   Psychological Disease Negative    Depression Negative      Total Score   Opioid Risk Tool Scoring 0    Opioid Risk Interpretation Low Risk          ORT Scoring interpretation  table:  Score <3 = Low Risk for SUD  Score between 4-7 = Moderate Risk for SUD  Score >8 = High Risk for Opioid Abuse   PHQ-2 Depression Scale:  Total score:    PHQ-2 Scoring interpretation table: (Score and probability of major depressive disorder)  Score 0 = No depression  Score 1 = 15.4% Probability  Score 2 = 21.1% Probability  Score 3 = 38.4% Probability  Score 4 = 45.5% Probability  Score 5 = 56.4% Probability  Score 6 = 78.6% Probability   PHQ-9 Depression Scale:  Total score:    PHQ-9 Scoring interpretation table:  Score 0-4 = No depression  Score 5-9 = Mild depression  Score 10-14 = Moderate depression  Score 15-19 = Moderately severe depression  Score 20-27 = Severe depression (2.4 times higher risk of SUD and 2.89 times higher risk of overuse)   Pharmacologic Plan: As per protocol, I have not taken over any controlled substance management, pending the results of ordered tests and/or consults.            Initial impression: Pending review of available data and ordered tests.  Meds   Current Outpatient Medications:  .  aspirin EC 81  MG tablet, Take 81 mg by mouth daily., Disp: , Rfl:  .  cyanocobalamin 1000 MCG tablet, Take 1,000 mcg by mouth daily., Disp: , Rfl:  .  DULoxetine (CYMBALTA) 30 MG capsule, Take 1 capsule (30 mg total) by mouth daily., Disp: 90 capsule, Rfl: 0 .  ERLEADA 60 MG tablet, Take 240 mg by mouth daily., Disp: , Rfl:  .  fenofibrate 160 MG tablet, TAKE 1 TABLET DAILY, Disp: , Rfl:  .  glipiZIDE (GLUCOTROL) 10 MG tablet, Take 20 mg by mouth every morning., Disp: , Rfl:  .  Lancets 30G MISC, 1 each., Disp: , Rfl:  .  metFORMIN (GLUCOPHAGE-XR) 500 MG 24 hr tablet, Take 2 tablets by mouth 2 (two) times daily., Disp: , Rfl:  .  metoprolol succinate (TOPROL-XL) 25 MG 24 hr tablet, Take 25 mg by mouth 2 (two) times daily., Disp: , Rfl:  .  omeprazole (PRILOSEC) 40 MG capsule, Take 40 mg by mouth daily., Disp: , Rfl:  .  quinapril-hydrochlorothiazide  (ACCURETIC) 10-12.5 MG tablet, Take 1 tablet by mouth daily. , Disp: , Rfl:  .  solifenacin (VESICARE) 10 MG tablet, Take 10 mg by mouth daily., Disp: , Rfl:  .  tamsulosin (FLOMAX) 0.4 MG CAPS capsule, Take 0.4 mg by mouth daily., Disp: , Rfl:  .  gabapentin (NEURONTIN) 300 MG capsule, Take by mouth. Patient is tapering off of Gabapentin currently. (Patient not taking: Reported on 11/10/2020), Disp: , Rfl:  .  HYDROcodone-acetaminophen (NORCO) 10-325 MG tablet, Take 1 tablet by mouth every 6 (six) hours as needed.  (Patient not taking: Reported on 11/10/2020), Disp: , Rfl:  .  LEUPROLIDE ACETATE, 6 MONTH, 45 MG injection, Inject 45 mg into the skin every 6 (six) months., Disp: , Rfl:  .  Melatonin 10 MG TABS, Take 10 mg by mouth daily. (Patient not taking: Reported on 11/10/2020), Disp: , Rfl:   Imaging Review  Cervical Imaging: Cervical MR w/wo contrast: Results for orders placed during the hospital encounter of 02/13/09 MR Cervical Spine W Wo Contrast  Narrative Clinical Data: Pain and stiffness in the neck and back.  Cervical spondylosis.  Prior cervical surgery in January 2008.  Prostate cancer.  MRI CERVICAL SPINE WITHOUT AND WITH CONTRAST  Technique:  Multiplanar and multiecho pulse sequences of the cervical spine, to include the craniocervical junction and cervicothoracic junction, were obtained according to standard protocol without and with intravenous contrast.  Contrast: 20 ml Multihance  Comparison: 08/31/2007  Findings: Again noted is a prior operative intervention at C3-4 with susceptibility artifact associated with posterior facet screws.  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  The craniocervical junction appears  unremarkable.  There is straightening of the normal cervical lordosis.  No significant abnormal vertebral body edema is identified.  No specific findings of osseous metastatic disease to the cervical spine noted.  At the extreme inferior margin of the sagittal images, there is a suggestion of in the anterior extradural structure causing central stenosis at the T2-3 level.  This appears similar in magnitude to the prior exam.  Additional findings at individual levels are as follows:  C2-3:  Ossification of posterior longitudinal ligament noted. The AP diameter of the thecal sac is 9 mm, compatible with mild central stenosis. The  appearance is not significantly changed.  C3-4:  Ossification of the posterior longitudinal ligament noted. The AP diameter of the thecal sac is 8 mm compatible with moderate central stenosis. Uncinate spurring causes borderline right foraminal stenosis.  C4-5:  Mild facet arthropathy noted without overt stenosis.  C5-6:  Broad chronic disc-osteophyte complex noted with uncinate spurring contributing to moderate right and mild left neural foraminal stenosis, unchanged.  C6-7:  Mild right eccentric disc bulge, without stenosis.  C7-T1:  Unremarkable.  IMPRESSION:  1.  Unchanged appearance of the cervical spine, with mild to moderate multifactorial central and foraminal stenoses but without evidence of significant progression or metastatic disease to the cervical spine. 2.  Unchanged appearance of central myelomalacia at the C3-4 level.  Provider: Weyman Rodney  Cervical CT wo contrast: Results for orders placed during the hospital encounter of 06/17/14 CT Cervical Spine Wo Contrast  Narrative CLINICAL DATA:  Cervical spondylosis. Progressive pain over the last 6 months, left greater than right.  EXAM: CT CERVICAL SPINE WITHOUT CONTRAST  TECHNIQUE: Multidetector CT imaging of the cervical spine was performed without intravenous contrast.  Multiplanar CT image reconstructions were also generated.  COMPARISON:  MRI of the cervical spine 02/13/2009.  FINDINGS: The cervical spine is imaged from the skullbase through T1-2. Ossification of posterior longitudinal ligament is present from the tip of the basal E on the Bruce C4-5. Fusion of anterior osteophytes is present from the C4-5 through C7-T1. There is fusion of the atlantoaxial joints bilaterally. Osteophyte formation and degenerative changes are noted at C1-2 lateral mass articulations bilaterally.  Posterior surgical fusion is noted C2-3 and C3-4 with lateral mass screws at C3-4 bilaterally. The posterior elements are fused at C5-6 scratch the the posterior elements are fused at C4-5 and C5-6 is well.  C1-2:  The foramina are patent.  C2-3: Central canal stenosis is secondary to the ossification of posterior longitudinal ligament. The foramina patent.  C3-4: Mild uncovertebral spurring and bilateral foraminal stenosis is evident.  C4-5:  Mild foraminal narrowing is worse on the left.  C5-6: Mild foraminal narrowing is worse on the left. The patient is status post left laminectomy.  C6-7: Mild foraminal narrowing is worse on the right due to uncovertebral spurring.  C7-T1:  Mild left foraminal stenosis is present.  IMPRESSION: 1. Ossification of the posterior longitudinal ligament from the tip of the base she on through C4-5. 2. With fusion of the atlantoaxial joint. 3. Degenerative lateral mass reticulation at C1-2. 4. Fusion of anterior osteophytes the C4-5 through C7-T1. 5. Surgical posterior fusion at C3-4 with additional posterior element fusion evident at C2-3, C4-5, and C5-6. 6. Multilevel foraminal narrowing as described due to uncovertebral and facet spurring.   Electronically Signed By: Lawrence Santiago M.D. On: 06/17/2014 16:15  Lumbosacral Imaging: Lumbar MR wo contrast: Results for orders placed during the hospital encounter of  06/16/11 MR Lumbar Spine Wo Contrast  Narrative *RADIOLOGY REPORT*  Clinical Data: 70 year old male with low back, left hip and lower extremity pain.  Weakness.  Prior surgery.  MRI LUMBAR SPINE WITHOUT CONTRAST  Technique:  Multiplanar and multiecho pulse sequences of the lumbar spine were obtained without intravenous contrast.  Comparison: Intraoperative radiographs 09/03/2005.  Findings: Large body habitus.  Wrap artifact on axial images. Small T2 hyperintense cortical lesion of the right renal mid pole, favor benign cyst.  Otherwise negative visualized abdominal viscera.  Conus medullaris partially visible at T11-T12, grossly normal.  Postoperative change to the posterior paraspinal soft tissues at L5- L1  with denervation changes.  See additional details of that level below.  Mild degenerative endplate marrow edema at L2-L3. Incidental benign L2 vertebral body hemangiomas.  T11-T12:  Negative.  T12-L1:  Negative.  L1-L2:  Facet hypertrophy worse on the left.  A space-occupying lesion in the left posterior epidural space (series 6 and series 4 image 6). This is nonspecific but might reflect an oblong synovial cyst.  Subsequent mass effect on the posterior thecal sac with borderline to mild spinal stenosis.  Mild left L1 foraminal stenosis.  L2-L3:  Negative disc.  Facet hypertrophy.  Borderline to mild spinal and bilateral L2 foraminal stenosis.  L3-L4:  Disc desiccation and mild bulge.  Facet and ligament flavum hypertrophy.  Mild epidural lipomatosis.  Moderate spinal and mild bilateral L3 foraminal stenosis.  L4-L5:  Disc desiccation and circumferential disc bulge eccentric to the left.  Severe facet hypertrophy.  Mild to moderate spinal stenosis.  Moderate bilateral lateral recess stenosis.  Moderate left and mild right L4 foraminal stenosis.  L5-S1:  Sequelae of partial laminectomy.  Height T1 and T2 signal within the disc which is bulging eccentric to the  right.  There is persistent mass effect on the right lateral recess displacing the right S1 nerve root (series 2 image 5).  Additional distortion at the right lateral recess may be related to granulation tissue. Residual posterior element hypertrophy.  Mild left L5 foraminal stenosis.  No significant spinal stenosis.  Sacral spinal canal epidural lipomatosis.  IMPRESSION:  1.  Postoperative changes at L5-S1 more so on the right.  The descending right S1 nerve root is deviated at the right lateral recess, and the appearance is suspicious for a disc protrusion, although some granulation tissue is suspected to contribute to the appearance. 2.  Multifactorial mild to moderate spinal, lateral recess, and bilateral foraminal stenosis at L3-L4 and L4-L5. 3.  Low signal space-occupying lesion in the left posterior epidural space at L1-L2 is nonspecific, but I favor an oblong synovial cyst. Another possibility is trace epidural hematoma of degenerative etiology.  Subsequent mass effect on the posterior thecal sac (series 4 image 6).  Original Report Authenticated By: Randall An, M.D.  Lumbar MR w/wo contrast: Results for orders placed during the hospital encounter of 02/05/13 MR Lumbar Spine W Wo Contrast  Addendum 02/06/2013  9:01 AM **ADDENDUM** CREATED: 02/06/2013 08:59:15  Two-view small hemangiomata in the L2 vertebral body are unchanged. There is disc degeneration with bone marrow edema and enhancement at L2-3, slightly progressive from the prior study.  **END ADDENDUM** SIGNED BY: Monia Sabal. Carlis Abbott, M.D.  Narrative *RADIOLOGY REPORT*  Clinical Data: Lumbar spondylosis.  Back surgery.  Prostate cancer  BUN and creatinine were obtained on site at [facility] at [address] Results:  BUN 19 mg/dL,  Creatinine 1.0 mg/dL.  MRI LUMBAR SPINE WITHOUT AND WITH CONTRAST  Technique:  Multiplanar and multiecho pulse sequences of the lumbar spine were obtained without  and with intravenous contrast.  Contrast: 105m MULTIHANCE GADOBENATE DIMEGLUMINE 529 MG/ML IV SOLN  Comparison: Lumbar MRI 06/16/2011  Findings: Prior laminectomy on the right L5-S1.  The lumbar alignment is normal.  Negative for fracture.  Conus medullaris is normal and terminates at T12-L1.  T12-L1:  Mild facet degeneration.  Negative for spinal stenosis  L1-2:  Mild facet hypertrophy bilaterally.  Ovoid soft tissue mass between the dura and facet joint on the left is unchanged from prior study.  This has intermediate signal on T1 and T2 and does not  enhance.  There is mild flattening of the posterior dura without significant spinal stenosis.  This may represent a synovial cyst/focal ligament thickening.  L2-3:  Normal disc.  Moderate facet hypertrophy with mild spinal stenosis  L3-4:  Mild disc bulging.  Moderate facet hypertrophy bilaterally with moderately severe spinal stenosis.  There is foraminal encroachment bilaterally.  L4-5:  Disc degeneration with disc bulging.  Annular tear and a small lateral disc protrusion on the left with associated osteophyte.  Disc protrusion has progressed since the prior study. There is mild to moderate spinal stenosis which is unchanged. There is bilateral facet hypertrophy  L5-S1:  Postop laminectomy on the right.  There is a moderately large right-sided osteophyte which is unchanged.  This is causing displacement of the right S1 nerve root.  There appears to be adequate decompression of the foramen.  Overall no change in this level from the prior study.  IMPRESSION: Ovoid soft tissue nonenhancing structure between the dura and facet joint on the left at L1-2.  This may represent a synovial cyst and is not causing significant neural impingement.  Moderate spinal stenosis L3-4.  Mild stenosis at L4-5.  Left foraminal and left lateral disc protrusion has progressed in the interval.  Postop laminectomy on the right at L5-S1.  There  is prominent spurring and displacement of the right S1 nerve root, unchanged from the  prior study.  No recurrent disc protrusion.   Original Report Authenticated By: Carl Best, M.D.  Complexity Note: Imaging results reviewed. Results shared with Alexander Wilcox, using Layman's terms.                        ROS  Cardiovascular: Daily Aspirin intake and High blood pressure Pulmonary or Respiratory: Snoring  and Temporary stoppage of breathing during sleep Neurological: Abnormal skin sensations (Peripheral Neuropathy) Psychological-Psychiatric: No reported psychological or psychiatric signs or symptoms such as difficulty sleeping, anxiety, depression, delusions or hallucinations (schizophrenial), mood swings (bipolar disorders) or suicidal ideations or attempts Gastrointestinal: Reflux or heatburn Genitourinary: Passing kidney stones Hematological: No reported hematological signs or symptoms such as prolonged bleeding, low or poor functioning platelets, bruising or bleeding easily, hereditary bleeding problems, low energy levels due to low hemoglobin or being anemic Endocrine: High blood sugar controlled without the use of insulin (NIDDM) Rheumatologic: Rheumatoid arthritis Musculoskeletal: Negative for myasthenia gravis, muscular dystrophy, multiple sclerosis or malignant hyperthermia Work History: Disabled since 2010  Allergies  Alexander Wilcox is allergic to dilaudid [hydromorphone hcl].  Laboratory Chemistry Profile   Renal Lab Results  Component Value Date   BUN 22 12/14/2017   CREATININE 1.16 81/27/5170   BCR NOT APPLICABLE 01/74/9449   GFRAA 76 12/14/2017   GFRNONAA 66 12/14/2017   PROTEINUR NEGATIVE 06/10/2008     Electrolytes Lab Results  Component Value Date   NA 139 12/14/2017   K 3.9 12/14/2017   CL 103 12/14/2017   CALCIUM 9.8 12/14/2017     Hepatic Lab Results  Component Value Date   AST 38 (H) 12/14/2017   ALT 75 (H) 12/14/2017   ALBUMIN 4.4 02/17/2017    ALKPHOS 100 02/17/2017     ID No results found for: LYMEIGGIGMAB, HIV, SARSCOV2NAA, STAPHAUREUS, MRSAPCR, HCVAB, PREGTESTUR, RMSFIGG, QFVRPH1IGG, QFVRPH2IGG, LYMEIGGIGMAB   Bone No results found for: VD25OH, [id number], ZL9357SV7, BL3903ES9, 25OHVITD1, 25OHVITD2, 25OHVITD3, TESTOFREE, TESTOSTERONE   Endocrine Lab Results  Component Value Date   GLUCOSE 146 (H) 12/14/2017   GLUCOSEU NEGATIVE 06/10/2008     Neuropathy No  results found for: VITAMINB12, FOLATE, HGBA1C, HIV   CNS No results found for: COLORCSF, APPEARCSF, RBCCOUNTCSF, WBCCSF, POLYSCSF, LYMPHSCSF, EOSCSF, PROTEINCSF, GLUCCSF, JCVIRUS, CSFOLI, IGGCSF, LABACHR, ACETBL, LABACHR, ACETBL   Inflammation (CRP: Acute  ESR: Chronic) No results found for: CRP, ESRSEDRATE, LATICACIDVEN   Rheumatology No results found for: RF, ANA, LABURIC, URICUR, LYMEIGGIGMAB, LYMEABIGMQN, HLAB27   Coagulation Lab Results  Component Value Date   INR 1.0 06/10/2008   LABPROT 13.1 06/10/2008   APTT 28 06/10/2008   PLT 148 12/14/2017     Cardiovascular Lab Results  Component Value Date   HGB 15.5 12/14/2017   HCT 43.7 12/14/2017     Screening No results found for: SARSCOV2NAA, COVIDSOURCE, STAPHAUREUS, MRSAPCR, HCVAB, HIV, PREGTESTUR   Cancer No results found for: CEA, CA125, LABCA2   Allergens No results found for: ALMOND, APPLE, ASPARAGUS, AVOCADO, BANANA, BARLEY, BASIL, BAYLEAF, GREENBEAN, LIMABEAN, WHITEBEAN, BEEFIGE, REDBEET, BLUEBERRY, BROCCOLI, CABBAGE, MELON, CARROT, CASEIN, CASHEWNUT, CAULIFLOWER, CELERY     Note: Lab results reviewed.  [place]  Drug: Alexander Wilcox  reports previous drug use. Alcohol:  reports current alcohol use. Tobacco:  reports that he quit smoking about 24 years ago. He has a 60.00 pack-year smoking history. He has never used smokeless tobacco. Medical:  has a past medical history of Arthritis, Diabetes mellitus without complication ([place]), Hypertension, Prostate cancer ([place]), and Sleep apnea. Family:  family history includes Diabetes in his maternal grandmother and sister; Kidney failure in his sister; Lung disease in his brother.  Past Surgical History:  Procedure Laterality Date  . APPENDECTOMY    . BACK SURGERY    . KNEE ARTHROPLASTY    . PROSTATE SURGERY     Active Ambulatory Problems    Diagnosis Date Noted  . 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 History of hypertension 12/07/2016  . History of prostate cancer 12/07/2016  . Plantar fasciitis 12/07/2016  . Elevated LFTs 12/07/2016  . DDD (degenerative disc disease), lumbar s/p fusion  12/07/2016  . 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 DDD (degenerative disc disease), cervical s/p fusion  12/07/2016  . Primary osteoarthritis of both hands 12/07/2016  . Primary osteoarthritis of both knees 12/07/2016  . Primary osteoarthritis of both feet 12/07/2016  . Rheumatoid factor positive 12/07/2016  . History of sleep apnea 12/07/2016  . Hyperuricemia 12/07/2016  . Chronic right SI joint pain 12/07/2016  . DISH (diffuse idiopathic skeletal hyperostosis) 08/05/2016  . Essential hypertension 05/22/2014  . History of rheumatoid arthritis 12/19/2019  . Medicare annual wellness visit, initial 09/18/2019  . Mild concentric left ventricular hypertrophy (LVH) 02/13/2019  . Mixed hyperlipidemia 07/01/2016  . Morbid obesity due to excess calories ([place]) 05/22/2014  . OSA on CPAP 05/22/2014  . History of fusion of cervical spine 08/05/2016  . Type 2 diabetes mellitus with hyperlipidemia ([place]) 06/23/2015  . B12 deficiency 01/10/2020  . Disease related peripheral neuropathy 01/10/2020  . Gastroesophageal reflux disease without esophagitis 06/24/2020  . Chronic pain syndrome 11/10/2020  . Pharmacologic therapy 11/10/2020  . Disorder of skeletal system 11/10/2020  . Problems influencing health status 11/10/2020  . Abnormal MRI, cervical spine (02/13/2009) 11/10/2020  . Abnormal CT scan, cervical spine  (06/17/2014) 11/10/2020  . Abnormal MRI, lumbar spine (02/05/2013) 11/10/2020  . 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 History of lumbar surgery 11/10/2020  . Cervical spondylosis with myelopathy and radiculopathy 11/10/2020  . Cervical radiculitis (intermittent) 11/10/2020  . Chronic upper extremity pain (Bilateral) (intermittent) 11/10/2020  . Chronic lower  extremity pain (Bilateral) (intermittent) 11/10/2020   Resolved Ambulatory Problems    Diagnosis Date Noted  . No Resolved Ambulatory Problems   Past Medical History:  Diagnosis Date  . Arthritis   . Diabetes mellitus without complication ([place])   . Hypertension   . Prostate cancer ([place])   . Sleep apnea    Constitutional Exam  General appearance: Well nourished, well developed, and well hydrated. In no apparent acute distress Vitals:   11/10/20 1054  BP: 138/78  Pulse: 87  Resp: 16  Temp: (!) 96.9 F (36.1 C)  TempSrc: Temporal  SpO2: 98%  Weight: 243 lb (110.2 kg)  Height: _0  (1.727 m)   BMI Assessment: Estimated body mass index is 36.95 kg/m as calculated from the following:   Height as of this encounter: _1  (1.727 m).   Weight as of this encounter: 243 lb (110.2 kg).  BMI interpretation table: BMI level Category Range association with higher incidence of chronic pain  <18 kg/m2 Underweight   18.5-24.9 kg/m2 Ideal body weight   25-29.9 kg/m2 Overweight Increased incidence by 20%  30-34.9 kg/m2 Obese (Class I) Increased incidence by 68%  35-39.9 kg/m2 Severe obesity (Class II) Increased incidence by 136%  >40 kg/m2 Extreme obesity (Class III) Increased incidence by 254%   Patient's current BMI Ideal Body weight  Body mass index is 36.95 kg/m. Ideal body weight: 68.4 kg (150 lb 12.7 oz) Adjusted ideal body weight: 85.1 kg (187 lb 10.8 oz)   BMI Readings from Last 4 Encounters:  11/10/20 36.95 kg/m  05/19/20 39.13 kg/m  11/22/19 41.33 kg/m  11/19/19 41.36 kg/m   Wt Readings from Last 4  Encounters:  11/10/20 243 lb (110.2 kg)  05/19/20 265 lb (120.2 kg)  11/22/19 271 lb 12.8 oz (123.3 kg)  11/19/19 272 lb (123.4 kg)    Psych/Mental status: Alert, oriented x 3 (person, place, & time)       Eyes: PERLA Respiratory: No evidence of acute respiratory distress  Assessment  Primary Diagnosis & Pertinent Problem List: The primary encounter diagnosis was Chronic pain syndrome. 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 , DDD (degenerative disc disease), cervical s/p fusion , History of fusion of cervical spine, Abnormal MRI, cervical spine (02/13/2009), Abnormal CT scan, cervical spine (06/17/2014), Abnormal MRI, lumbar spine (02/05/2013), History of lumbar surgery, Chronic lower extremity pain (Bilateral) (intermittent), DDD (degenerative disc disease), lumbar s/p fusion , DDD (degenerative disc disease), thoracic, Disease related peripheral neuropathy, Pharmacologic therapy, Disorder of skeletal system, Problems influencing health status, B12 deficiency, Elevated LFTs, Hyperuricemia, and Type 2 diabetes mellitus with hyperlipidemia ([place]) were also pertinent to this visit.  Visit Diagnosis (New problems to examiner): 1. Chronic pain syndrome   2. DISH (diffuse idiopathic skeletal hyperostosis)   3. Cervicalgia   4. Chronic upper extremity pain (Bilateral) (intermittent)   5. Cervical radiculitis (intermittent)   6. Cervical spondylosis with myelopathy and radiculopathy   7. 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 DDD (degenerative disc disease), cervical s/p fusion    9. History of fusion of cervical spine   10. Abnormal MRI, cervical spine (02/13/2009)   11. Abnormal CT scan, cervical spine (06/17/2014)   12. Abnormal MRI, lumbar spine (02/05/2013)    13. History of lumbar surgery   14. Chronic lower extremity pain (Bilateral) (intermittent)   15. DDD (degenerative disc disease),  lumbar s/p fusion    16. DDD (degenerative disc disease), thoracic   17. Disease related peripheral neuropathy   18. Pharmacologic therapy   19. Disorder of skeletal system   20. Problems influencing health status   21. B12 deficiency   22. Elevated LFTs   23. Hyperuricemia   24. Type 2 diabetes mellitus with hyperlipidemia ([place])    Plan of Care (Initial workup plan)  Note: Alexander Wilcox was reminded that as per protocol, today's visit has been an evaluation only. We have not taken over the patient's controlled substance management.  Problem-specific plan: No problem-specific Assessment & Plan notes found for this encounter.   Lab Orders     Compliance Drug Analysis, Ur     Comp. Metabolic Panel (12)     Magnesium     Vitamin B12     Sedimentation rate     25-Hydroxy vitamin D Lcms D2+D3     C-reactive protein     Uric acid, random urine     Uric acid     Hemoglobin A1c  Imaging Orders     DG Lumbar Spine Complete W/Bend     DG Thoracic Spine 2 View     MR CERVICAL SPINE WO CONTRAST Referral Orders  No referral(s) requested today   Procedure Orders    No procedure(s) ordered today   Pharmacotherapy (current): Medications ordered:  No orders of the defined types were placed in this encounter.  Medications administered during this visit: Alexander Wilcox. Wilcox "Timmothy Sours" had no medications administered during this visit.   Pharmacological management options:  Opioid Analgesics: The patient was informed that there is no guarantee that he would be a candidate for opioid analgesics. The decision will be made following CDC guidelines. This decision will be based on the results of diagnostic studies, as well as Alexander Wilcox's risk profile.   Membrane stabilizer: To be determined at a later time  Muscle relaxant: To be determined at a later time  NSAID:  To be determined at a later time  Other analgesic(s): To be determined at a later time   Interventional management options: Mr. Alverio was informed that there is no guarantee that he would be a candidate for interventional therapies. The decision will be based on the results of diagnostic studies, as well as Mr. Letts's risk profile.  Procedure(s) under consideration:  Pending results of evaluation   Provider-requested follow-up: Return for (F2F), (40 min) 2V on evaluation day, (s/p Tests).  Future Appointments  Date Time Provider [place]  05/19/2021 10:30 AM Bo Merino, MD [facility] None    Note by: Gaspar Cola, MD Date: 11/10/2020; Time: 6:16 PM

## 2020-11-10 ENCOUNTER — Encounter: Payer: Self-pay | Admitting: Pain Medicine

## 2020-11-10 ENCOUNTER — Other Ambulatory Visit: Payer: Self-pay

## 2020-11-10 ENCOUNTER — Ambulatory Visit
Admission: RE | Admit: 2020-11-10 | Discharge: 2020-11-10 | Disposition: A | Discharge status: Home / Self Care | Payer: Medicare Other | Source: Ambulatory Visit | Attending: Pain Medicine | Admitting: Pain Medicine

## 2020-11-10 ENCOUNTER — Other Ambulatory Visit: Payer: Self-pay | Admitting: Pain Medicine

## 2020-11-10 ENCOUNTER — Ambulatory Visit: Payer: Medicare Other | Admitting: Pain Medicine

## 2020-11-10 VITALS — BP 138/78 | HR 87 | Temp 96.9°F | Resp 16 | Ht 68.0 in | Wt 243.0 lb

## 2020-11-10 DIAGNOSIS — M4722 Other spondylosis with radiculopathy, cervical region: Secondary | ICD-10-CM

## 2020-11-10 DIAGNOSIS — M5134 Other intervertebral disc degeneration, thoracic region: Secondary | ICD-10-CM | POA: Insufficient documentation

## 2020-11-10 DIAGNOSIS — E1169 Type 2 diabetes mellitus with other specified complication: Secondary | ICD-10-CM | POA: Insufficient documentation

## 2020-11-10 DIAGNOSIS — M5136 Other intervertebral disc degeneration, lumbar region: Secondary | ICD-10-CM | POA: Insufficient documentation

## 2020-11-10 DIAGNOSIS — M79601 Pain in right arm: Secondary | ICD-10-CM | POA: Insufficient documentation

## 2020-11-10 DIAGNOSIS — Z789 Other specified health status: Secondary | ICD-10-CM | POA: Insufficient documentation

## 2020-11-10 DIAGNOSIS — M4712 Other spondylosis with myelopathy, cervical region: Secondary | ICD-10-CM

## 2020-11-10 DIAGNOSIS — M899 Disorder of bone, unspecified: Secondary | ICD-10-CM

## 2020-11-10 DIAGNOSIS — M79604 Pain in right leg: Secondary | ICD-10-CM

## 2020-11-10 DIAGNOSIS — M47819 Spondylosis without myelopathy or radiculopathy, site unspecified: Secondary | ICD-10-CM

## 2020-11-10 DIAGNOSIS — R937 Abnormal findings on diagnostic imaging of other parts of musculoskeletal system: Secondary | ICD-10-CM | POA: Insufficient documentation

## 2020-11-10 DIAGNOSIS — M5412 Radiculopathy, cervical region: Secondary | ICD-10-CM

## 2020-11-10 DIAGNOSIS — M5030 Other cervical disc degeneration, unspecified cervical region: Secondary | ICD-10-CM

## 2020-11-10 DIAGNOSIS — M542 Cervicalgia: Secondary | ICD-10-CM

## 2020-11-10 DIAGNOSIS — M79605 Pain in left leg: Secondary | ICD-10-CM

## 2020-11-10 DIAGNOSIS — E785 Hyperlipidemia, unspecified: Secondary | ICD-10-CM

## 2020-11-10 DIAGNOSIS — M961 Postlaminectomy syndrome, not elsewhere classified: Secondary | ICD-10-CM | POA: Insufficient documentation

## 2020-11-10 DIAGNOSIS — G8929 Other chronic pain: Secondary | ICD-10-CM | POA: Insufficient documentation

## 2020-11-10 DIAGNOSIS — G6289 Other specified polyneuropathies: Secondary | ICD-10-CM

## 2020-11-10 DIAGNOSIS — E538 Deficiency of other specified B group vitamins: Secondary | ICD-10-CM

## 2020-11-10 DIAGNOSIS — M4810 Ankylosing hyperostosis [Forestier], site unspecified: Secondary | ICD-10-CM

## 2020-11-10 DIAGNOSIS — M79602 Pain in left arm: Secondary | ICD-10-CM | POA: Insufficient documentation

## 2020-11-10 DIAGNOSIS — E790 Hyperuricemia without signs of inflammatory arthritis and tophaceous disease: Secondary | ICD-10-CM

## 2020-11-10 DIAGNOSIS — Z79899 Other long term (current) drug therapy: Secondary | ICD-10-CM | POA: Insufficient documentation

## 2020-11-10 DIAGNOSIS — R7989 Other specified abnormal findings of blood chemistry: Secondary | ICD-10-CM | POA: Insufficient documentation

## 2020-11-10 DIAGNOSIS — M51369 Other intervertebral disc degeneration, lumbar region without mention of lumbar back pain or lower extremity pain: Secondary | ICD-10-CM

## 2020-11-10 DIAGNOSIS — G894 Chronic pain syndrome: Secondary | ICD-10-CM | POA: Insufficient documentation

## 2020-11-10 DIAGNOSIS — Z981 Arthrodesis status: Secondary | ICD-10-CM | POA: Insufficient documentation

## 2020-11-10 DIAGNOSIS — Z9889 Other specified postprocedural states: Secondary | ICD-10-CM

## 2020-11-10 NOTE — Progress Notes (Signed)
Safety precautions to be maintained throughout the outpatient stay will include: orient to surroundings, keep bed in low position, maintain call bell within reach at all times, provide assistance with transfer out of bed and ambulation.  

## 2020-11-10 NOTE — Patient Instructions (Signed)

## 2020-11-11 LAB — URIC ACID, RANDOM URINE: Uric Acid, Ur: 18.5 mg/dL

## 2020-11-19 ENCOUNTER — Other Ambulatory Visit: Payer: Medicare Other

## 2020-11-19 LAB — COMP. METABOLIC PANEL (12)
AST: 23 IU/L (ref 0–40)
Albumin/Globulin Ratio: 1.8 (ref 1.2–2.2)
Albumin: 4.7 g/dL (ref 3.8–4.8)
Alkaline Phosphatase: 106 IU/L (ref 44–121)
BUN/Creatinine Ratio: 22 (ref 10–24)
BUN: 25 mg/dL (ref 8–27)
Bilirubin Total: 0.5 mg/dL (ref 0.0–1.2)
Calcium: 10.2 mg/dL (ref 8.6–10.2)
Chloride: 98 mmol/L (ref 96–106)
Creatinine, Ser: 1.14 mg/dL (ref 0.76–1.27)
Globulin, Total: 2.6 g/dL (ref 1.5–4.5)
Glucose: 244 mg/dL — ABNORMAL HIGH (ref 65–99)
Potassium: 4.8 mmol/L (ref 3.5–5.2)
Sodium: 138 mmol/L (ref 134–144)
Total Protein: 7.3 g/dL (ref 6.0–8.5)
eGFR: 70 mL/min/{1.73_m2} (ref >59)

## 2020-11-19 LAB — MAGNESIUM: Magnesium: 2 mg/dL (ref 1.6–2.3)

## 2020-11-19 LAB — HEMOGLOBIN A1C
Est. average glucose Bld gHb Est-mCnc: 260 mg/dL
Hgb A1c MFr Bld: 10.7 % — ABNORMAL HIGH (ref 4.8–5.6)

## 2020-11-19 LAB — C-REACTIVE PROTEIN: CRP: 3 mg/L (ref 0–10)

## 2020-11-19 LAB — SEDIMENTATION RATE: Sed Rate: 2 mm/hr (ref 0–30)

## 2020-11-19 LAB — 25-HYDROXY VITAMIN D LCMS D2+D3
25-Hydroxy, Vitamin D-2: <1 ng/mL
25-Hydroxy, Vitamin D-3: 31 ng/mL
25-Hydroxy, Vitamin D: 31 ng/mL

## 2020-11-19 LAB — VITAMIN B12: Vitamin B-12: 1136 pg/mL (ref 232–1245)

## 2020-11-19 LAB — URIC ACID: Uric Acid: 3.6 mg/dL — ABNORMAL LOW (ref 3.8–8.4)

## 2020-11-20 LAB — COMPLIANCE DRUG ANALYSIS, UR

## 2020-12-08 ENCOUNTER — Other Ambulatory Visit: Payer: Self-pay | Admitting: Surgery

## 2020-12-08 DIAGNOSIS — M7502 Adhesive capsulitis of left shoulder: Secondary | ICD-10-CM

## 2020-12-10 ENCOUNTER — Ambulatory Visit
Admission: RE | Admit: 2020-12-10 | Discharge: 2020-12-10 | Disposition: A | Discharge status: Home / Self Care | Payer: Medicare Other | Source: Ambulatory Visit | Attending: Pain Medicine | Admitting: Pain Medicine

## 2020-12-10 ENCOUNTER — Other Ambulatory Visit: Payer: Self-pay

## 2020-12-10 DIAGNOSIS — M4722 Other spondylosis with radiculopathy, cervical region: Secondary | ICD-10-CM | POA: Diagnosis present

## 2020-12-10 DIAGNOSIS — M5030 Other cervical disc degeneration, unspecified cervical region: Secondary | ICD-10-CM

## 2020-12-10 DIAGNOSIS — M4810 Ankylosing hyperostosis [Forestier], site unspecified: Secondary | ICD-10-CM

## 2020-12-10 DIAGNOSIS — M7502 Adhesive capsulitis of left shoulder: Secondary | ICD-10-CM | POA: Insufficient documentation

## 2020-12-10 DIAGNOSIS — M542 Cervicalgia: Secondary | ICD-10-CM | POA: Diagnosis present

## 2020-12-10 DIAGNOSIS — M4712 Other spondylosis with myelopathy, cervical region: Secondary | ICD-10-CM | POA: Insufficient documentation

## 2020-12-10 DIAGNOSIS — R937 Abnormal findings on diagnostic imaging of other parts of musculoskeletal system: Secondary | ICD-10-CM | POA: Diagnosis not present

## 2020-12-10 DIAGNOSIS — Z981 Arthrodesis status: Secondary | ICD-10-CM

## 2021-01-19 ENCOUNTER — Telehealth: Payer: Self-pay | Admitting: Pain Medicine

## 2021-01-19 NOTE — Telephone Encounter (Signed)
Patient needs a 2nd visit follow up appointment to discuss labs and MRI. Please call him and make this appointment. Thank you.

## 2021-01-21 ENCOUNTER — Ambulatory Visit: Payer: Medicare Other | Attending: Pain Medicine | Admitting: Pain Medicine

## 2021-01-21 ENCOUNTER — Encounter: Payer: Self-pay | Admitting: Pain Medicine

## 2021-01-21 ENCOUNTER — Other Ambulatory Visit: Payer: Self-pay

## 2021-01-21 VITALS — BP 135/74 | HR 66 | Temp 96.9°F | Ht 68.0 in | Wt 255.0 lb

## 2021-01-21 DIAGNOSIS — M79604 Pain in right leg: Secondary | ICD-10-CM

## 2021-01-21 DIAGNOSIS — M47819 Spondylosis without myelopathy or radiculopathy, site unspecified: Secondary | ICD-10-CM | POA: Insufficient documentation

## 2021-01-21 DIAGNOSIS — M19072 Primary osteoarthritis, left ankle and foot: Secondary | ICD-10-CM

## 2021-01-21 DIAGNOSIS — M4810 Ankylosing hyperostosis [Forestier], site unspecified: Secondary | ICD-10-CM | POA: Diagnosis present

## 2021-01-21 DIAGNOSIS — G9589 Other specified diseases of spinal cord: Secondary | ICD-10-CM | POA: Diagnosis present

## 2021-01-21 DIAGNOSIS — E1149 Type 2 diabetes mellitus with other diabetic neurological complication: Secondary | ICD-10-CM | POA: Diagnosis present

## 2021-01-21 DIAGNOSIS — M79602 Pain in left arm: Secondary | ICD-10-CM | POA: Diagnosis present

## 2021-01-21 DIAGNOSIS — R937 Abnormal findings on diagnostic imaging of other parts of musculoskeletal system: Secondary | ICD-10-CM | POA: Insufficient documentation

## 2021-01-21 DIAGNOSIS — M79601 Pain in right arm: Secondary | ICD-10-CM | POA: Insufficient documentation

## 2021-01-21 DIAGNOSIS — M170 Bilateral primary osteoarthritis of knee: Secondary | ICD-10-CM | POA: Insufficient documentation

## 2021-01-21 DIAGNOSIS — M19041 Primary osteoarthritis, right hand: Secondary | ICD-10-CM | POA: Diagnosis present

## 2021-01-21 DIAGNOSIS — M19071 Primary osteoarthritis, right ankle and foot: Secondary | ICD-10-CM

## 2021-01-21 DIAGNOSIS — M961 Postlaminectomy syndrome, not elsewhere classified: Secondary | ICD-10-CM | POA: Insufficient documentation

## 2021-01-21 DIAGNOSIS — E1142 Type 2 diabetes mellitus with diabetic polyneuropathy: Secondary | ICD-10-CM | POA: Diagnosis not present

## 2021-01-21 DIAGNOSIS — G8929 Other chronic pain: Secondary | ICD-10-CM | POA: Diagnosis present

## 2021-01-21 DIAGNOSIS — M79605 Pain in left leg: Secondary | ICD-10-CM | POA: Diagnosis present

## 2021-01-21 DIAGNOSIS — M19042 Primary osteoarthritis, left hand: Secondary | ICD-10-CM | POA: Diagnosis present

## 2021-01-21 DIAGNOSIS — G894 Chronic pain syndrome: Secondary | ICD-10-CM

## 2021-01-21 DIAGNOSIS — R7309 Other abnormal glucose: Secondary | ICD-10-CM | POA: Insufficient documentation

## 2021-01-21 DIAGNOSIS — Z794 Long term (current) use of insulin: Secondary | ICD-10-CM | POA: Insufficient documentation

## 2021-01-21 NOTE — Progress Notes (Signed)
Safety precautions to be maintained throughout the outpatient stay will include: orient to surroundings, keep bed in low position, maintain call bell within reach at all times, provide assistance with transfer out of bed and ambulation.  

## 2021-01-21 NOTE — Progress Notes (Signed)
PROVIDER NOTE: Information contained herein reflects review and annotations entered in association with encounter. Interpretation of such information and data should be left to medically-trained personnel. Information provided to patient can be located elsewhere in the medical record under "Patient Instructions". Document created using STT-dictation technology, any transcriptional errors that may result from process are unintentional.    Patient: Alexander Wilcox  Service Category: E/M  Provider: Gaspar Cola, MD  DOB: 10/17/1950  DOS: 01/21/2021  Specialty: Interventional Pain Management  MRN: [id number]  Setting: Ambulatory outpatient  PCP: Dion Body, MD  Type: Established Patient    Referring Provider: Dion Body, MD  Location: Office  Delivery: Face-to-face     Primary Reason(s) for Visit: Encounter for evaluation before starting new chronic pain management plan of care (Level of risk: moderate) CC: Foot Pain  HPI  Alexander Wilcox is a 70 y.o. year old, male patient, who comes today for a follow-up evaluation to review the test results and decide on a treatment plan. 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 ; History of hypertension; History of prostate cancer; Plantar fasciitis; Elevated LFTs; DDD (degenerative disc disease), lumbar s/p fusion ; DDD (degenerative disc disease), thoracic; DDD (degenerative disc disease), cervical s/p fusion ; Osteoarthritis of hands (Bilateral); Osteoarthritis of knees (Bilateral); Osteoarthritis of feet (Bilateral); Rheumatoid factor positive; History of sleep apnea; Hyperuricemia; Chronic right SI joint pain; DISH (diffuse idiopathic skeletal hyperostosis); Essential hypertension; History of rheumatoid arthritis; Medicare annual wellness visit, initial; Mild concentric left ventricular hypertrophy (LVH); Mixed hyperlipidemia; Morbid obesity due to excess calories ([place]); OSA on CPAP; History of fusion of cervical spine; Type 2 diabetes  mellitus with hyperlipidemia ([place]); B12 deficiency; Disease related peripheral neuropathy; Gastroesophageal reflux disease without esophagitis; Chronic pain syndrome; Pharmacologic therapy; Disorder of skeletal system; Problems influencing health status; Abnormal MRI, cervical spine (12/10/2020); Abnormal CT scan, cervical spine (06/17/2014); Abnormal MRI, lumbar spine (02/05/2013); Failed back surgical syndrome; History of lumbar surgery; Cervical spondylosis with myelopathy and radiculopathy; Cervical radiculitis (intermittent); Chronic upper extremity pain (Bilateral) (intermittent); Chronic lower extremity pain (Bilateral) (intermittent); Diabetic peripheral neuropathy ([place]); Elevated hemoglobin A1c; DM (diabetes mellitus), type 2 with neurological complications (HCC); and Cervical cord myelomalacia (HCC) on their problem list. His primarily concern today is the Foot Pain  Pain Assessment: Location: Right, Left Foot Radiating: pain radiaties eveywhere Onset: More than a month ago Duration: Chronic pain Quality: Shooting, Aching, Tingling Severity: 2 /10 (subjective, self-reported pain score)  Effect on ADL: limits my daily activities Timing: Intermittent Modifying factors: sitting in chair, laying down BP: 135/74  HR: 66  Alexander Wilcox comes in today for a follow-up visit after his initial evaluation on 01/19/2021. Today we went over the results of his tests. These were explained in "[place]man's terms". During today's appointment we went over my diagnostic impression, as well as the proposed treatment plan.  Review of initial evaluation: "According to the patient and he was sent over to [place] to evaluate and treat his upper and lower extremity peripheral neuropathy.  According to the patient he has already tried gabapentin for approximately a month where he was taking the gabapentin 300 mg 3 tablets p.o. 3 times daily.  He also describes having tried Lyrica for 1 month TID, but neither one of them provide  him with any relief of the pain.  He denies having had a nerve conduction test and he describes the pain as an electrical-like sensation that he has had for the past 18 months on an intermittent basis.  He refers that it started with tingling in both of his feet then it moved to his calf and then to the hands.  He refers having had some experience with the use of he states that his diabetes started with Enbrel, when he was being treated for his prostate cancer.  He was recently told that they recommended that he go back on it but he states that he thinks the cancer came from the medication and he has already gone through rounds with this prostate cancer and he is not interested in trying that again.  He refers having had cervical spine surgery approximately 14 years ago by Dr. Glenna Fellows and having also been treated at a [facility] pain facility where he had an injection in the cervical spine that did help.  He recently was given some prednisone for several fire ant bites that triggered anaphylactic reaction.  Since then his blood sugar has been out of control.  He also refers having to deal with left shoulder pain secondary to a fracture and he mentioned that when he had that neck surgery, he woke up paralyzed and it took a while to recover from that.  He has had radiation for the prostate cancer and he thinks that his diabetes might of come from that.  Interestingly, when he was recently given the prednisone he stated that it did not help with the neuropathy.  He had some oxycodone given to him for the shoulder pain and this seems to have helped some of the neuropathy pain.  The patient's description of the pain and the pain pattern makes me think that this may be coming from the cervical region where he may be having a cervical spondylosis with myelopathy and radiculopathy."  Today we had a very long talk with her we went over all of the labs and the x-rays.  His primary concern is that of his feet and hands  where he will intermittently have shooting pains into his feet which he describes as then moving all the way into his arms and after that he describes having a "hot flash".  I have explained to him that this is not a typical type of pain or pattern that we see often.  I also explained to him that the MRI of the cervical spine does show that at the C2-3 level he has myelomalacia, which could affect anything from that level down.  He has never had any nerve conduction test and therefore I will be ordering a nerve conduction test of the upper and lower extremities in an effort to provide more definition as to the type of nerve involvement that he is having.  He is clearly having neuropathic pain, but at this point is not clear if this is centrally mediated or peripherally mediated.  He does have uncontrolled diabetes mellitus with an elevated hemoglobin A1c.  He says that he has been seeing an endocrinologist that has been trying to control this with medications.  Nerve blocks using steroids tend to cause his diabetes to further get out of control.  Controlled Substance Pharmacotherapy Assessment REMS (Risk Evaluation and Mitigation Strategy)  Analgesic: Oxycodone IR 10 mg, 1 tab p.o. 3 times daily (08/29/2020) MME/day: 45 mg/day  Pill Count: None expected due to no prior prescriptions written by our practice. Chauncey Fischer, RN  01/21/2021 10:11 AM  Sign when Signing Visit Safety precautions to be maintained throughout the outpatient stay will include: orient to surroundings, keep bed in low position, maintain call bell  within reach at all times, provide assistance with transfer out of bed and ambulation.     Pharmacokinetics: Liberation and absorption (onset of action): WNL Distribution (time to peak effect): WNL Metabolism and excretion (duration of action): WNL         Pharmacodynamics: Desired effects: Analgesia: Mr. Jiminez reports >50% benefit. Functional ability: Patient reports that medication  allows him to accomplish basic ADLs Clinically meaningful improvement in function (CMIF): Sustained CMIF goals met Perceived effectiveness: Described as relatively effective, allowing for increase in activities of daily living (ADL) Undesirable effects: Side-effects or Adverse reactions: None reported Monitoring: [place] PMP: PDMP not reviewed this encounter. Online review of the past 5-month period previously conducted. Not applicable at this point since we have not taken over the patient's medication management yet. List of other Serum/Urine Drug Screening Test(s):  No results found for: AMPHSCRSER, BARBSCRSER, BENZOSCRSER, COCAINSCRSER, COCAINSCRNUR, [place], THCSCRSER, [place], CANNABQUANT, [place], [place], [place], [place] List of all UDS test(s) done:  Lab Results  Component Value Date   SUMMARY Note 11/10/2020   Last UDS on record: Summary  Date Value Ref Range Status  11/10/2020 Note  Final    Comment:    ==================================================================== Compliance Drug Analysis, Ur ==================================================================== Test                             Result       Flag       Units  Drug Present and Declared for Prescription Verification   Duloxetine                     PRESENT      EXPECTED   Metoprolol                     PRESENT      EXPECTED  Drug Present not Declared for Prescription Verification   Cyclobenzaprine                PRESENT      UNEXPECTED   Desmethylcyclobenzaprine       PRESENT      UNEXPECTED    Desmethylcyclobenzaprine is an expected metabolite of    cyclobenzaprine.  Drug Absent but Declared for Prescription Verification   Hydrocodone                    Not Detected UNEXPECTED ng/mg creat   Gabapentin                     Not Detected UNEXPECTED   Acetaminophen                  Not Detected UNEXPECTED    Acetaminophen, as indicated in the declared medication list, is not    always detected even  when used as directed.    Salicylate                     Not Detected UNEXPECTED    Aspirin, as indicated in the declared medication list, is not always    detected even when used as directed.  ==================================================================== Test                      Result    Flag   Units      Ref Range   Creatinine              74  mg/dL      >=20 ==================================================================== Declared Medications:  The flagging and interpretation on this report are based on the  following declared medications.  Unexpected results may arise from  inaccuracies in the declared medications.   **Note: The testing scope of this panel includes these medications:   Duloxetine  Gabapentin  Hydrocodone  Metoprolol   **Note: The testing scope of this panel does not include small to  moderate amounts of these reported medications:   Acetaminophen  Aspirin   **Note: The testing scope of this panel does not include the  following reported medications:   Apalutamide  Cyanocobalamin  Fenofibrate  Glipizide  Hydrochlorothiazide (Accuretic)  Leuprolide  Melatonin  Metformin  Omeprazole  Quinapril (Accuretic)  Solifenacin  Tamsulosin ==================================================================== For clinical consultation, please call [phone]. ====================================================================    UDS interpretation: No unexpected findings.          Medication Assessment Form: Not applicable. Treatment compliance: Not applicable Risk Assessment Profile: Aberrant behavior: See initial evaluations. None observed or detected today Comorbid factors increasing risk of overdose: See initial evaluation. No additional risks detected today Opioid risk tool (ORT):  Opioid Risk  11/10/2020  Alcohol 0  Illegal Drugs 0  Rx Drugs 0  Alcohol 0  Illegal Drugs 0  Rx Drugs 0  Age between 16-45 years  0   Psychological Disease 0  Depression 0  Opioid Risk Tool Scoring 0  Opioid Risk Interpretation Low Risk    ORT Scoring interpretation table:  Score <3 = Low Risk for SUD  Score between 4-7 = Moderate Risk for SUD  Score >8 = High Risk for Opioid Abuse   Risk of substance use disorder (SUD): Low  Risk Mitigation Strategies:  Patient opioid safety counseling: Not applicable. Patient-Prescriber Agreement (PPA): No agreement signed.  Controlled substance notification to other providers: Not applicable  Pharmacologic Plan: No opioid analgesic prescribed.             Laboratory Chemistry Profile   Renal Lab Results  Component Value Date   BUN 25 11/10/2020   CREATININE 1.14 11/10/2020   BCR 22 11/10/2020   GFRAA 76 12/14/2017   GFRNONAA 66 12/14/2017   PROTEINUR NEGATIVE 06/10/2008     Electrolytes Lab Results  Component Value Date   NA 138 11/10/2020   K 4.8 11/10/2020   CL 98 11/10/2020   CALCIUM 10.2 11/10/2020   MG 2.0 11/10/2020     Hepatic Lab Results  Component Value Date   AST 23 11/10/2020   ALT 75 (H) 12/14/2017   ALBUMIN 4.7 11/10/2020   ALKPHOS 106 11/10/2020     ID No results found for: LYMEIGGIGMAB, HIV, SARSCOV2NAA, STAPHAUREUS, MRSAPCR, HCVAB, PREGTESTUR, RMSFIGG, QFVRPH1IGG, QFVRPH2IGG, [facility]   Bone Lab Results  Component Value Date   25OHVITD1 31 11/10/2020   25OHVITD2 <1.0 11/10/2020   25OHVITD3 31 11/10/2020     Endocrine Lab Results  Component Value Date   GLUCOSE 244 (H) 11/10/2020   GLUCOSEU NEGATIVE 06/10/2008   HGBA1C 10.7 (H) 11/10/2020     Neuropathy Lab Results  Component Value Date   VITAMINB12 1,136 11/10/2020   HGBA1C 10.7 (H) 11/10/2020     CNS No results found for: COLORCSF, APPEARCSF, RBCCOUNTCSF, WBCCSF, POLYSCSF, LYMPHSCSF, EOSCSF, PROTEINCSF, GLUCCSF, JCVIRUS, CSFOLI, IGGCSF, LABACHR, ACETBL, LABACHR, ACETBL   Inflammation (CRP: Acute  ESR: Chronic) Lab Results  Component Value Date   CRP 3  11/10/2020   ESRSEDRATE 2 11/10/2020     Rheumatology Lab Results  Component Value  Date   LABURIC 3.6 (L) 11/10/2020   URICUR 18.5 11/10/2020     Coagulation Lab Results  Component Value Date   INR 1.0 06/10/2008   LABPROT 13.1 06/10/2008   APTT 28 06/10/2008   PLT 148 12/14/2017     Cardiovascular Lab Results  Component Value Date   HGB 15.5 12/14/2017   HCT 43.7 12/14/2017     Screening No results found for: SARSCOV2NAA, COVIDSOURCE, STAPHAUREUS, MRSAPCR, HCVAB, HIV, PREGTESTUR   Cancer No results found for: CEA, CA125, LABCA2   Allergens No results found for: ALMOND, APPLE, ASPARAGUS, AVOCADO, BANANA, BARLEY, BASIL, BAYLEAF, GREENBEAN, LIMABEAN, WHITEBEAN, BEEFIGE, REDBEET, BLUEBERRY, BROCCOLI, CABBAGE, MELON, CARROT, CASEIN, CASHEWNUT, CAULIFLOWER, CELERY     Note: Lab results reviewed.  Recent Diagnostic Imaging Review  Cervical Imaging: Cervical MR wo contrast: Results for orders placed during the hospital encounter of 12/10/20 MR CERVICAL SPINE WO CONTRAST  Narrative CLINICAL DATA:  Neck pain, chronic.  History of cervical fusion.  EXAM: MRI CERVICAL SPINE WITHOUT CONTRAST  TECHNIQUE: Multiplanar, multisequence MR imaging of the cervical spine was performed. No intravenous contrast was administered.  COMPARISON:  MRI 02/13/2009. cervical radiographs Nov 10, 2020.  FINDINGS: Alignment: Slight reversal of the normal cervical lordosis, similar to prior. No substantial sagittal subluxation.  Vertebrae: Vertebral body heights are maintained. No specific evidence of acute fracture, discitis/osteomyelitis, or suspicious bone lesion.  Cord: Similar discrete/focal area of T2 hyperintensity within the cord at C3-C4, compatible with myelomalacia.  Posterior Fossa, vertebral arteries, paraspinal tissues: Visualized vertebral artery flow voids are maintained. Small/non dominant right vertebral artery, similar to prior. The visualized inferior posterior fossa  is unremarkable on limited sagittal assessment.  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 No significant foraminal stenosis.  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 Progression of bilateral facet and uncovertebral hypertrophy with moderate right and mild left foraminal stenosis.  C4-C5: Similar versus mildly progressed bilateral facet hypertrophy without significant canal stenosis. Similar mild bilateral foraminal stenosis.  C5-C6: Mild posterior osteophytic ridging and bilateral uncovertebral spurring. Similar moderate right and mild left foraminal stenosis. No significant canal stenosis.  C6-C7: Similar mild endplate and uncovertebral spurring without significant canal or foraminal stenosis.  C7-T1: No significant disc protrusion, foraminal stenosis, or canal stenosis.  IMPRESSION: 1. 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 Similar cord deformity at C2-C3 with chronic myelomalacia. 2. Progressive facet/uncovertebral hypertrophy at C3-C4 with moderate right and mild left foraminal stenosis. Similar moderate right and mild left foraminal stenosis C5-C6 and mild bilateral foraminal stenosis at C4-C5.   Electronically Signed By: Margaretha Sheffield MD On: 12/10/2020 15:57  Cervical MR w/wo contrast: Results for orders placed during the hospital encounter of 02/13/09 MR Cervical Spine W Wo Contrast  Narrative Clinical Data: Pain and stiffness in the neck and back.  Cervical spondylosis.  Prior cervical surgery in January 2008.  Prostate cancer.  MRI CERVICAL SPINE WITHOUT AND WITH CONTRAST  Technique:  Multiplanar and multiecho pulse sequences of the cervical spine, to include the  craniocervical junction and cervicothoracic junction, were obtained according to standard protocol without and with intravenous contrast.  Contrast: 20 ml Multihance  Comparison: 08/31/2007  Findings: Again noted is a prior operative intervention at C3-4 with susceptibility artifact associated with posterior facet screws.  Ossification of the  posterior longitudinal ligament extends from C2-C4, and there continues to be a 5 mm by 3 mm T2 hyperintense focus centrally in the cervical cord at C3-4 which does not demonstrably enhance on axial postcontrast images and is most compatible with myelomalacia or prior infarct.  The craniocervical junction appears unremarkable.  There is straightening of the normal cervical lordosis.  No significant abnormal vertebral body edema is identified.  No specific findings of osseous metastatic disease to the cervical spine noted.  At the extreme inferior margin of the sagittal images, there is a suggestion of in the anterior extradural structure causing central stenosis at the T2-3 level.  This appears similar in magnitude to the prior exam.  Additional findings at individual levels are as follows:  C2-3:  Ossification of posterior longitudinal ligament noted. The AP diameter of the thecal sac is 9 mm, compatible with mild central stenosis. The appearance is not significantly changed.  C3-4:  Ossification of the posterior longitudinal ligament noted. The AP diameter of the thecal sac is 8 mm compatible with moderate central stenosis. Uncinate spurring causes borderline right foraminal stenosis.  C4-5:  Mild facet arthropathy noted without overt stenosis.  C5-6:  Broad chronic disc-osteophyte complex noted with uncinate spurring contributing to moderate right and mild left neural foraminal stenosis, unchanged.  C6-7:  Mild right eccentric disc bulge, without stenosis.  C7-T1:  Unremarkable.  IMPRESSION:  1.  Unchanged appearance of the  cervical spine, with mild to moderate multifactorial central and foraminal stenoses but without evidence of significant progression or metastatic disease to the cervical spine. 2.  Unchanged appearance of central myelomalacia at the C3-4 level.  Provider: Weyman Rodney  Cervical CT wo contrast: Results for orders placed during the hospital encounter of 06/17/14 CT Cervical Spine Wo Contrast  Narrative CLINICAL DATA:  Cervical spondylosis. Progressive pain over the last 6 months, left greater than right.  EXAM: CT CERVICAL SPINE WITHOUT CONTRAST  TECHNIQUE: Multidetector CT imaging of the cervical spine was performed without intravenous contrast. Multiplanar CT image reconstructions were also generated.  COMPARISON:  MRI of the cervical spine 02/13/2009.  FINDINGS: The cervical spine is imaged from the skullbase through T1-2. Ossification of posterior longitudinal ligament is present from the tip of the basal E on the Bruce C4-5. Fusion of anterior osteophytes is present from the C4-5 through C7-T1. There is fusion of the atlantoaxial joints bilaterally. Osteophyte formation and degenerative changes are noted at C1-2 lateral mass articulations bilaterally.  Posterior surgical fusion is noted C2-3 and C3-4 with lateral mass screws at C3-4 bilaterally. The posterior elements are fused at C5-6 scratch the the posterior elements are fused at C4-5 and C5-6 is well.  C1-2:  The foramina are patent.  C2-3: Central canal stenosis is secondary to the ossification of posterior longitudinal ligament. The foramina patent.  C3-4: Mild uncovertebral spurring and bilateral foraminal stenosis is evident.  C4-5:  Mild foraminal narrowing is worse on the left.  C5-6: Mild foraminal narrowing is worse on the left. The patient is status post left laminectomy.  C6-7: Mild foraminal narrowing is worse on the right due to uncovertebral spurring.  C7-T1:  Mild left foraminal stenosis is  present.  IMPRESSION: 1. Ossification of the posterior longitudinal ligament from the tip of the base she on through C4-5. 2. With fusion of the atlantoaxial joint. 3. Degenerative lateral mass reticulation at C1-2. 4. Fusion of anterior osteophytes the C4-5 through C7-T1. 5. Surgical posterior fusion at C3-4 with additional posterior element fusion evident at  C2-3, C4-5, and C5-6. 6. Multilevel foraminal narrowing as described due to uncovertebral and facet spurring.   Electronically Signed By: Lawrence Santiago M.D. On: 06/17/2014 16:15  Shoulder Imaging: Shoulder-L MR wo contrast: Results for orders placed during the hospital encounter of 12/10/20 MR SHOULDER LEFT WO CONTRAST  Narrative CLINICAL DATA:  Left shoulder pain and limited range of motion. Prior humeral fracture January 2022. History of prostate cancer with biochemical recurrence.  EXAM: MRI OF THE LEFT SHOULDER WITHOUT CONTRAST  TECHNIQUE: Multiplanar, multisequence MR imaging of the shoulder was performed. No intravenous contrast was administered.  COMPARISON:  None.  FINDINGS: Rotator cuff: Mild supraspinatus and infraspinatus tendinopathy. No rotator cuff tear identified.  Muscles: Subtle accentuated T2 signal along the scapular margin of the subscapularis muscle on image 1 series 8 and image 11 series 5.  Biceps long head: Moderate tendinopathy of the intra-articular segment of long head.  Acromioclavicular Joint: Moderate spurring with a small amount of fluid signal in the joint. The spurring mildly indents the upper margin of the supraspinatus muscle. Type II acromion. Trace subacromial subdeltoid bursitis.  Glenohumeral Joint: Reasonably preserved articular cartilage. The surgical neck component of the prior fracture is healed with a substantial divot in the cortex between the humeral head and the metaphysis on image 15 of series 6 which could potentially catch on the inferior glenoid during  range of motion. The metaphysis has healed in about 8 mm of medial displacement with respect to the humeral head. There is low signal intensity within this divot and within the axillary pouch in this region suggesting local synovitis as shown on images 12 through 18 of series 6. I not see substantial synovitis in the rotator interval and may be that the synovitis along the axillary pouch is due to irritation during motion.  Labrum:  No well-defined labral tear identified.  Bones: In addition to the previously described a healed surgical neck fracture, there is a fracture plane versus marginal band from avascular necrosis extending along the anatomic neck and partially into the head of the humerus as shown on images 13 through 19 of series 7. 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 Some of the margins of this process are also serpentine as can often be seen in setting of avascular necrosis, but it may be AVN secondary to an original fracture plane.  There also appears to of originally been a fracture plane through the base of the greater tuberosity which has mostly healed.  Other: No supplemental non-categorized findings.  IMPRESSION: 1. Healed proximal humeral fracture with original surgical neck component in which the metaphyseal portion has healed with about 8 mm medial displacement with respect to the humeral head, resulting in a somewhat deep divot which may be catching along the inferior glenoid. There is substantial synovitis in this area and in the adjacent axillary pouch as shown for example on image 15 series 6. 2. 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 3. A component of the original fracture probably extended through the base of  the greater tuberosity; this fracture has healed with only slight residual visualization. 4. Mild supraspinatus and infraspinatus tendinopathy. 5. Subtle T2 signal along the scapular margin of the subscapularis muscle. I not see a definite associated scapular fracture. Mild to moderate degenerative AC joint  arthropathy. 6. Trace subacromial subdeltoid bursitis.   Electronically Signed By: Van Clines M.D. On: 12/10/2020 16:13  Lumbosacral Imaging: Lumbar MR wo contrast: Results for orders placed during the hospital encounter of 06/16/11 MR Lumbar Spine Wo Contrast  Narrative *RADIOLOGY REPORT*  Clinical Data: 70 year old male with low back, left hip and lower extremity pain.  Weakness.  Prior surgery.  MRI LUMBAR SPINE WITHOUT CONTRAST  Technique:  Multiplanar and multiecho pulse sequences of the lumbar spine were obtained without intravenous contrast.  Comparison: Intraoperative radiographs 09/03/2005.  Findings: Large body habitus.  Wrap artifact on axial images. Small T2 hyperintense cortical lesion of the right renal mid pole, favor benign cyst.  Otherwise negative visualized abdominal viscera.  Conus medullaris partially visible at T11-T12, grossly normal.  Postoperative change to the posterior paraspinal soft tissues at L5- L1 with denervation changes.  See additional details of that level below.  Mild degenerative endplate marrow edema at L2-L3. Incidental benign L2 vertebral body hemangiomas.  T11-T12:  Negative.  T12-L1:  Negative.  L1-L2:  Facet hypertrophy worse on the left.  A space-occupying lesion in the left posterior epidural space (series 6 and series 4 image 6). This is nonspecific but might reflect an oblong synovial cyst.  Subsequent mass effect on the posterior thecal sac with borderline to mild spinal stenosis.  Mild left L1 foraminal stenosis.  L2-L3:  Negative disc.  Facet hypertrophy.  Borderline to mild spinal and bilateral L2  foraminal stenosis.  L3-L4:  Disc desiccation and mild bulge.  Facet and ligament flavum hypertrophy.  Mild epidural lipomatosis.  Moderate spinal and mild bilateral L3 foraminal stenosis.  L4-L5:  Disc desiccation and circumferential disc bulge eccentric to the left.  Severe facet hypertrophy.  Mild to moderate spinal stenosis.  Moderate bilateral lateral recess stenosis.  Moderate left and mild right L4 foraminal stenosis.  L5-S1:  Sequelae of partial laminectomy.  Height T1 and T2 signal within the disc which is bulging eccentric to the right.  There is persistent mass effect on the right lateral recess displacing the right S1 nerve root (series 2 image 5).  Additional distortion at the right lateral recess may be related to granulation tissue. Residual posterior element hypertrophy.  Mild left L5 foraminal stenosis.  No significant spinal stenosis.  Sacral spinal canal epidural lipomatosis.  IMPRESSION:  1.  Postoperative changes at L5-S1 more so on the right.  The descending right S1 nerve root is deviated at the right lateral recess, and the appearance is suspicious for a disc protrusion, although some granulation tissue is suspected to contribute to the appearance. 2.  Multifactorial mild to moderate spinal, lateral recess, and bilateral foraminal stenosis at L3-L4 and L4-L5. 3.  Low signal space-occupying lesion in the left posterior epidural space at L1-L2 is nonspecific, but I favor an oblong synovial cyst. Another possibility is trace epidural hematoma of degenerative etiology.  Subsequent mass effect on the posterior thecal sac (series 4 image 6).  Original Report Authenticated By: Randall An, M.D.  Lumbar MR w/wo contrast: Results for orders placed during the hospital encounter of 02/05/13 MR Lumbar Spine W Wo Contrast  Addendum 02/06/2013  9:01 AM **ADDENDUM** CREATED: 02/06/2013 08:59:15  Two-view small hemangiomata in the L2 vertebral body are  unchanged. There is disc degeneration with bone marrow edema and enhancement at L2-3, slightly progressive from the prior study.  **END ADDENDUM** SIGNED BY: Monia Sabal. Carlis Abbott, M.D.  Narrative *RADIOLOGY REPORT*  Clinical Data: Lumbar spondylosis.  Back surgery.  Prostate cancer  BUN and creatinine were obtained on site at [place] at [address] Results:  BUN 19 mg/dL,  Creatinine 1.0 mg/dL.  MRI LUMBAR SPINE WITHOUT AND WITH CONTRAST  Technique:  Multiplanar and multiecho pulse sequences of the lumbar spine were obtained without and with intravenous contrast.  Contrast: 39mL MULTIHANCE GADOBENATE DIMEGLUMINE 529 MG/ML IV SOLN  Comparison: Lumbar MRI 06/16/2011  Findings: Prior laminectomy on the right L5-S1.  The lumbar alignment is normal.  Negative for fracture.  Conus medullaris is normal and terminates at T12-L1.  T12-L1:  Mild facet degeneration.  Negative for spinal stenosis  L1-2:  Mild facet hypertrophy bilaterally.  Ovoid soft tissue mass between the dura and facet joint on the left is unchanged from prior study.  This has intermediate signal on T1 and T2 and does not enhance.  There is mild flattening of the posterior dura without significant spinal stenosis.  This may represent a synovial cyst/focal ligament thickening.  L2-3:  Normal disc.  Moderate facet hypertrophy with mild spinal stenosis  L3-4:  Mild disc bulging.  Moderate facet hypertrophy bilaterally with moderately severe spinal stenosis.  There is foraminal encroachment bilaterally.  L4-5:  Disc degeneration with disc bulging.  Annular tear and a small lateral disc protrusion on the left with associated osteophyte.  Disc protrusion has progressed since the prior study. There is mild to moderate spinal stenosis which is unchanged. There is bilateral facet hypertrophy  L5-S1:  Postop laminectomy on the right.  There is a moderately large right-sided osteophyte which is  unchanged.  This is causing displacement of the right S1 nerve root.  There appears to be adequate decompression of the foramen.  Overall no change in this level from the prior study.  IMPRESSION: Ovoid soft tissue nonenhancing structure between the dura and facet joint on the left at L1-2.  This may represent a synovial cyst and is not causing significant neural impingement.  Moderate spinal stenosis L3-4.  Mild stenosis at L4-5.  Left foraminal and left lateral disc protrusion has progressed in the interval.  Postop laminectomy on the right at L5-S1.  There is prominent spurring and displacement of the right S1 nerve root, unchanged from the  prior study.  No recurrent disc protrusion.   Original Report Authenticated By: Carl Best, M.D.  Lumbar DG Bending views: Results for orders placed during the hospital encounter of 11/10/20 DG Lumbar Spine Complete W/Bend  Narrative CLINICAL DATA:  Chronic low back pain  EXAM: LUMBAR SPINE - COMPLETE WITH BENDING VIEWS  COMPARISON:  02/05/2013  FINDINGS: Frontal, bilateral oblique, lateral views of the lumbar spine are obtained. Flexion and extension lateral views are also performed. There are 5 non-rib-bearing lumbar type vertebral bodies in grossly normal alignment. No instability with flexion or extension. There is extensive multilevel facet hypertrophy greatest at L4-5 and L5-S1. Mild disc space narrowing at L5-S1. Bridging anterior osteophytes throughout the visualized thoracolumbar vertebral bodies. No acute fractures. Sacroiliac joints are normal.  IMPRESSION: 1. Extensive multilevel spondylosis and facet hypertrophy. No acute bony abnormality.   Electronically Signed By: Randa Ngo M.D. On: 11/11/2020 17:09  Complexity Note: Imaging results reviewed. Results shared with Mr. Rowles, using Layman's terms.                        Meds   Current Outpatient Medications:    aspirin EC 81 MG tablet, Take 81 mg by  mouth daily., Disp: , Rfl:    cyanocobalamin 1000  MCG tablet, Take 1,000 mcg by mouth daily., Disp: , Rfl:    dapagliflozin propanediol (FARXIGA) 10 MG TABS tablet, Take by mouth daily., Disp: , Rfl:    DULoxetine (CYMBALTA) 30 MG capsule, Take 1 capsule (30 mg total) by mouth daily., Disp: 90 capsule, Rfl: 0   ERLEADA 60 MG tablet, Take 240 mg by mouth daily., Disp: , Rfl:    fenofibrate 160 MG tablet, TAKE 1 TABLET DAILY, Disp: , Rfl:    glipiZIDE (GLUCOTROL) 10 MG tablet, Take 20 mg by mouth every morning., Disp: , Rfl:    Lancets 30G MISC, 1 each., Disp: , Rfl:    LEUPROLIDE ACETATE, 6 MONTH, 45 MG injection, Inject 45 mg into the skin every 6 (six) months., Disp: , Rfl:    metFORMIN (GLUCOPHAGE-XR) 500 MG 24 hr tablet, Take 1 tablet by mouth 2 (two) times daily. Pt taking one in the morning and one at night, Disp: , Rfl:    metoprolol succinate (TOPROL-XL) 25 MG 24 hr tablet, Take 25 mg by mouth 2 (two) times daily., Disp: , Rfl:    omeprazole (PRILOSEC) 40 MG capsule, Take 40 mg by mouth daily., Disp: , Rfl:    quinapril-hydrochlorothiazide (ACCURETIC) 10-12.5 MG tablet, Take 1 tablet by mouth daily. , Disp: , Rfl:    solifenacin (VESICARE) 10 MG tablet, Take 10 mg by mouth daily., Disp: , Rfl:    tamsulosin (FLOMAX) 0.4 MG CAPS capsule, Take 0.4 mg by mouth daily., Disp: , Rfl:   ROS  Constitutional: Denies any fever or chills Gastrointestinal: No reported hemesis, hematochezia, vomiting, or acute GI distress Musculoskeletal: Denies any acute onset joint swelling, redness, loss of ROM, or weakness Neurological: No reported episodes of acute onset apraxia, aphasia, dysarthria, agnosia, amnesia, paralysis, loss of coordination, or loss of consciousness  Allergies  Mr. Cartier is allergic to dilaudid [hydromorphone hcl].  [place]  Drug: Mr. Penson  reports previous drug use. Alcohol:  reports current alcohol use. Tobacco:  reports that he quit smoking about 24 years ago. His smoking use  included cigarettes. He has a 60.00 pack-year smoking history. He has never used smokeless tobacco. Medical:  has a past medical history of Arthritis, Diabetes mellitus without complication ([place]), Hypertension, Prostate cancer ([place]), and Sleep apnea. Surgical: Mr. Kilker  has a past surgical history that includes Appendectomy; Back surgery; Prostate surgery; and Knee Arthroplasty. Family: family history includes Diabetes in his maternal grandmother and sister; Kidney failure in his sister; Lung disease in his brother.  Constitutional Exam  General appearance: Well nourished, well developed, and well hydrated. In no apparent acute distress Vitals:   01/21/21 1011  BP: 135/74  Pulse: 66  Temp: (!) 96.9 F (36.1 C)  SpO2: 94%  Weight: 255 lb (115.7 kg)  Height: $Remove'5\' 8"'RTKXaHs$  (1.727 m)   BMI Assessment: Estimated body mass index is 38.77 kg/m as calculated from the following:   Height as of this encounter: $RemoveBeforeD'5\' 8"'[id number]$  (1.727 m).   Weight as of this encounter: 255 lb (115.7 kg).  BMI interpretation table: BMI level Category Range association with higher incidence of chronic pain  <18 kg/m2 Underweight   18.5-24.9 kg/m2 Ideal body weight   25-29.9 kg/m2 Overweight Increased incidence by 20%  30-34.9 kg/m2 Obese (Class I) Increased incidence by 68%  35-39.9 kg/m2 Severe obesity (Class II) Increased incidence by 136%  >40 kg/m2 Extreme obesity (Class III) Increased incidence by 254%   Patient's current BMI Ideal Body weight  Body mass index is 38.77 kg/m. Ideal  body weight: 68.4 kg (150 lb 12.7 oz) Adjusted ideal body weight: 87.3 kg (192 lb 7.6 oz)   BMI Readings from Last 4 Encounters:  01/21/21 38.77 kg/m  11/10/20 36.95 kg/m  05/19/20 39.13 kg/m  11/22/19 41.33 kg/m   Wt Readings from Last 4 Encounters:  01/21/21 255 lb (115.7 kg)  11/10/20 243 lb (110.2 kg)  05/19/20 265 lb (120.2 kg)  11/22/19 271 lb 12.8 oz (123.3 kg)    Psych/Mental status: Alert, oriented x 3 (person, place,  & time)       Eyes: PERLA Respiratory: No evidence of acute respiratory distress  Assessment & Plan  Primary Diagnosis & Pertinent Problem List: The primary encounter diagnosis was Chronic pain syndrome. 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  Visit Diagnosis: 1. Chronic pain syndrome   2. Chronic lower extremity pain (Bilateral) (intermittent)   3. Chronic upper extremity pain (Bilateral) (intermittent)   4. Diabetic peripheral neuropathy ([place])   5. DISH (diffuse idiopathic skeletal hyperostosis)   6. 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 Failed back surgical syndrome   8. Osteoarthritis of feet (Bilateral)   9. Osteoarthritis of hands (Bilateral)   10. Osteoarthritis of knees (Bilateral)   11. DM (diabetes mellitus), type 2 with neurological complications ([place])   12. Cervical cord myelomalacia (HCC)   13. Abnormal MRI, cervical spine (02/13/2009)   14. Elevated hemoglobin A1c    Problems updated and reviewed during this visit: Problem  Diabetic Peripheral Neuropathy (Hcc)  Cervical Cord Myelomalacia (Hcc)  Abnormal MRI, cervical spine (12/10/2020)   FINDINGS: Alignment: Slight reversal of the normal cervical lordosis, similar to prior. No substantial sagittal subluxation.   Vertebrae: Vertebral body heights are maintained. No specific evidence of acute fracture, discitis/osteomyelitis, or suspicious bone  lesion.   Cord: Similar discrete/focal area of T2 hyperintensity within the cord at C3-C4, compatible with myelomalacia.   Posterior Fossa, vertebral arteries, paraspinal tissues: Visualized vertebral artery flow voids are maintained. Small/non dominant right vertebral artery, similar to prior. The visualized inferior posterior fossa is unremarkable on limited sagittal assessment.   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 No significant foraminal stenosis.   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 Progression of bilateral facet and uncovertebral hypertrophy with moderate right and mild left foraminal stenosis.   C4-C5: Similar versus mildly progressed bilateral facet hypertrophy without significant canal stenosis. Similar mild bilateral foraminal stenosis.   C5-C6: Mild posterior osteophytic ridging and bilateral uncovertebral spurring. Similar moderate right and mild left foraminal stenosis. No significant canal stenosis.   C6-C7: Similar mild endplate and uncovertebral spurring without significant canal or foraminal stenosis.   C7-T1: No significant disc protrusion, foraminal stenosis, or canal stenosis.   IMPRESSION: 1. 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 Similar cord deformity at C2-C3 with chronic myelomalacia. 2. Progressive facet/uncovertebral hypertrophy at C3-C4 with moderate right and mild left  foraminal stenosis. Similar moderate right and mild left foraminal stenosis C5-C6 and mild bilateral foraminal stenosis at C4-C5.     Electronically Signed   By: Margaretha Sheffield MD   On: 12/10/2020 15:57    Abnormal CT scan, cervical spine (06/17/2014)   FINDINGS: The cervical  spine is imaged from the skullbase through T1-2. Ossification of posterior longitudinal ligament is present from the tip of the basal E on the Bruce C4-5. Fusion of anterior osteophytes is present from the C4-5 through C7-T1. There is fusion of the atlantoaxial joints bilaterally. Osteophyte formation and degenerative changes are noted at C1-2 lateral mass articulations bilaterally.  Posterior surgical fusion is noted C2-3 and C3-4 with lateral mass screws at C3-4 bilaterally. The posterior elements are fused at C5-6 scratch the the posterior elements are fused at C4-5 and C5-6 is well.  C1-2:  The foramina are patent.  C2-3: Central canal stenosis is secondary to the ossification of posterior longitudinal ligament. The foramina patent.  C3-4: Mild uncovertebral spurring and bilateral foraminal stenosis is evident.  C4-5:  Mild foraminal narrowing is worse on the left.  C5-6: Mild foraminal narrowing is worse on the left. The patient is status post left laminectomy.  C6-7: Mild foraminal narrowing is worse on the right due to uncovertebral spurring.  C7-T1:  Mild left foraminal stenosis is present.  IMPRESSION: 1. Ossification of the posterior longitudinal ligament from the tip of the base she on through C4-5. 2. With fusion of the atlantoaxial joint. 3. Degenerative lateral mass reticulation at C1-2. 4. Fusion of anterior osteophytes the C4-5 through C7-T1. 5. Surgical posterior fusion at C3-4 with additional posterior element fusion evident at C2-3, C4-5, and C5-6. 6. Multilevel foraminal narrowing as described due to uncovertebral and facet spurring.   Electronically Signed By: Lawrence Santiago M.D. On: 06/17/2014 16:15    Disease Related Peripheral Neuropathy  Osteoarthritis of hands (Bilateral)  Osteoarthritis of knees (Bilateral)  Osteoarthritis of feet (Bilateral)  Rheumatoid Factor Positive  Elevated Hemoglobin A1c  DM (Diabetes Mellitus), Type 2 With  Neurological Complications (Hcc)    Plan of Care  Pharmacotherapy (Medications Ordered): No orders of the defined types were placed in this encounter.  Procedure Orders    No procedure(s) ordered today   Lab Orders  No laboratory test(s) ordered today   Imaging Orders  No imaging studies ordered today   Referral Orders  No referral(s) requested today    Pharmacological management options:  Opioid Analgesics: I will not be prescribing any opioids at this time Membrane stabilizer: I will not be prescribing any at this time Muscle relaxant: I will not be prescribing any at this time NSAID: I will not be prescribing any at this time Other analgesic(s): None prescribed at this time     Interventional Therapies  Risk  Complexity Considerations:   Estimated body mass index is 38.77 kg/m as calculated from the following:   Height as of this encounter: $RemoveBeforeD'5\' 8"'[id number]$  (1.727 m).   Weight as of this encounter: 255 lb (115.7 kg). DM -uncontrolled Cervical myelomalacia   Planned  Pending:   Plan of care: Today we will be ordering a nerve conduction test of the upper and lower extremities and if it turns out that he has evidence of a diabetic peripheral neuropathy, we will consider doing treatments with Qutenza.  Otherwise, I have informed the patient that I have nothing else to offer since he is already done the trials for the gabapentin and the pregabalin.   Under  consideration:   Qutenza   Completed:   None at this time   Therapeutic  Palliative (PRN) options:   None established     Provider-requested follow-up: Return for after NCT. Recent Visits Date Type Provider Dept  11/10/20 Office Visit Milinda Pointer, MD [facility]  Showing recent visits within past 90 days and meeting all other requirements Today's Visits Date Type Provider Dept  01/21/21 Office Visit Milinda Pointer, MD [facility]  Showing today's visits and meeting all other  requirements Future Appointments No visits were found meeting these conditions. Showing future appointments within next 90 days and meeting all other requirements Primary Care Physician: Dion Body, MD Note by: Gaspar Cola, MD Date: 01/21/2021; Time: 11:51 AM

## 2021-03-09 ENCOUNTER — Encounter: Payer: Self-pay | Admitting: Pain Medicine

## 2021-05-05 NOTE — Progress Notes (Deleted)
Office Visit Note  Patient: Alexander Wilcox             Date of Birth: [birth year]           MRN: [id number]             PCP: Dion Body, MD Referring: Dion Body, MD Visit Date: 05/19/2021 Occupation: @GUAROCC @  Subjective:  No chief complaint on file.   History of Present Illness: Alexander Wilcox is a 70 y.o. male ***   Activities of Daily Living:  Patient reports morning stiffness for *** {minute/hour:19697}.   Patient {ACTIONS;DENIES/REPORTS:21021675::"Denies"} nocturnal pain.  Difficulty dressing/grooming: {ACTIONS;DENIES/REPORTS:21021675::"Denies"} Difficulty climbing stairs: {ACTIONS;DENIES/REPORTS:21021675::"Denies"} Difficulty getting out of chair: {ACTIONS;DENIES/REPORTS:21021675::"Denies"} Difficulty using hands for taps, buttons, cutlery, and/or writing: {ACTIONS;DENIES/REPORTS:21021675::"Denies"}  No Rheumatology ROS completed.   PMFS History:  Patient Active Problem List   Diagnosis Date Noted  . Diabetic peripheral neuropathy ([place]) 01/21/2021  . Elevated hemoglobin A1c 01/21/2021  . DM (diabetes mellitus), type 2 with neurological complications ([place]) 25/63/8937  . 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 Chronic pain syndrome 11/10/2020  . Pharmacologic therapy 11/10/2020  . Disorder of skeletal system 11/10/2020  . Problems influencing health status 11/10/2020  . Abnormal MRI, cervical spine (12/10/2020) 11/10/2020  . Abnormal CT scan, cervical spine (06/17/2014) 11/10/2020  . Abnormal MRI, lumbar spine (02/05/2013) 11/10/2020  . 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 History of lumbar surgery 11/10/2020  . Cervical spondylosis with myelopathy and radiculopathy 11/10/2020  . Cervical radiculitis (intermittent) 11/10/2020  . Chronic upper extremity pain (Bilateral) (intermittent) 11/10/2020  . Chronic lower extremity pain (Bilateral) (intermittent) 11/10/2020  . Gastroesophageal reflux disease without esophagitis 06/24/2020  .  B12 deficiency 01/10/2020  . Disease related peripheral neuropathy 01/10/2020  . History of rheumatoid arthritis 12/19/2019  . Medicare annual wellness visit, initial 09/18/2019  . Mild concentric left ventricular hypertrophy (LVH) 02/13/2019  . 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 History of hypertension 12/07/2016  . History of prostate cancer 12/07/2016  . Plantar fasciitis 12/07/2016  . Elevated LFTs 12/07/2016  . DDD (degenerative disc disease), lumbar s/p fusion  12/07/2016  . 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 DDD (degenerative disc disease), cervical s/p fusion  12/07/2016  . Osteoarthritis of hands (Bilateral) 12/07/2016  . Osteoarthritis of knees (Bilateral) 12/07/2016  . Osteoarthritis of feet (Bilateral) 12/07/2016  . Rheumatoid factor positive 12/07/2016  . History of sleep apnea 12/07/2016  . Hyperuricemia 12/07/2016  . Chronic right SI joint pain 12/07/2016  . DISH (diffuse idiopathic skeletal hyperostosis) 08/05/2016  . History of fusion of cervical spine 08/05/2016  . Mixed hyperlipidemia 07/01/2016  . Type 2 diabetes mellitus with hyperlipidemia ([place]) 06/23/2015  . Essential hypertension 05/22/2014  . Morbid obesity due to excess calories ([place]) 05/22/2014  . OSA on CPAP 05/22/2014    Past Medical History:  Diagnosis Date  . Arthritis   . Diabetes mellitus without complication ([place])   . Hypertension   . Prostate cancer ([place])   . Sleep apnea     Family History  Problem Relation Age of Onset  . Diabetes Sister   . Diabetes Maternal Grandmother   . Lung disease Brother   . Kidney failure Sister    Past Surgical History:  Procedure Laterality Date  . APPENDECTOMY    . BACK SURGERY    . KNEE ARTHROPLASTY    . PROSTATE SURGERY     Social History   Social History Narrative  . Not on file  Immunization History  Administered Date(s) Administered  . Janssen (J&J) SARS-COV-2 Vaccination 09/22/2019  . Moderna  Sars-Covid-2 Vaccination 05/06/2020  . Tdap 07/05/2013     Objective: Vital Signs: There were no vitals taken for this visit.   Physical Exam   Musculoskeletal Exam: ***  CDAI Exam: CDAI Score: -- Patient Global: --; Provider Global: -- Swollen: --; Tender: -- Joint Exam 05/19/2021   No joint exam has been documented for this visit   There is currently no information documented on the homunculus. Go to the Rheumatology activity and complete the homunculus joint exam.  Investigation: No additional findings.  Imaging: No results found.  Recent Labs: Lab Results  Component Value Date   WBC 5.4 12/14/2017   HGB 15.5 12/14/2017   PLT 148 12/14/2017   NA 138 11/10/2020   K 4.8 11/10/2020   CL 98 11/10/2020   CO2 28 12/14/2017   GLUCOSE 244 (H) 11/10/2020   BUN 25 11/10/2020   CREATININE 1.14 11/10/2020   BILITOT 0.5 11/10/2020   ALKPHOS 106 11/10/2020   AST 23 11/10/2020   ALT 75 (H) 12/14/2017   PROT 7.3 11/10/2020   ALBUMIN 4.7 11/10/2020   CALCIUM 10.2 11/10/2020   GFRAA 76 12/14/2017    Speciality Comments: No specialty comments available.  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  No orders of the defined types were placed in this encounter.   Face-to-face time spent with patient was *** minutes.  Greater than 50% of time was spent in counseling and coordination of care.  Follow-Up Instructions: No follow-ups on file.   Ofilia Neas, PA-C  Note - This record has been created using Dragon software.  Chart creation errors have been sought, but may not always  have been located. Such creation errors do not reflect on  the standard of medical care.

## 2021-05-19 ENCOUNTER — Ambulatory Visit: Payer: Medicare Other | Admitting: Rheumatology

## 2021-05-19 DIAGNOSIS — G8929 Other chronic pain: Secondary | ICD-10-CM

## 2021-05-19 DIAGNOSIS — Z8669 Personal history of other diseases of the nervous system and sense organs: Secondary | ICD-10-CM

## 2021-05-19 DIAGNOSIS — R7989 Other specified abnormal findings of blood chemistry: Secondary | ICD-10-CM

## 2021-05-19 DIAGNOSIS — M722 Plantar fascial fibromatosis: Secondary | ICD-10-CM

## 2021-05-19 DIAGNOSIS — Z8546 Personal history of malignant neoplasm of prostate: Secondary | ICD-10-CM

## 2021-05-19 DIAGNOSIS — Z8679 Personal history of other diseases of the circulatory system: Secondary | ICD-10-CM

## 2021-05-19 DIAGNOSIS — M5030 Other cervical disc degeneration, unspecified cervical region: Secondary | ICD-10-CM

## 2021-05-19 DIAGNOSIS — M5134 Other intervertebral disc degeneration, thoracic region: Secondary | ICD-10-CM

## 2021-05-19 DIAGNOSIS — E790 Hyperuricemia without signs of inflammatory arthritis and tophaceous disease: Secondary | ICD-10-CM

## 2021-05-19 DIAGNOSIS — M170 Bilateral primary osteoarthritis of knee: Secondary | ICD-10-CM

## 2021-05-19 DIAGNOSIS — M19071 Primary osteoarthritis, right ankle and foot: Secondary | ICD-10-CM

## 2021-05-19 DIAGNOSIS — M47819 Spondylosis without myelopathy or radiculopathy, site unspecified: Secondary | ICD-10-CM

## 2021-05-19 DIAGNOSIS — M5136 Other intervertebral disc degeneration, lumbar region: Secondary | ICD-10-CM

## 2021-05-19 DIAGNOSIS — R768 Other specified abnormal immunological findings in serum: Secondary | ICD-10-CM

## 2021-05-19 DIAGNOSIS — M19041 Primary osteoarthritis, right hand: Secondary | ICD-10-CM

## 2021-08-07 DIAGNOSIS — D400 Neoplasm of uncertain behavior of prostate: Secondary | ICD-10-CM | POA: Diagnosis not present

## 2021-08-07 DIAGNOSIS — H524 Presbyopia: Secondary | ICD-10-CM | POA: Diagnosis not present

## 2021-08-07 DIAGNOSIS — C61 Malignant neoplasm of prostate: Secondary | ICD-10-CM | POA: Diagnosis not present

## 2021-08-07 DIAGNOSIS — E119 Type 2 diabetes mellitus without complications: Secondary | ICD-10-CM | POA: Diagnosis not present

## 2021-08-20 DIAGNOSIS — R29818 Other symptoms and signs involving the nervous system: Secondary | ICD-10-CM | POA: Diagnosis not present

## 2021-08-20 DIAGNOSIS — E0942 Drug or chemical induced diabetes mellitus with neurological complications with diabetic polyneuropathy: Secondary | ICD-10-CM | POA: Diagnosis not present

## 2021-08-20 DIAGNOSIS — R200 Anesthesia of skin: Secondary | ICD-10-CM | POA: Diagnosis not present

## 2021-08-20 DIAGNOSIS — G9589 Other specified diseases of spinal cord: Secondary | ICD-10-CM | POA: Diagnosis not present

## 2021-08-20 DIAGNOSIS — M545 Low back pain, unspecified: Secondary | ICD-10-CM | POA: Diagnosis not present

## 2021-08-20 DIAGNOSIS — R202 Paresthesia of skin: Secondary | ICD-10-CM | POA: Diagnosis not present

## 2021-09-16 DIAGNOSIS — I10 Essential (primary) hypertension: Secondary | ICD-10-CM | POA: Diagnosis not present

## 2021-09-16 DIAGNOSIS — Z9989 Dependence on other enabling machines and devices: Secondary | ICD-10-CM | POA: Diagnosis not present

## 2021-09-16 DIAGNOSIS — I517 Cardiomegaly: Secondary | ICD-10-CM | POA: Diagnosis not present

## 2021-09-16 DIAGNOSIS — E1149 Type 2 diabetes mellitus with other diabetic neurological complication: Secondary | ICD-10-CM | POA: Diagnosis not present

## 2021-09-16 DIAGNOSIS — E782 Mixed hyperlipidemia: Secondary | ICD-10-CM | POA: Diagnosis not present

## 2021-09-16 DIAGNOSIS — G4733 Obstructive sleep apnea (adult) (pediatric): Secondary | ICD-10-CM | POA: Diagnosis not present

## 2021-09-17 DIAGNOSIS — E1169 Type 2 diabetes mellitus with other specified complication: Secondary | ICD-10-CM | POA: Diagnosis not present

## 2021-09-17 DIAGNOSIS — E538 Deficiency of other specified B group vitamins: Secondary | ICD-10-CM | POA: Diagnosis not present

## 2021-09-17 DIAGNOSIS — E1149 Type 2 diabetes mellitus with other diabetic neurological complication: Secondary | ICD-10-CM | POA: Diagnosis not present

## 2021-09-17 DIAGNOSIS — I10 Essential (primary) hypertension: Secondary | ICD-10-CM | POA: Diagnosis not present

## 2021-09-17 DIAGNOSIS — E782 Mixed hyperlipidemia: Secondary | ICD-10-CM | POA: Diagnosis not present

## 2021-09-17 DIAGNOSIS — E785 Hyperlipidemia, unspecified: Secondary | ICD-10-CM | POA: Diagnosis not present

## 2021-09-25 DIAGNOSIS — Z1389 Encounter for screening for other disorder: Secondary | ICD-10-CM | POA: Diagnosis not present

## 2021-09-25 DIAGNOSIS — Z0000 Encounter for general adult medical examination without abnormal findings: Secondary | ICD-10-CM | POA: Diagnosis not present

## 2021-09-25 DIAGNOSIS — I10 Essential (primary) hypertension: Secondary | ICD-10-CM | POA: Diagnosis not present

## 2021-09-25 DIAGNOSIS — E119 Type 2 diabetes mellitus without complications: Secondary | ICD-10-CM | POA: Diagnosis not present

## 2021-09-25 DIAGNOSIS — E785 Hyperlipidemia, unspecified: Secondary | ICD-10-CM | POA: Diagnosis not present

## 2021-10-27 DIAGNOSIS — R202 Paresthesia of skin: Secondary | ICD-10-CM | POA: Diagnosis not present

## 2021-10-27 DIAGNOSIS — R200 Anesthesia of skin: Secondary | ICD-10-CM | POA: Diagnosis not present

## 2021-10-27 DIAGNOSIS — E0942 Drug or chemical induced diabetes mellitus with neurological complications with diabetic polyneuropathy: Secondary | ICD-10-CM | POA: Diagnosis not present

## 2021-10-27 DIAGNOSIS — R29818 Other symptoms and signs involving the nervous system: Secondary | ICD-10-CM | POA: Diagnosis not present

## 2021-10-27 DIAGNOSIS — G9589 Other specified diseases of spinal cord: Secondary | ICD-10-CM | POA: Diagnosis not present

## 2021-12-04 DIAGNOSIS — E669 Obesity, unspecified: Secondary | ICD-10-CM | POA: Diagnosis not present

## 2021-12-04 DIAGNOSIS — R197 Diarrhea, unspecified: Secondary | ICD-10-CM | POA: Diagnosis not present

## 2021-12-04 DIAGNOSIS — Z7984 Long term (current) use of oral hypoglycemic drugs: Secondary | ICD-10-CM | POA: Diagnosis not present

## 2021-12-04 DIAGNOSIS — S8001XA Contusion of right knee, initial encounter: Secondary | ICD-10-CM | POA: Diagnosis not present

## 2021-12-04 DIAGNOSIS — S0990XA Unspecified injury of head, initial encounter: Secondary | ICD-10-CM | POA: Diagnosis not present

## 2021-12-04 DIAGNOSIS — R112 Nausea with vomiting, unspecified: Secondary | ICD-10-CM | POA: Diagnosis not present

## 2021-12-04 DIAGNOSIS — S8991XA Unspecified injury of right lower leg, initial encounter: Secondary | ICD-10-CM | POA: Diagnosis not present

## 2021-12-04 DIAGNOSIS — Z7982 Long term (current) use of aspirin: Secondary | ICD-10-CM | POA: Diagnosis not present

## 2021-12-04 DIAGNOSIS — R519 Headache, unspecified: Secondary | ICD-10-CM | POA: Diagnosis not present

## 2021-12-04 DIAGNOSIS — E119 Type 2 diabetes mellitus without complications: Secondary | ICD-10-CM | POA: Diagnosis not present

## 2021-12-04 DIAGNOSIS — M1711 Unilateral primary osteoarthritis, right knee: Secondary | ICD-10-CM | POA: Diagnosis not present

## 2021-12-04 DIAGNOSIS — I10 Essential (primary) hypertension: Secondary | ICD-10-CM | POA: Diagnosis not present

## 2021-12-10 DIAGNOSIS — A084 Viral intestinal infection, unspecified: Secondary | ICD-10-CM | POA: Diagnosis not present

## 2021-12-23 DIAGNOSIS — J020 Streptococcal pharyngitis: Secondary | ICD-10-CM | POA: Diagnosis not present

## 2021-12-23 DIAGNOSIS — J029 Acute pharyngitis, unspecified: Secondary | ICD-10-CM | POA: Diagnosis not present

## 2022-01-19 DIAGNOSIS — E782 Mixed hyperlipidemia: Secondary | ICD-10-CM | POA: Diagnosis not present

## 2022-01-19 DIAGNOSIS — E785 Hyperlipidemia, unspecified: Secondary | ICD-10-CM | POA: Diagnosis not present

## 2022-01-19 DIAGNOSIS — E1169 Type 2 diabetes mellitus with other specified complication: Secondary | ICD-10-CM | POA: Diagnosis not present

## 2022-01-19 DIAGNOSIS — E1165 Type 2 diabetes mellitus with hyperglycemia: Secondary | ICD-10-CM | POA: Diagnosis not present

## 2022-01-19 DIAGNOSIS — E1159 Type 2 diabetes mellitus with other circulatory complications: Secondary | ICD-10-CM | POA: Diagnosis not present

## 2022-01-19 DIAGNOSIS — I10 Essential (primary) hypertension: Secondary | ICD-10-CM | POA: Diagnosis not present

## 2022-01-19 DIAGNOSIS — I152 Hypertension secondary to endocrine disorders: Secondary | ICD-10-CM | POA: Diagnosis not present

## 2022-01-29 DIAGNOSIS — E1142 Type 2 diabetes mellitus with diabetic polyneuropathy: Secondary | ICD-10-CM | POA: Diagnosis not present

## 2022-01-29 DIAGNOSIS — I10 Essential (primary) hypertension: Secondary | ICD-10-CM | POA: Diagnosis not present

## 2022-01-29 DIAGNOSIS — E782 Mixed hyperlipidemia: Secondary | ICD-10-CM | POA: Diagnosis not present

## 2022-02-12 DIAGNOSIS — R42 Dizziness and giddiness: Secondary | ICD-10-CM | POA: Diagnosis not present

## 2022-02-19 DIAGNOSIS — D400 Neoplasm of uncertain behavior of prostate: Secondary | ICD-10-CM | POA: Diagnosis not present

## 2022-02-19 DIAGNOSIS — C61 Malignant neoplasm of prostate: Secondary | ICD-10-CM | POA: Diagnosis not present

## 2022-02-28 IMAGING — MR MR SHOULDER*L* W/O CM
4 of 6 series · 27 of 40 positions shown · non-contrast
Comparison: None.

CLINICAL DATA: Left shoulder pain and limited range of motion.
Prior humeral fracture July 2020. History of prostate cancer with
biochemical recurrence.

EXAM:
MRI OF THE LEFT SHOULDER WITHOUT CONTRAST
TECHNIQUE: Multiplanar, multisequence MR imaging of the shoulder was performed.
No intravenous contrast was administered.

[Series 5: T2 fat-sat · axial · 4.0mm · 0.50mm/px · z∈[-56,+63]mm · 7 of 26 slices shown (1 of 3)]
[im 1/26]
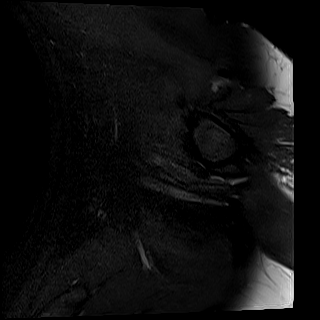
[im 5/26]
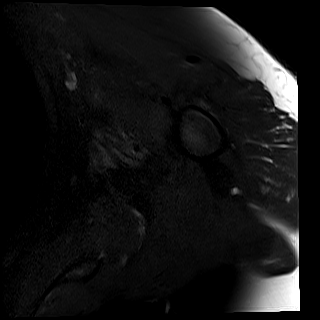
[im 9/26]
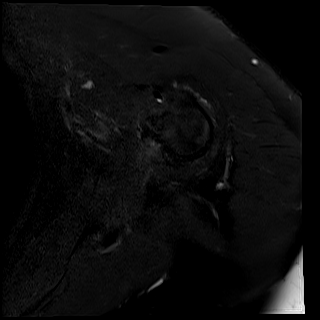
[im 13/26]
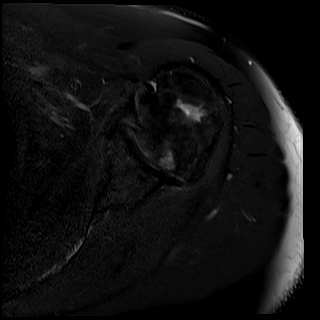
[im 17/26]
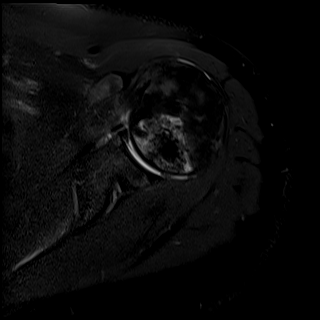
[im 21/26]
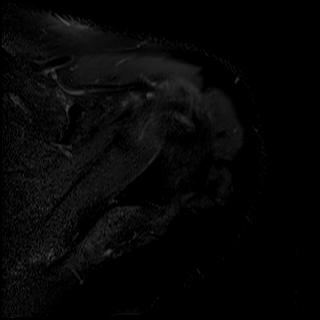
[im 26/26]
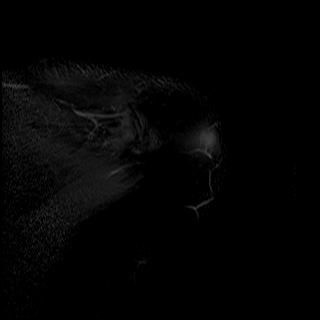

[Series 6: PD · oblique · 4.0mm · 0.44mm/px · 8 of 30 slices shown]
[im 1/30]
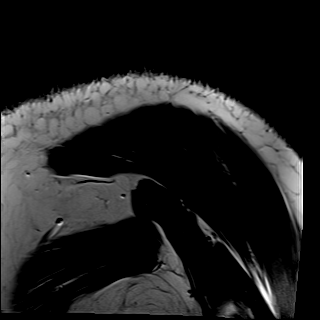
[im 5/30]
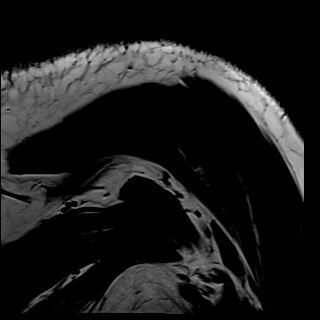
[im 9/30]
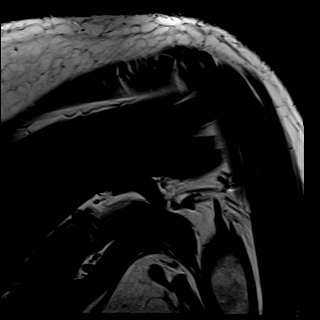
[im 13/30]
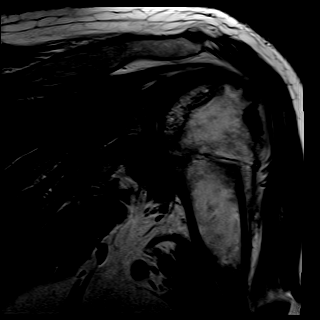
[im 17/30]
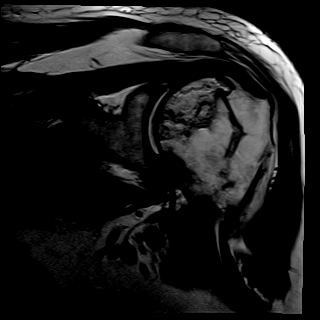
[im 21/30]
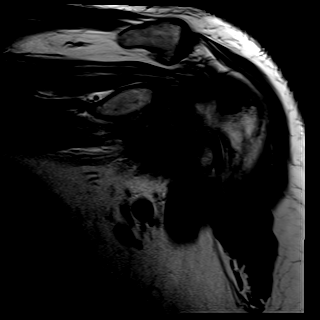
[im 25/30]
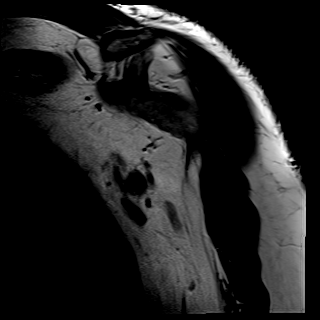
[im 30/30]
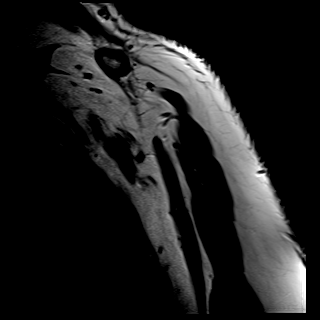

[Series 7: T2 fat-sat · oblique · 4.0mm · 0.44mm/px · 7 of 30 slices shown (2 of 3)]
[im 1/30]
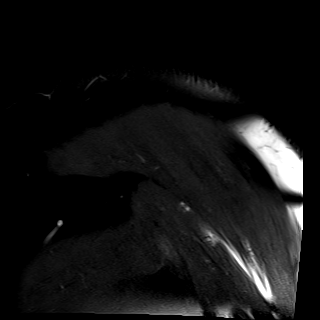
[im 5/30]
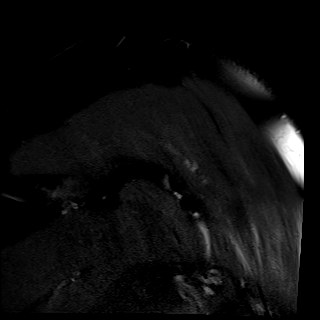
[im 10/30]
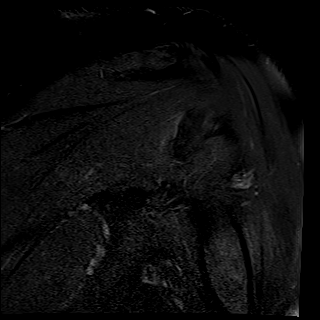
[im 15/30]
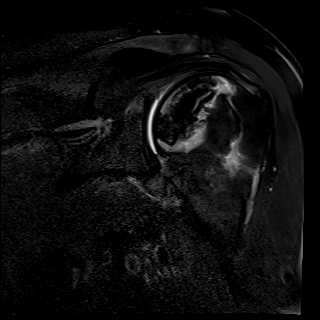
[im 20/30]
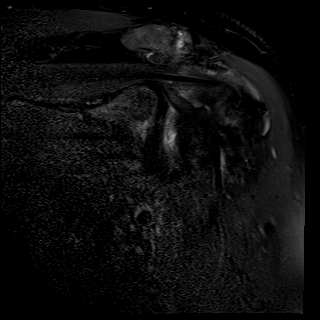
[im 25/30]
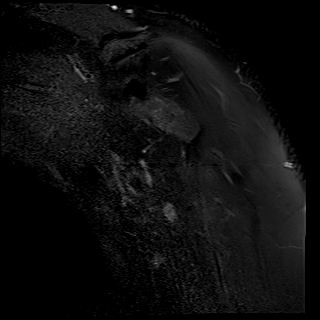
[im 30/30]
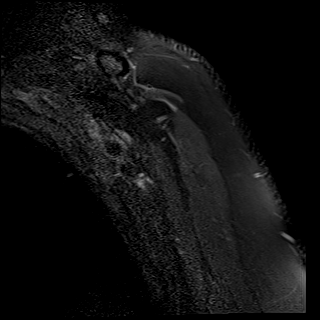

[Series 8: T2 fat-sat · oblique · 4.0mm · 0.22mm/px · 5 of 26 slices shown (3 of 3)]
[im 1/26]
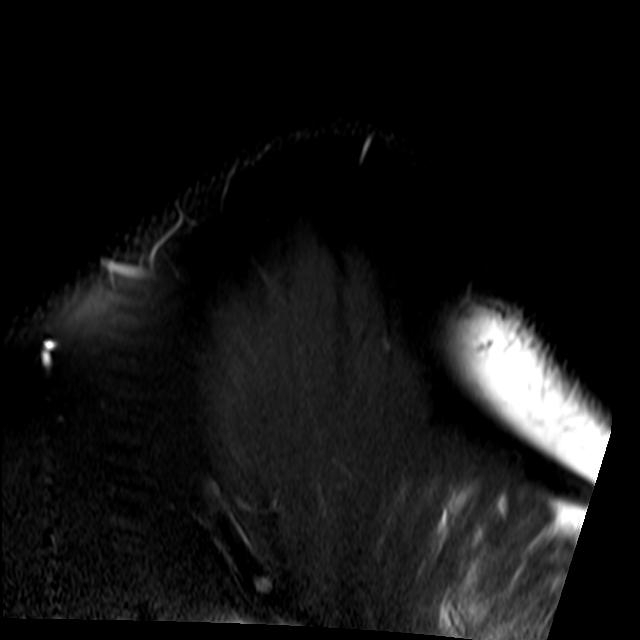
[im 6/26]
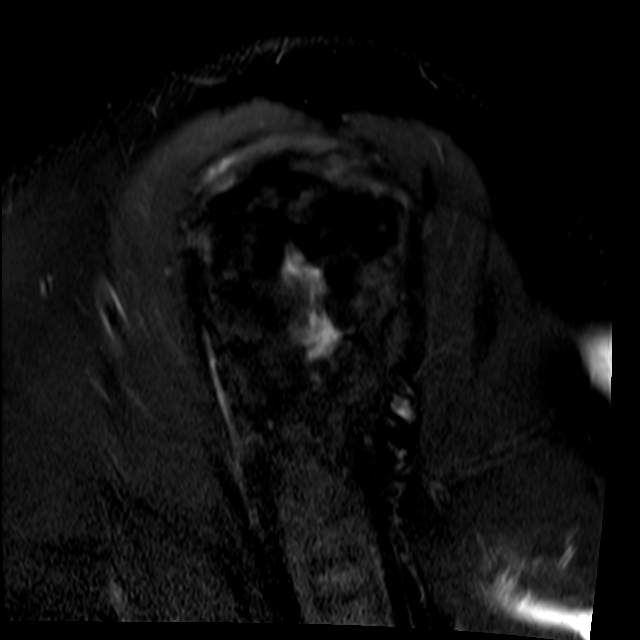
[im 11/26]
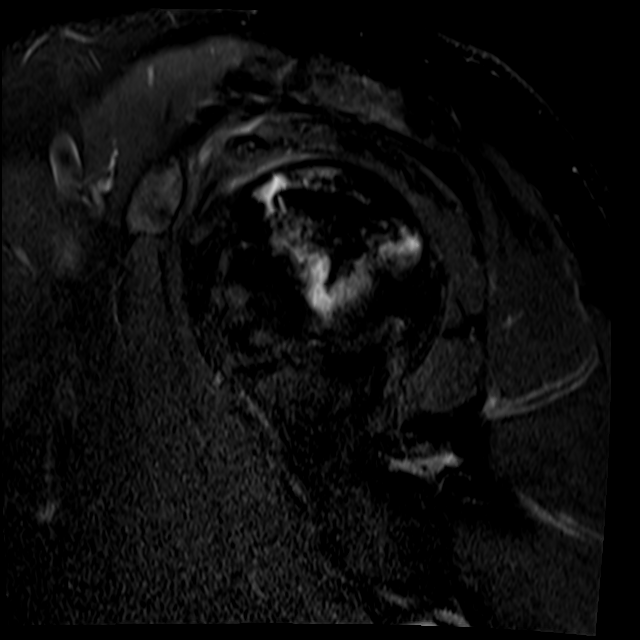
[im 16/26]
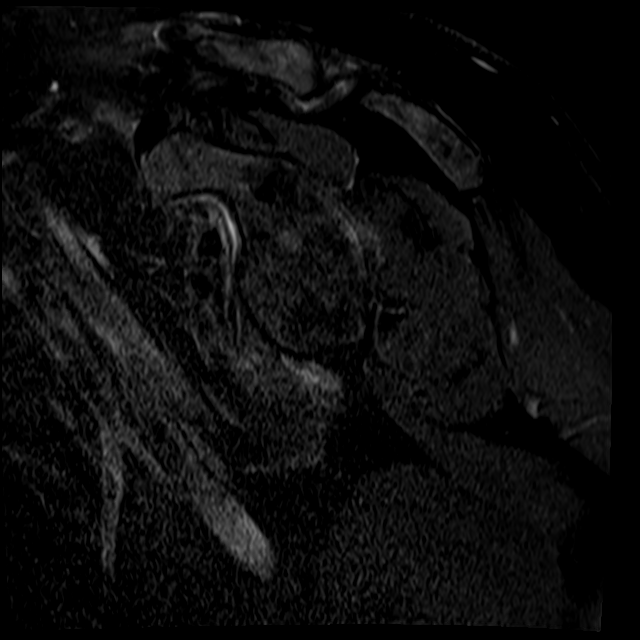
[im 26/26]
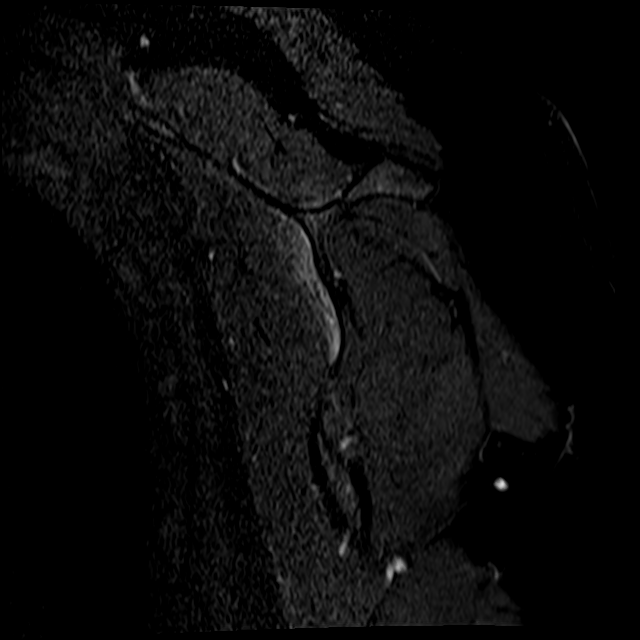

[27 of 40 positions shown; findings below may reference images not displayed]

FINDINGS: Rotator cuff: Mild supraspinatus and infraspinatus tendinopathy. No
rotator cuff tear identified.

Muscles: Subtle accentuated T2 signal along the scapular margin of
the subscapularis muscle on image 1 series 8 and image 11 series 5.

Biceps long head: Moderate tendinopathy of the intra-articular
segment of long head.

Acromioclavicular Joint: Moderate spurring with a small amount of
fluid signal in the joint. The spurring mildly indents the upper
margin of the supraspinatus muscle. Type II acromion. Trace
subacromial subdeltoid bursitis.

Glenohumeral Joint: Reasonably preserved articular cartilage. The
surgical neck component of the prior fracture is healed with a
substantial divot in the cortex between the humeral head and the
metaphysis on image 15 of series 6 which could potentially catch on
the inferior glenoid during range of motion. The metaphysis has
healed in about 8 mm of medial displacement with respect to the
humeral head. There is low signal intensity within this divot and
within the axillary pouch in this region suggesting local synovitis
as shown on images 12 through 18 of series 6. I not see substantial
synovitis in the rotator interval and may be that the synovitis
along the axillary pouch is due to irritation during motion.

Labrum:  No well-defined labral tear identified.

Bones: In addition to the previously described a healed surgical
neck fracture, there is a fracture plane versus marginal band from
avascular necrosis extending along the anatomic neck and partially
into the head of the humerus as shown on images 13 through 19 of
series 7. I do suspect some degree of avascular necrosis with
revascularization given the difference in marrow between the humeral
head and the remainder of the humerus. Some of the margins of this
process are also serpentine as can often be seen in setting of
avascular necrosis, but it may be AVN secondary to an original
fracture plane.

There also appears to of originally been a fracture plane through
the base of the greater tuberosity which has mostly healed.

Other: No supplemental non-categorized findings.
IMPRESSION: 1. Healed proximal humeral fracture with original surgical neck
component in which the metaphyseal portion has healed with about 8
mm medial displacement with respect to the humeral head, resulting
in a somewhat deep divot which may be catching along the inferior
glenoid. There is substantial synovitis in this area and in the
adjacent axillary pouch as shown for example on image 15 series [DATE]. There is a component of avascular necrosis involving the humeral
head with margin tracking mostly along the anatomic neck and
partially in the humeral head itself.
3. A component of the original fracture probably extended through
the base of the greater tuberosity; this fracture has healed with
only slight residual visualization.
4. Mild supraspinatus and infraspinatus tendinopathy.
5. Subtle T2 signal along the scapular margin of the subscapularis
muscle. I not see a definite associated scapular fracture. Mild to
moderate degenerative AC joint arthropathy.
6. Trace subacromial subdeltoid bursitis.

## 2022-04-06 DIAGNOSIS — I517 Cardiomegaly: Secondary | ICD-10-CM | POA: Diagnosis not present

## 2022-04-06 DIAGNOSIS — E782 Mixed hyperlipidemia: Secondary | ICD-10-CM | POA: Diagnosis not present

## 2022-04-06 DIAGNOSIS — E785 Hyperlipidemia, unspecified: Secondary | ICD-10-CM | POA: Diagnosis not present

## 2022-04-06 DIAGNOSIS — G4733 Obstructive sleep apnea (adult) (pediatric): Secondary | ICD-10-CM | POA: Diagnosis not present

## 2022-04-06 DIAGNOSIS — I10 Essential (primary) hypertension: Secondary | ICD-10-CM | POA: Diagnosis not present

## 2022-04-06 DIAGNOSIS — E1169 Type 2 diabetes mellitus with other specified complication: Secondary | ICD-10-CM | POA: Diagnosis not present

## 2022-05-19 DIAGNOSIS — E782 Mixed hyperlipidemia: Secondary | ICD-10-CM | POA: Diagnosis not present

## 2022-05-19 DIAGNOSIS — E1165 Type 2 diabetes mellitus with hyperglycemia: Secondary | ICD-10-CM | POA: Diagnosis not present

## 2022-05-19 DIAGNOSIS — I10 Essential (primary) hypertension: Secondary | ICD-10-CM | POA: Diagnosis not present

## 2022-05-25 DIAGNOSIS — E1165 Type 2 diabetes mellitus with hyperglycemia: Secondary | ICD-10-CM | POA: Diagnosis not present

## 2022-05-25 DIAGNOSIS — E1159 Type 2 diabetes mellitus with other circulatory complications: Secondary | ICD-10-CM | POA: Diagnosis not present

## 2022-05-25 DIAGNOSIS — E785 Hyperlipidemia, unspecified: Secondary | ICD-10-CM | POA: Diagnosis not present

## 2022-05-25 DIAGNOSIS — E1169 Type 2 diabetes mellitus with other specified complication: Secondary | ICD-10-CM | POA: Diagnosis not present

## 2022-05-25 DIAGNOSIS — I152 Hypertension secondary to endocrine disorders: Secondary | ICD-10-CM | POA: Diagnosis not present

## 2022-05-26 DIAGNOSIS — E782 Mixed hyperlipidemia: Secondary | ICD-10-CM | POA: Diagnosis not present

## 2022-05-26 DIAGNOSIS — M069 Rheumatoid arthritis, unspecified: Secondary | ICD-10-CM | POA: Diagnosis not present

## 2022-05-26 DIAGNOSIS — I10 Essential (primary) hypertension: Secondary | ICD-10-CM | POA: Diagnosis not present

## 2022-05-26 DIAGNOSIS — I110 Hypertensive heart disease with heart failure: Secondary | ICD-10-CM | POA: Diagnosis not present

## 2022-05-26 DIAGNOSIS — E1169 Type 2 diabetes mellitus with other specified complication: Secondary | ICD-10-CM | POA: Diagnosis not present

## 2022-05-26 DIAGNOSIS — I509 Heart failure, unspecified: Secondary | ICD-10-CM | POA: Diagnosis not present

## 2022-08-31 DIAGNOSIS — H43393 Other vitreous opacities, bilateral: Secondary | ICD-10-CM | POA: Diagnosis not present

## 2022-08-31 DIAGNOSIS — H269 Unspecified cataract: Secondary | ICD-10-CM | POA: Diagnosis not present

## 2022-08-31 DIAGNOSIS — H2513 Age-related nuclear cataract, bilateral: Secondary | ICD-10-CM | POA: Diagnosis not present

## 2022-08-31 DIAGNOSIS — E119 Type 2 diabetes mellitus without complications: Secondary | ICD-10-CM | POA: Diagnosis not present

## 2022-09-20 ENCOUNTER — Encounter: Payer: Self-pay | Admitting: Urology

## 2022-09-20 ENCOUNTER — Ambulatory Visit: Payer: Medicare HMO | Admitting: Urology

## 2022-09-20 VITALS — BP 126/76 | HR 83 | Ht 66.0 in | Wt 264.0 lb

## 2022-09-20 DIAGNOSIS — C61 Malignant neoplasm of prostate: Secondary | ICD-10-CM

## 2022-09-20 NOTE — Progress Notes (Signed)
09/20/2022 2:37 PM   Alexander Wilcox 1951/03/01 [id number]  Referring provider: Dion Body, MD [facility] [place],  [place] [address]  Chief Complaint  Patient presents with   Prostate Cancer    History    HPI: Alexander Wilcox is a 72 y.o. male who presents to transfer urologic care after the retirement of his prior urologist.  Previously followed by Dr. Yves Dill for prostate cancer. Initially diagnosed ~ 2009 though scanned records are incomplete regarding initial diagnosis Gleason 4+5 recurrence in 2017 treated with EBRT PSA recurrence 4.09 May 2020 and started on leuprolide + apalutamide.  PSMA/PET showed no evidence of metastatic disease or focal activity in the prostate though the patient states this was done 3 months after he started androgen blockade He states the plan was to discontinue therapy April 2024.  His last reapplied injection was a 78-month depot given in October 2023 PSA at that time was undetectable at <0.1   PMH: Past Medical History:  Diagnosis Date   Arthritis    Diabetes mellitus without complication ([place])    Hypertension    Prostate cancer ([place])    Sleep apnea     Surgical History: Past Surgical History:  Procedure Laterality Date   APPENDECTOMY     BACK SURGERY     KNEE ARTHROPLASTY     PROSTATE SURGERY      Home Medications:  Allergies as of 09/20/2022       Reactions   Dilaudid [hydromorphone Hcl] Itching        Medication List        Accurate as of September 20, 2022  2:37 PM. If you have any questions, ask your nurse or doctor.          aspirin EC 81 MG tablet Take 81 mg by mouth daily.   atorvastatin 40 MG tablet Commonly known as: LIPITOR Take 40 mg by mouth daily.   cyanocobalamin 1000 MCG tablet Take 1,000 mcg by mouth daily.   cyclobenzaprine 10 MG tablet Commonly known as: FLEXERIL Take by mouth.   dapagliflozin propanediol 10 MG Tabs tablet Commonly known as:  FARXIGA Take by mouth daily.   DULoxetine 30 MG capsule Commonly known as: CYMBALTA Take 1 capsule (30 mg total) by mouth daily.   Erleada 60 MG tablet Generic drug: apalutamide Take 240 mg by mouth daily.   fenofibrate 160 MG tablet TAKE 1 TABLET DAILY   gabapentin 800 MG tablet Commonly known as: NEURONTIN Take 800 mg by mouth 3 (three) times daily.   glipiZIDE 10 MG tablet Commonly known as: GLUCOTROL Take 20 mg by mouth every morning.   HYDROcodone-acetaminophen 10-325 MG tablet Commonly known as: NORCO Take by mouth.   Lancets 30G Misc 1 each.   leuprolide acetate (6 Month) 45 MG injection Generic drug: leuprolide (6 Month) Inject 45 mg into the skin every 6 (six) months.   metFORMIN 500 MG 24 hr tablet Commonly known as: GLUCOPHAGE-XR Take 1 tablet by mouth 2 (two) times daily. Pt taking one in the morning and one at night   metoprolol succinate 25 MG 24 hr tablet Commonly known as: TOPROL-XL Take 25 mg by mouth 2 (two) times daily.   omeprazole 40 MG capsule Commonly known as: PRILOSEC Take 40 mg by mouth daily.   Procysbi 300 MG Pack Generic drug: Cysteamine Bitartrate Check CBG's fasting once daily. HUMANA TRUE METRIX AIR Dx: E11.69   quinapril-hydrochlorothiazide 10-12.5 MG tablet Commonly known as: ACCURETIC Take 1  tablet by mouth daily.   solifenacin 10 MG tablet Commonly known as: VESICARE Take 10 mg by mouth daily.   tamsulosin 0.4 MG Caps capsule Commonly known as: FLOMAX Take 0.4 mg by mouth daily.   Toujeo Max SoloStar 300 UNIT/ML Solostar Pen Generic drug: insulin glargine (2 Unit Dial) Inject into the skin.   Toujeo SoloStar 300 UNIT/ML Solostar Pen Generic drug: insulin glargine (1 Unit Dial)   UltiGuard SafePack Pen Needle 32G X 4 MM Misc Generic drug: Insulin Pen Needle Use 1 each once daily        Allergies:  Allergies  Allergen Reactions   Dilaudid [Hydromorphone Hcl] Itching    Family History: Family History   Problem Relation Age of Onset   Diabetes Sister    Diabetes Maternal Grandmother    Lung disease Brother    Kidney failure Sister     Social History:  reports that he quit smoking about 26 years ago. His smoking use included cigarettes. He has a 60.00 pack-year smoking history. He has never used smokeless tobacco. He reports current alcohol use. He reports that he does not currently use drugs.   Physical Exam: BP 126/76   Pulse 83   Ht 5\' 6"  (1.676 m)   Wt 264 lb (119.7 kg)   BMI 42.61 kg/m   Constitutional:  Alert and oriented, No acute distress. HEENT: [place] AT Respiratory: Normal respiratory effort, no increased work of breathing. Psychiatric: Normal mood and affect.   Assessment & Plan:    1.  Prostate cancer Follow-up April 2024 for lab visit with PSA Office visit October 2024 with [place], [place] [address] [place], [place] [address] [phone]

## 2022-10-11 ENCOUNTER — Other Ambulatory Visit: Payer: Medicare HMO

## 2022-10-11 DIAGNOSIS — Z87891 Personal history of nicotine dependence: Secondary | ICD-10-CM | POA: Diagnosis not present

## 2022-10-11 DIAGNOSIS — I110 Hypertensive heart disease with heart failure: Secondary | ICD-10-CM | POA: Diagnosis not present

## 2022-10-11 DIAGNOSIS — I10 Essential (primary) hypertension: Secondary | ICD-10-CM | POA: Diagnosis not present

## 2022-10-11 DIAGNOSIS — G4733 Obstructive sleep apnea (adult) (pediatric): Secondary | ICD-10-CM | POA: Diagnosis not present

## 2022-10-11 DIAGNOSIS — E119 Type 2 diabetes mellitus without complications: Secondary | ICD-10-CM | POA: Diagnosis not present

## 2022-10-11 DIAGNOSIS — Z7982 Long term (current) use of aspirin: Secondary | ICD-10-CM | POA: Diagnosis not present

## 2022-10-11 DIAGNOSIS — C61 Malignant neoplasm of prostate: Secondary | ICD-10-CM | POA: Diagnosis not present

## 2022-10-11 DIAGNOSIS — E785 Hyperlipidemia, unspecified: Secondary | ICD-10-CM | POA: Diagnosis not present

## 2022-10-11 DIAGNOSIS — Z794 Long term (current) use of insulin: Secondary | ICD-10-CM | POA: Diagnosis not present

## 2022-10-11 DIAGNOSIS — E669 Obesity, unspecified: Secondary | ICD-10-CM | POA: Diagnosis not present

## 2022-10-12 ENCOUNTER — Encounter: Payer: Self-pay | Admitting: Urology

## 2022-10-12 LAB — PSA: Prostate Specific Ag, Serum: <0.1 ng/mL (ref 0.0–4.0)

## 2022-11-03 DIAGNOSIS — A419 Sepsis, unspecified organism: Secondary | ICD-10-CM

## 2022-11-03 DIAGNOSIS — S22079A Unspecified fracture of T9-T10 vertebra, initial encounter for closed fracture: Secondary | ICD-10-CM

## 2022-11-03 HISTORY — DX: Unspecified fracture of t9-t10 vertebra, initial encounter for closed fracture: S22.079A

## 2022-11-03 HISTORY — DX: Sepsis, unspecified organism: A41.9

## 2022-11-11 ENCOUNTER — Emergency Department: Payer: Medicare HMO

## 2022-11-11 ENCOUNTER — Inpatient Hospital Stay
Admission: EM | Admit: 2022-11-11 | Discharge: 2022-11-18 | DRG: 460 | Disposition: A | Discharge status: Skilled Nursing Facility | Payer: Medicare HMO | Attending: Internal Medicine | Admitting: Internal Medicine

## 2022-11-11 ENCOUNTER — Encounter: Payer: Self-pay | Admitting: Emergency Medicine

## 2022-11-11 DIAGNOSIS — M532X4 Spinal instabilities, thoracic region: Secondary | ICD-10-CM

## 2022-11-11 DIAGNOSIS — J90 Pleural effusion, not elsewhere classified: Secondary | ICD-10-CM | POA: Diagnosis not present

## 2022-11-11 DIAGNOSIS — K567 Ileus, unspecified: Secondary | ICD-10-CM | POA: Diagnosis not present

## 2022-11-11 DIAGNOSIS — C61 Malignant neoplasm of prostate: Secondary | ICD-10-CM | POA: Diagnosis present

## 2022-11-11 DIAGNOSIS — E871 Hypo-osmolality and hyponatremia: Secondary | ICD-10-CM | POA: Diagnosis present

## 2022-11-11 DIAGNOSIS — Z043 Encounter for examination and observation following other accident: Secondary | ICD-10-CM | POA: Diagnosis not present

## 2022-11-11 DIAGNOSIS — N179 Acute kidney failure, unspecified: Secondary | ICD-10-CM | POA: Diagnosis not present

## 2022-11-11 DIAGNOSIS — G4733 Obstructive sleep apnea (adult) (pediatric): Secondary | ICD-10-CM | POA: Diagnosis present

## 2022-11-11 DIAGNOSIS — S3991XA Unspecified injury of abdomen, initial encounter: Secondary | ICD-10-CM | POA: Diagnosis not present

## 2022-11-11 DIAGNOSIS — Z794 Long term (current) use of insulin: Secondary | ICD-10-CM

## 2022-11-11 DIAGNOSIS — Z713 Dietary counseling and surveillance: Secondary | ICD-10-CM

## 2022-11-11 DIAGNOSIS — R29810 Facial weakness: Secondary | ICD-10-CM | POA: Diagnosis not present

## 2022-11-11 DIAGNOSIS — Y92017 Garden or yard in single-family (private) house as the place of occurrence of the external cause: Secondary | ICD-10-CM

## 2022-11-11 DIAGNOSIS — Z7982 Long term (current) use of aspirin: Secondary | ICD-10-CM

## 2022-11-11 DIAGNOSIS — K5900 Constipation, unspecified: Secondary | ICD-10-CM | POA: Insufficient documentation

## 2022-11-11 DIAGNOSIS — W1839XA Other fall on same level, initial encounter: Secondary | ICD-10-CM | POA: Diagnosis present

## 2022-11-11 DIAGNOSIS — M4815 Ankylosing hyperostosis [Forestier], thoracolumbar region: Secondary | ICD-10-CM | POA: Diagnosis present

## 2022-11-11 DIAGNOSIS — R4781 Slurred speech: Secondary | ICD-10-CM | POA: Diagnosis not present

## 2022-11-11 DIAGNOSIS — I10 Essential (primary) hypertension: Secondary | ICD-10-CM | POA: Diagnosis present

## 2022-11-11 DIAGNOSIS — M1990 Unspecified osteoarthritis, unspecified site: Secondary | ICD-10-CM | POA: Diagnosis present

## 2022-11-11 DIAGNOSIS — M25522 Pain in left elbow: Secondary | ICD-10-CM | POA: Diagnosis not present

## 2022-11-11 DIAGNOSIS — S22070A Wedge compression fracture of T9-T10 vertebra, initial encounter for closed fracture: Secondary | ICD-10-CM | POA: Diagnosis not present

## 2022-11-11 DIAGNOSIS — M7989 Other specified soft tissue disorders: Secondary | ICD-10-CM | POA: Diagnosis not present

## 2022-11-11 DIAGNOSIS — Z833 Family history of diabetes mellitus: Secondary | ICD-10-CM

## 2022-11-11 DIAGNOSIS — R319 Hematuria, unspecified: Secondary | ICD-10-CM | POA: Diagnosis not present

## 2022-11-11 DIAGNOSIS — Z885 Allergy status to narcotic agent status: Secondary | ICD-10-CM

## 2022-11-11 DIAGNOSIS — E1165 Type 2 diabetes mellitus with hyperglycemia: Secondary | ICD-10-CM | POA: Diagnosis present

## 2022-11-11 DIAGNOSIS — Z7984 Long term (current) use of oral hypoglycemic drugs: Secondary | ICD-10-CM

## 2022-11-11 DIAGNOSIS — S22078A Other fracture of T9-T10 vertebra, initial encounter for closed fracture: Secondary | ICD-10-CM | POA: Diagnosis not present

## 2022-11-11 DIAGNOSIS — E1140 Type 2 diabetes mellitus with diabetic neuropathy, unspecified: Secondary | ICD-10-CM | POA: Diagnosis present

## 2022-11-11 DIAGNOSIS — S52125A Nondisplaced fracture of head of left radius, initial encounter for closed fracture: Secondary | ICD-10-CM | POA: Diagnosis not present

## 2022-11-11 DIAGNOSIS — Z87891 Personal history of nicotine dependence: Secondary | ICD-10-CM

## 2022-11-11 DIAGNOSIS — Z041 Encounter for examination and observation following transport accident: Secondary | ICD-10-CM | POA: Diagnosis not present

## 2022-11-11 DIAGNOSIS — S22078D Other fracture of T9-T10 vertebra, subsequent encounter for fracture with routine healing: Secondary | ICD-10-CM | POA: Diagnosis not present

## 2022-11-11 DIAGNOSIS — M4324 Fusion of spine, thoracic region: Secondary | ICD-10-CM | POA: Diagnosis not present

## 2022-11-11 DIAGNOSIS — Z79891 Long term (current) use of opiate analgesic: Secondary | ICD-10-CM

## 2022-11-11 DIAGNOSIS — T1490XA Injury, unspecified, initial encounter: Principal | ICD-10-CM

## 2022-11-11 DIAGNOSIS — S22072A Unstable burst fracture of T9-T10 vertebra, initial encounter for closed fracture: Secondary | ICD-10-CM | POA: Diagnosis not present

## 2022-11-11 DIAGNOSIS — N390 Urinary tract infection, site not specified: Secondary | ICD-10-CM | POA: Diagnosis not present

## 2022-11-11 DIAGNOSIS — R0781 Pleurodynia: Secondary | ICD-10-CM | POA: Diagnosis not present

## 2022-11-11 DIAGNOSIS — R7401 Elevation of levels of liver transaminase levels: Secondary | ICD-10-CM

## 2022-11-11 DIAGNOSIS — S22079A Unspecified fracture of T9-T10 vertebra, initial encounter for closed fracture: Secondary | ICD-10-CM | POA: Diagnosis not present

## 2022-11-11 DIAGNOSIS — D72829 Elevated white blood cell count, unspecified: Secondary | ICD-10-CM | POA: Diagnosis present

## 2022-11-11 DIAGNOSIS — S22008A Other fracture of unspecified thoracic vertebra, initial encounter for closed fracture: Secondary | ICD-10-CM

## 2022-11-11 DIAGNOSIS — S0990XA Unspecified injury of head, initial encounter: Secondary | ICD-10-CM | POA: Diagnosis not present

## 2022-11-11 DIAGNOSIS — N320 Bladder-neck obstruction: Secondary | ICD-10-CM

## 2022-11-11 DIAGNOSIS — M549 Dorsalgia, unspecified: Secondary | ICD-10-CM | POA: Diagnosis present

## 2022-11-11 DIAGNOSIS — Y9301 Activity, walking, marching and hiking: Secondary | ICD-10-CM | POA: Diagnosis present

## 2022-11-11 DIAGNOSIS — W19XXXA Unspecified fall, initial encounter: Secondary | ICD-10-CM | POA: Diagnosis not present

## 2022-11-11 DIAGNOSIS — S52123A Displaced fracture of head of unspecified radius, initial encounter for closed fracture: Secondary | ICD-10-CM

## 2022-11-11 DIAGNOSIS — K760 Fatty (change of) liver, not elsewhere classified: Secondary | ICD-10-CM | POA: Diagnosis not present

## 2022-11-11 DIAGNOSIS — I959 Hypotension, unspecified: Secondary | ICD-10-CM | POA: Diagnosis not present

## 2022-11-11 DIAGNOSIS — Z79899 Other long term (current) drug therapy: Secondary | ICD-10-CM

## 2022-11-11 DIAGNOSIS — S22009A Unspecified fracture of unspecified thoracic vertebra, initial encounter for closed fracture: Secondary | ICD-10-CM

## 2022-11-11 DIAGNOSIS — Z6841 Body Mass Index (BMI) 40.0 and over, adult: Secondary | ICD-10-CM | POA: Diagnosis not present

## 2022-11-11 DIAGNOSIS — E785 Hyperlipidemia, unspecified: Secondary | ICD-10-CM | POA: Diagnosis present

## 2022-11-11 DIAGNOSIS — W19XXXD Unspecified fall, subsequent encounter: Secondary | ICD-10-CM | POA: Diagnosis not present

## 2022-11-11 DIAGNOSIS — R140 Abdominal distension (gaseous): Secondary | ICD-10-CM | POA: Diagnosis not present

## 2022-11-11 DIAGNOSIS — I6523 Occlusion and stenosis of bilateral carotid arteries: Secondary | ICD-10-CM | POA: Diagnosis not present

## 2022-11-11 DIAGNOSIS — M48061 Spinal stenosis, lumbar region without neurogenic claudication: Secondary | ICD-10-CM | POA: Diagnosis not present

## 2022-11-11 DIAGNOSIS — G8929 Other chronic pain: Secondary | ICD-10-CM | POA: Diagnosis present

## 2022-11-11 DIAGNOSIS — Z981 Arthrodesis status: Secondary | ICD-10-CM

## 2022-11-11 DIAGNOSIS — E1142 Type 2 diabetes mellitus with diabetic polyneuropathy: Secondary | ICD-10-CM | POA: Diagnosis not present

## 2022-11-11 LAB — CBC WITH DIFFERENTIAL/PLATELET
Abs Immature Granulocytes: 0.1 10*3/uL — ABNORMAL HIGH (ref 0.00–0.07)
Basophils Absolute: 0.1 10*3/uL (ref 0.0–0.1)
Basophils Relative: 0 %
Eosinophils Absolute: 0.2 10*3/uL (ref 0.0–0.5)
Eosinophils Relative: 1 %
HCT: 46.5 % (ref 39.0–52.0)
Hemoglobin: 15.9 g/dL (ref 13.0–17.0)
Immature Granulocytes: 1 %
Lymphocytes Relative: 18 %
Lymphs Abs: 2.2 10*3/uL (ref 0.7–4.0)
MCH: 29.9 pg (ref 26.0–34.0)
MCHC: 34.2 g/dL (ref 30.0–36.0)
MCV: 87.6 fL (ref 80.0–100.0)
Monocytes Absolute: 0.6 10*3/uL (ref 0.1–1.0)
Monocytes Relative: 5 %
Neutro Abs: 8.8 10*3/uL — ABNORMAL HIGH (ref 1.7–7.7)
Neutrophils Relative %: 75 %
Platelets: 202 10*3/uL (ref 150–400)
RBC: 5.31 MIL/uL (ref 4.22–5.81)
RDW: 12.9 % (ref 11.5–15.5)
WBC: 11.9 10*3/uL — ABNORMAL HIGH (ref 4.0–10.5)
nRBC: 0 % (ref 0.0–0.2)

## 2022-11-11 LAB — COMPREHENSIVE METABOLIC PANEL
ALT: 57 U/L — ABNORMAL HIGH (ref 0–44)
AST: 50 U/L — ABNORMAL HIGH (ref 15–41)
Albumin: 4.9 g/dL (ref 3.5–5.0)
Alkaline Phosphatase: 87 U/L (ref 38–126)
Anion gap: 16 — ABNORMAL HIGH (ref 5–15)
BUN: 30 mg/dL — ABNORMAL HIGH (ref 8–23)
CO2: 22 mmol/L (ref 22–32)
Calcium: 10.3 mg/dL (ref 8.9–10.3)
Chloride: 96 mmol/L — ABNORMAL LOW (ref 98–111)
Creatinine, Ser: 1.41 mg/dL — ABNORMAL HIGH (ref 0.61–1.24)
GFR, Estimated: 54 mL/min — ABNORMAL LOW (ref >60)
Glucose, Bld: 273 mg/dL — ABNORMAL HIGH (ref 70–99)
Potassium: 4.2 mmol/L (ref 3.5–5.1)
Sodium: 134 mmol/L — ABNORMAL LOW (ref 135–145)
Total Bilirubin: 1.1 mg/dL (ref 0.3–1.2)
Total Protein: 8.6 g/dL — ABNORMAL HIGH (ref 6.5–8.1)

## 2022-11-11 LAB — TYPE AND SCREEN
ABO/RH(D): B POS
Antibody Screen: NEGATIVE

## 2022-11-11 MED ORDER — FENTANYL CITRATE PF 50 MCG/ML IJ SOSY
100.0000 ug | PREFILLED_SYRINGE | Freq: Once | INTRAMUSCULAR | Status: AC
  Administered 2022-11-11: 100 ug via INTRAVENOUS
  Filled 2022-11-11: qty 2

## 2022-11-11 MED ORDER — SODIUM CHLORIDE 0.9 % IV BOLUS
1000.0000 mL | Freq: Once | INTRAVENOUS | Status: AC
  Administered 2022-11-11: 1000 mL via INTRAVENOUS

## 2022-11-11 MED ORDER — ACETAMINOPHEN 500 MG PO TABS
1000.0000 mg | ORAL_TABLET | Freq: Once | ORAL | Status: AC
  Administered 2022-11-11: 1000 mg via ORAL
  Filled 2022-11-11: qty 2

## 2022-11-11 MED ORDER — OXYCODONE HCL 5 MG PO TABS
5.0000 mg | ORAL_TABLET | Freq: Once | ORAL | Status: AC
  Administered 2022-11-11: 5 mg via ORAL
  Filled 2022-11-11: qty 1

## 2022-11-11 MED ORDER — IOHEXOL 300 MG/ML  SOLN
100.0000 mL | Freq: Once | INTRAMUSCULAR | Status: AC | PRN
  Administered 2022-11-11: 100 mL via INTRAVENOUS

## 2022-11-11 MED ORDER — KETOROLAC TROMETHAMINE 15 MG/ML IJ SOLN
15.0000 mg | Freq: Once | INTRAMUSCULAR | Status: AC
  Administered 2022-11-11: 15 mg via INTRAVENOUS
  Filled 2022-11-11: qty 1

## 2022-11-11 NOTE — ED Triage Notes (Signed)
Pt presents to triage via POV with complaints of a a crush injury. Pt was trying to load his riding lawn mower onto to the back of his truck when it rolled backwards and landed on his chest around 1900. Pt notes the lawn mower was on his chest for ~2-3 mins until his wife could come start the lawn mower and move it off of him. Pt notes hitting his head on the ground - no LOC. Endorses bilateral rib pain, L ankle, and R knee pain. Pt has has abrasions to his legs. A&Ox4 at this time. Denies SOB.

## 2022-11-11 NOTE — ED Notes (Signed)
Patient transported to CT 

## 2022-11-11 NOTE — ED Provider Notes (Addendum)
[facility] Provider Note    Event Date/Time   First MD Initiated Contact with Patient 11/11/22 2129     (approximate)   History   Trauma   HPI  Alexander Wilcox is a 72 y.o. male   Past medical history of arthritis, diabetes, hypertension, prostate cancer and sleep apnea and chronic back pain presents to the emergency department with trauma.  He was putting his riding lawnmower away when it fell backwards onto him landing on his left hip and torso.  He hit the back of his head.  He did not lose consciousness and he does not use blood thinners.  He was able to ambulate gingerly and came in a personal vehicle.  He has pain to his abdomen chest wall headache and left elbow, left ankle and right knee  Independent Historian contributed to assessment above: His wife who is at bedside corroborates information given above       Physical Exam   Triage Vital Signs: ED Triage Vitals  Enc Vitals Group     BP 11/11/22 2116 124/69     Pulse Rate 11/11/22 2116 (!) 102     Resp 11/11/22 2116 18     Temp 11/11/22 2116 97.7 F (36.5 C)     Temp Source 11/11/22 2116 Oral     SpO2 11/11/22 2116 100 %     Weight 11/11/22 2117 264 lb 1.8 oz (119.8 kg)     Height 11/11/22 2117 5\' 6"  (1.676 m)     Head Circumference --      Peak Flow --      Pain Score 11/11/22 2117 10     Pain Loc --      Pain Edu? --      Excl. in GC? --     Most recent vital signs: Vitals:   11/11/22 2217 11/11/22 2330  BP:  (!) 141/74  Pulse: 89 93  Resp: 15 16  Temp:    SpO2: 95% 93%    General: Awake, no distress.  CV:  Good peripheral perfusion.  Resp:  Normal effort.  Abd:  No distention.  Other:  Distended abdomen diffusely tender to palpation, lung sounds clear to auscultation bilaterally.  Full secondary survey reveals tenderness to left elbow, and a E fast was negative   ED Results / Procedures / Treatments   Labs (all labs ordered are listed, but only abnormal  results are displayed) Labs Reviewed  COMPREHENSIVE METABOLIC PANEL - Abnormal; Notable for the following components:      Result Value   Sodium 134 (*)    Chloride 96 (*)    Glucose, Bld 273 (*)    BUN 30 (*)    Creatinine, Ser 1.41 (*)    Total Protein 8.6 (*)    AST 50 (*)    ALT 57 (*)    GFR, Estimated 54 (*)    Anion gap 16 (*)    All other components within normal limits  CBC WITH DIFFERENTIAL/PLATELET - Abnormal; Notable for the following components:   WBC 11.9 (*)    Neutro Abs 8.8 (*)    Abs Immature Granulocytes 0.10 (*)    All other components within normal limits  TYPE AND SCREEN     I ordered and reviewed the above labs they are notable for white blood cell count mildly elevated 11.9, H&H is normal.   RADIOLOGY I independently reviewed and interpreted CT scan of the head and see no obvious bleeding  or midline shift   PROCEDURES:  Critical Care performed: Yes, see critical care procedure note(s)  .Critical Care  Performed by: Pilar Jarvis, MD Authorized by: Pilar Jarvis, MD   Critical care provider statement:    Critical care time (minutes):  30   Critical care was necessary to treat or prevent imminent or life-threatening deterioration of the following conditions:  Trauma   Critical care was time spent personally by me on the following activities:  Development of treatment plan with patient or surrogate, discussions with consultants, evaluation of patient's response to treatment, examination of patient, ordering and review of laboratory studies, ordering and review of radiographic studies, ordering and performing treatments and interventions, pulse oximetry, re-evaluation of patient's condition and review of old charts    MEDICATIONS ORDERED IN ED: Medications  sodium chloride 0.9 % bolus 1,000 mL (0 mLs Intravenous Stopped 11/11/22 2336)  fentaNYL (SUBLIMAZE) injection 100 mcg (100 mcg Intravenous Given 11/11/22 2142)  iohexol (OMNIPAQUE) 300 MG/ML solution  100 mL (100 mLs Intravenous Contrast Given 11/11/22 2159)  fentaNYL (SUBLIMAZE) injection 100 mcg (100 mcg Intravenous Given 11/11/22 2251)  oxyCODONE (Oxy IR/ROXICODONE) immediate release tablet 5 mg (5 mg Oral Given 11/11/22 2332)  acetaminophen (TYLENOL) tablet 1,000 mg (1,000 mg Oral Given 11/11/22 2331)  ketorolac (TORADOL) 15 MG/ML injection 15 mg (15 mg Intravenous Given 11/11/22 2332)    IMPRESSION / MDM / ASSESSMENT AND PLAN / ED COURSE  I reviewed the triage vital signs and the nursing notes.                                Patient's presentation is most consistent with acute presentation with potential threat to life or bodily function.  Differential diagnosis includes, but is not limited to, traumatic injury including intracranial bleeding, C-spine fracture dislocation, spinal fracture dislocation, internal bleeding   The patient is on the cardiac monitor to evaluate for evidence of arrhythmia and/or significant heart rate changes.  MDM: Patient with major trauma here with normal vital signs fortunately E fast is negative on initial examination.  CT pan scan head neck chest abdomen pelvis as well as T-spine and L-spine.  Extremity films including left elbow, left ankle and right knee.   Labs unremarkable so far his pending CMP, and fortunately his trauma pan scan shows no emergent pathologies on head neck CT scans.  He does have a questionable radial head fracture on the left side nondisplaced and an area of tenderness.  This will be splinted and immobilized and he will follow-up with orthopedics.     There appears to be a T-spine fracture on his CT imaging.  He has significant pain in this area.  He has no focal neurologic deficits like motor weakness or sensory deficits or incontinence suggest spinal cord pathologies.  Oncoming night provider will consult with neurosurgery regarding the spinal findings for disposition and planning.   FINAL CLINICAL IMPRESSION(S) / ED DIAGNOSES    Final diagnoses:  Blunt trauma  Closed nondisplaced fracture of head of left radius, initial encounter  Abdominal injury, initial encounter  Closed fracture of thoracic vertebra, unspecified fracture morphology, unspecified thoracic vertebral level, initial encounter (HCC)     Rx / DC Orders   ED Discharge Orders     None        Note:  This document was prepared using Dragon voice recognition software and may include unintentional dictation errors.    Pilar Jarvis, MD  11/11/22 2306    Pilar Jarvis, MD 11/12/22 [phone]

## 2022-11-12 ENCOUNTER — Emergency Department: Payer: Medicare HMO

## 2022-11-12 ENCOUNTER — Inpatient Hospital Stay: Payer: Medicare HMO | Admitting: Anesthesiology

## 2022-11-12 ENCOUNTER — Other Ambulatory Visit: Payer: Self-pay

## 2022-11-12 ENCOUNTER — Encounter: Payer: Self-pay | Admitting: Internal Medicine

## 2022-11-12 ENCOUNTER — Inpatient Hospital Stay: Payer: Medicare HMO

## 2022-11-12 ENCOUNTER — Encounter
Admission: EM | Disposition: A | Discharge status: Skilled Nursing Facility | Payer: Self-pay | Source: Home / Self Care | Attending: Internal Medicine

## 2022-11-12 DIAGNOSIS — Z6841 Body Mass Index (BMI) 40.0 and over, adult: Secondary | ICD-10-CM | POA: Diagnosis not present

## 2022-11-12 DIAGNOSIS — N179 Acute kidney failure, unspecified: Secondary | ICD-10-CM | POA: Diagnosis not present

## 2022-11-12 DIAGNOSIS — C61 Malignant neoplasm of prostate: Secondary | ICD-10-CM | POA: Diagnosis present

## 2022-11-12 DIAGNOSIS — N320 Bladder-neck obstruction: Secondary | ICD-10-CM

## 2022-11-12 DIAGNOSIS — G4733 Obstructive sleep apnea (adult) (pediatric): Secondary | ICD-10-CM | POA: Diagnosis present

## 2022-11-12 DIAGNOSIS — S22008S Other fracture of unspecified thoracic vertebra, sequela: Secondary | ICD-10-CM

## 2022-11-12 DIAGNOSIS — M4815 Ankylosing hyperostosis [Forestier], thoracolumbar region: Secondary | ICD-10-CM | POA: Diagnosis not present

## 2022-11-12 DIAGNOSIS — M532X4 Spinal instabilities, thoracic region: Secondary | ICD-10-CM

## 2022-11-12 DIAGNOSIS — S52121S Displaced fracture of head of right radius, sequela: Secondary | ICD-10-CM

## 2022-11-12 DIAGNOSIS — R29810 Facial weakness: Secondary | ICD-10-CM

## 2022-11-12 DIAGNOSIS — I10 Essential (primary) hypertension: Secondary | ICD-10-CM

## 2022-11-12 DIAGNOSIS — T1490XA Injury, unspecified, initial encounter: Secondary | ICD-10-CM | POA: Diagnosis not present

## 2022-11-12 DIAGNOSIS — Z794 Long term (current) use of insulin: Secondary | ICD-10-CM | POA: Diagnosis not present

## 2022-11-12 DIAGNOSIS — R7401 Elevation of levels of liver transaminase levels: Secondary | ICD-10-CM | POA: Diagnosis present

## 2022-11-12 DIAGNOSIS — R0781 Pleurodynia: Secondary | ICD-10-CM | POA: Diagnosis not present

## 2022-11-12 DIAGNOSIS — S52123A Displaced fracture of head of unspecified radius, initial encounter for closed fracture: Secondary | ICD-10-CM

## 2022-11-12 DIAGNOSIS — S22009A Unspecified fracture of unspecified thoracic vertebra, initial encounter for closed fracture: Secondary | ICD-10-CM

## 2022-11-12 DIAGNOSIS — D72829 Elevated white blood cell count, unspecified: Secondary | ICD-10-CM

## 2022-11-12 DIAGNOSIS — S22070A Wedge compression fracture of T9-T10 vertebra, initial encounter for closed fracture: Secondary | ICD-10-CM | POA: Diagnosis not present

## 2022-11-12 DIAGNOSIS — S22078A Other fracture of T9-T10 vertebra, initial encounter for closed fracture: Secondary | ICD-10-CM | POA: Diagnosis not present

## 2022-11-12 DIAGNOSIS — W19XXXA Unspecified fall, initial encounter: Secondary | ICD-10-CM | POA: Diagnosis not present

## 2022-11-12 DIAGNOSIS — E1165 Type 2 diabetes mellitus with hyperglycemia: Secondary | ICD-10-CM | POA: Diagnosis present

## 2022-11-12 DIAGNOSIS — M549 Dorsalgia, unspecified: Secondary | ICD-10-CM | POA: Diagnosis present

## 2022-11-12 DIAGNOSIS — M4324 Fusion of spine, thoracic region: Secondary | ICD-10-CM | POA: Diagnosis not present

## 2022-11-12 DIAGNOSIS — K567 Ileus, unspecified: Secondary | ICD-10-CM | POA: Diagnosis not present

## 2022-11-12 DIAGNOSIS — Y9301 Activity, walking, marching and hiking: Secondary | ICD-10-CM | POA: Diagnosis present

## 2022-11-12 DIAGNOSIS — R319 Hematuria, unspecified: Secondary | ICD-10-CM | POA: Diagnosis not present

## 2022-11-12 DIAGNOSIS — M25522 Pain in left elbow: Secondary | ICD-10-CM | POA: Diagnosis not present

## 2022-11-12 DIAGNOSIS — E871 Hypo-osmolality and hyponatremia: Secondary | ICD-10-CM | POA: Diagnosis present

## 2022-11-12 DIAGNOSIS — M1990 Unspecified osteoarthritis, unspecified site: Secondary | ICD-10-CM | POA: Diagnosis present

## 2022-11-12 DIAGNOSIS — Y92017 Garden or yard in single-family (private) house as the place of occurrence of the external cause: Secondary | ICD-10-CM | POA: Diagnosis not present

## 2022-11-12 DIAGNOSIS — G8929 Other chronic pain: Secondary | ICD-10-CM | POA: Insufficient documentation

## 2022-11-12 DIAGNOSIS — W1839XA Other fall on same level, initial encounter: Secondary | ICD-10-CM | POA: Diagnosis present

## 2022-11-12 DIAGNOSIS — S22079A Unspecified fracture of T9-T10 vertebra, initial encounter for closed fracture: Secondary | ICD-10-CM | POA: Diagnosis present

## 2022-11-12 DIAGNOSIS — S22008A Other fracture of unspecified thoracic vertebra, initial encounter for closed fracture: Secondary | ICD-10-CM

## 2022-11-12 DIAGNOSIS — I6523 Occlusion and stenosis of bilateral carotid arteries: Secondary | ICD-10-CM | POA: Diagnosis not present

## 2022-11-12 DIAGNOSIS — E1140 Type 2 diabetes mellitus with diabetic neuropathy, unspecified: Secondary | ICD-10-CM | POA: Diagnosis present

## 2022-11-12 DIAGNOSIS — R4781 Slurred speech: Secondary | ICD-10-CM | POA: Diagnosis not present

## 2022-11-12 DIAGNOSIS — E785 Hyperlipidemia, unspecified: Secondary | ICD-10-CM | POA: Diagnosis present

## 2022-11-12 HISTORY — DX: Bladder-neck obstruction: N32.0

## 2022-11-12 HISTORY — PX: POSTERIOR FUSION THORACIC SPINE: SUR1045

## 2022-11-12 HISTORY — PX: CYSTOSCOPY: SHX5120

## 2022-11-12 LAB — COMPREHENSIVE METABOLIC PANEL
ALT: 44 U/L (ref 0–44)
AST: 39 U/L (ref 15–41)
Albumin: 4.2 g/dL (ref 3.5–5.0)
Alkaline Phosphatase: 73 U/L (ref 38–126)
Anion gap: 11 (ref 5–15)
BUN: 35 mg/dL — ABNORMAL HIGH (ref 8–23)
CO2: 23 mmol/L (ref 22–32)
Calcium: 9.3 mg/dL (ref 8.9–10.3)
Chloride: 100 mmol/L (ref 98–111)
Creatinine, Ser: 1.42 mg/dL — ABNORMAL HIGH (ref 0.61–1.24)
GFR, Estimated: 53 mL/min — ABNORMAL LOW (ref >60)
Glucose, Bld: 169 mg/dL — ABNORMAL HIGH (ref 70–99)
Potassium: 3.8 mmol/L (ref 3.5–5.1)
Sodium: 134 mmol/L — ABNORMAL LOW (ref 135–145)
Total Bilirubin: 0.9 mg/dL (ref 0.3–1.2)
Total Protein: 7.1 g/dL (ref 6.5–8.1)

## 2022-11-12 LAB — CBC
HCT: 40.4 % (ref 39.0–52.0)
Hemoglobin: 13.9 g/dL (ref 13.0–17.0)
MCH: 30 pg (ref 26.0–34.0)
MCHC: 34.4 g/dL (ref 30.0–36.0)
MCV: 87.3 fL (ref 80.0–100.0)
Platelets: 161 10*3/uL (ref 150–400)
RBC: 4.63 MIL/uL (ref 4.22–5.81)
RDW: 13 % (ref 11.5–15.5)
WBC: 7.1 10*3/uL (ref 4.0–10.5)
nRBC: 0 % (ref 0.0–0.2)

## 2022-11-12 LAB — PROTIME-INR
INR: 1.1 (ref 0.8–1.2)
Prothrombin Time: 14.5 seconds (ref 11.4–15.2)

## 2022-11-12 LAB — GLUCOSE, CAPILLARY
Glucose-Capillary: 144 mg/dL — ABNORMAL HIGH (ref 70–99)
Glucose-Capillary: 158 mg/dL — ABNORMAL HIGH (ref 70–99)
Glucose-Capillary: 173 mg/dL — ABNORMAL HIGH (ref 70–99)
Glucose-Capillary: 234 mg/dL — ABNORMAL HIGH (ref 70–99)
Glucose-Capillary: 251 mg/dL — ABNORMAL HIGH (ref 70–99)
Glucose-Capillary: 265 mg/dL — ABNORMAL HIGH (ref 70–99)

## 2022-11-12 LAB — HEMOGLOBIN A1C
Hgb A1c MFr Bld: 7.5 % — ABNORMAL HIGH (ref 4.8–5.6)
Mean Plasma Glucose: 168.55 mg/dL

## 2022-11-12 LAB — HIV ANTIBODY (ROUTINE TESTING W REFLEX): HIV Screen 4th Generation wRfx: NONREACTIVE

## 2022-11-12 SURGERY — POSTERIOR THORACIC FUSION 4 LEVELS
Anesthesia: General | Site: Bladder

## 2022-11-12 MED ORDER — FENTANYL CITRATE (PF) 100 MCG/2ML IJ SOLN
INTRAMUSCULAR | Status: AC
  Filled 2022-11-12: qty 2

## 2022-11-12 MED ORDER — FENOFIBRATE 160 MG PO TABS
160.0000 mg | ORAL_TABLET | Freq: Every day | ORAL | Status: DC
  Administered 2022-11-13 – 2022-11-18 (×6): 160 mg via ORAL
  Filled 2022-11-12 (×6): qty 1

## 2022-11-12 MED ORDER — EPINEPHRINE PF 1 MG/ML IJ SOLN
INTRAMUSCULAR | Status: AC
  Filled 2022-11-12: qty 1

## 2022-11-12 MED ORDER — REMIFENTANIL HCL 1 MG IV SOLR
INTRAVENOUS | Status: AC
  Filled 2022-11-12: qty 1000

## 2022-11-12 MED ORDER — HYDROMORPHONE HCL 1 MG/ML IJ SOLN
INTRAMUSCULAR | Status: DC | PRN
  Administered 2022-11-12 (×4): .5 mg via INTRAVENOUS

## 2022-11-12 MED ORDER — VANCOMYCIN HCL 1000 MG IV SOLR
INTRAVENOUS | Status: DC | PRN
  Administered 2022-11-12: 1500 mg via INTRAVENOUS

## 2022-11-12 MED ORDER — SODIUM CHLORIDE 0.9 % IV SOLN: INTRAVENOUS | Status: DC

## 2022-11-12 MED ORDER — INSULIN ASPART 100 UNIT/ML IJ SOLN
0.0000 [IU] | INTRAMUSCULAR | Status: DC
  Administered 2022-11-12: 4 [IU] via SUBCUTANEOUS
  Administered 2022-11-12: 7 [IU] via SUBCUTANEOUS
  Administered 2022-11-12: 11 [IU] via SUBCUTANEOUS
  Administered 2022-11-12: 3 [IU] via SUBCUTANEOUS
  Administered 2022-11-13: 4 [IU] via SUBCUTANEOUS
  Administered 2022-11-13 (×3): 7 [IU] via SUBCUTANEOUS
  Administered 2022-11-13 – 2022-11-18 (×29): 4 [IU] via SUBCUTANEOUS
  Filled 2022-11-12 (×37): qty 1

## 2022-11-12 MED ORDER — PHENYLEPHRINE HCL-NACL 20-0.9 MG/250ML-% IV SOLN
INTRAVENOUS | Status: AC
  Filled 2022-11-12: qty 250

## 2022-11-12 MED ORDER — INSULIN GLARGINE-YFGN 100 UNIT/ML ~~LOC~~ SOLN
10.0000 [IU] | Freq: Every day | SUBCUTANEOUS | Status: DC
  Administered 2022-11-12 – 2022-11-17 (×6): 10 [IU] via SUBCUTANEOUS
  Filled 2022-11-12 (×7): qty 0.1

## 2022-11-12 MED ORDER — SEVOFLURANE IN SOLN
RESPIRATORY_TRACT | Status: AC
  Filled 2022-11-12: qty 250

## 2022-11-12 MED ORDER — REMIFENTANIL HCL 1 MG IV SOLR
INTRAVENOUS | Status: DC | PRN
  Administered 2022-11-12: .15 ug/kg/min via INTRAVENOUS

## 2022-11-12 MED ORDER — NORTRIPTYLINE HCL 25 MG PO CAPS
50.0000 mg | ORAL_CAPSULE | Freq: Every day | ORAL | Status: DC
  Administered 2022-11-12 – 2022-11-17 (×6): 50 mg via ORAL
  Filled 2022-11-12 (×6): qty 2

## 2022-11-12 MED ORDER — PROPOFOL 10 MG/ML IV BOLUS
INTRAVENOUS | Status: AC
  Filled 2022-11-12: qty 20

## 2022-11-12 MED ORDER — HYDROMORPHONE HCL 1 MG/ML IJ SOLN
INTRAMUSCULAR | Status: AC
  Filled 2022-11-12: qty 1

## 2022-11-12 MED ORDER — DEXAMETHASONE SODIUM PHOSPHATE 10 MG/ML IJ SOLN
INTRAMUSCULAR | Status: DC | PRN
  Administered 2022-11-12: 10 mg via INTRAVENOUS

## 2022-11-12 MED ORDER — CEFAZOLIN SODIUM-DEXTROSE 2-3 GM-%(50ML) IV SOLR
INTRAVENOUS | Status: DC | PRN
  Administered 2022-11-12: 1 g via INTRAVENOUS
  Administered 2022-11-12: 2 g via INTRAVENOUS

## 2022-11-12 MED ORDER — PROPOFOL 10 MG/ML IV BOLUS
INTRAVENOUS | Status: DC | PRN
  Administered 2022-11-12: 70 mg via INTRAVENOUS
  Administered 2022-11-12: 200 mg via INTRAVENOUS

## 2022-11-12 MED ORDER — LIDOCAINE HCL (CARDIAC) PF 100 MG/5ML IV SOSY
PREFILLED_SYRINGE | INTRAVENOUS | Status: DC | PRN
  Administered 2022-11-12: 100 mg via INTRAVENOUS

## 2022-11-12 MED ORDER — SODIUM CHLORIDE 0.9 % IR SOLN
Status: DC | PRN
  Administered 2022-11-12: 3000 mL

## 2022-11-12 MED ORDER — ACETAMINOPHEN 10 MG/ML IV SOLN
INTRAVENOUS | Status: AC
  Filled 2022-11-12: qty 100

## 2022-11-12 MED ORDER — VITAMIN B-12 1000 MCG PO TABS
1000.0000 ug | ORAL_TABLET | Freq: Every day | ORAL | Status: DC
  Administered 2022-11-13 – 2022-11-18 (×6): 1000 ug via ORAL
  Filled 2022-11-12 (×6): qty 1

## 2022-11-12 MED ORDER — VANCOMYCIN HCL IN DEXTROSE 1-5 GM/200ML-% IV SOLN
INTRAVENOUS | Status: AC
  Filled 2022-11-12: qty 200

## 2022-11-12 MED ORDER — OXYCODONE HCL 5 MG PO TABS
ORAL_TABLET | ORAL | Status: AC
  Filled 2022-11-12: qty 1

## 2022-11-12 MED ORDER — ACETAMINOPHEN 10 MG/ML IV SOLN
1000.0000 mg | Freq: Once | INTRAVENOUS | Status: DC | PRN
  Administered 2022-11-12: 1000 mg via INTRAVENOUS

## 2022-11-12 MED ORDER — IOHEXOL 350 MG/ML SOLN
75.0000 mL | Freq: Once | INTRAVENOUS | Status: AC | PRN
  Administered 2022-11-12: 75 mL via INTRAVENOUS

## 2022-11-12 MED ORDER — ONDANSETRON HCL 4 MG/2ML IJ SOLN: 4.0000 mg | Freq: Once | INTRAMUSCULAR | Status: DC | PRN

## 2022-11-12 MED ORDER — TAMSULOSIN HCL 0.4 MG PO CAPS
0.4000 mg | ORAL_CAPSULE | Freq: Every day | ORAL | Status: DC
  Administered 2022-11-13 – 2022-11-18 (×6): 0.4 mg via ORAL
  Filled 2022-11-12 (×6): qty 1

## 2022-11-12 MED ORDER — PROPOFOL 1000 MG/100ML IV EMUL
INTRAVENOUS | Status: AC
  Filled 2022-11-12: qty 100

## 2022-11-12 MED ORDER — DIPHENHYDRAMINE HCL 25 MG PO CAPS
25.0000 mg | ORAL_CAPSULE | Freq: Three times a day (TID) | ORAL | Status: DC | PRN
  Administered 2022-11-12 – 2022-11-13 (×2): 25 mg via ORAL
  Filled 2022-11-12 (×2): qty 1

## 2022-11-12 MED ORDER — METHOCARBAMOL 500 MG PO TABS
750.0000 mg | ORAL_TABLET | Freq: Four times a day (QID) | ORAL | Status: DC | PRN
  Administered 2022-11-16 – 2022-11-18 (×3): 750 mg via ORAL
  Filled 2022-11-12 (×3): qty 2

## 2022-11-12 MED ORDER — ONDANSETRON HCL 4 MG/2ML IJ SOLN
4.0000 mg | Freq: Four times a day (QID) | INTRAMUSCULAR | Status: DC | PRN
  Administered 2022-11-13: 4 mg via INTRAVENOUS
  Filled 2022-11-12: qty 2

## 2022-11-12 MED ORDER — HYDROMORPHONE HCL 1 MG/ML IJ SOLN
0.5000 mg | INTRAMUSCULAR | Status: DC | PRN
  Administered 2022-11-12 – 2022-11-16 (×13): 1 mg via INTRAVENOUS
  Filled 2022-11-12 (×14): qty 1

## 2022-11-12 MED ORDER — MIDAZOLAM HCL 2 MG/2ML IJ SOLN
INTRAMUSCULAR | Status: AC
  Filled 2022-11-12: qty 2

## 2022-11-12 MED ORDER — METHOCARBAMOL 500 MG PO TABS
500.0000 mg | ORAL_TABLET | ORAL | Status: AC
  Administered 2022-11-12: 500 mg via ORAL

## 2022-11-12 MED ORDER — PHENYLEPHRINE 80 MCG/ML (10ML) SYRINGE FOR IV PUSH (FOR BLOOD PRESSURE SUPPORT)
PREFILLED_SYRINGE | INTRAVENOUS | Status: DC | PRN
  Administered 2022-11-12 (×4): 160 ug via INTRAVENOUS

## 2022-11-12 MED ORDER — PANTOPRAZOLE SODIUM 40 MG PO TBEC
40.0000 mg | DELAYED_RELEASE_TABLET | Freq: Every day | ORAL | Status: DC
  Administered 2022-11-12 – 2022-11-18 (×7): 40 mg via ORAL
  Filled 2022-11-12 (×7): qty 1

## 2022-11-12 MED ORDER — ATORVASTATIN CALCIUM 20 MG PO TABS
40.0000 mg | ORAL_TABLET | Freq: Every day | ORAL | Status: DC
  Administered 2022-11-13 – 2022-11-18 (×6): 40 mg via ORAL
  Filled 2022-11-12 (×6): qty 2

## 2022-11-12 MED ORDER — BUPIVACAINE HCL (PF) 0.5 % IJ SOLN
INTRAMUSCULAR | Status: AC
  Filled 2022-11-12: qty 60

## 2022-11-12 MED ORDER — OXYCODONE HCL 5 MG/5ML PO SOLN: 5.0000 mg | Freq: Once | ORAL | Status: AC | PRN

## 2022-11-12 MED ORDER — DIPHENHYDRAMINE HCL 50 MG/ML IJ SOLN
INTRAMUSCULAR | Status: DC | PRN
  Administered 2022-11-12: 12.5 mg via INTRAVENOUS

## 2022-11-12 MED ORDER — SODIUM CHLORIDE FLUSH 0.9 % IV SOLN
INTRAVENOUS | Status: AC
  Filled 2022-11-12: qty 10

## 2022-11-12 MED ORDER — SODIUM CHLORIDE (PF) 0.9 % IJ SOLN
INTRAMUSCULAR | Status: DC | PRN
  Administered 2022-11-12: 60 mL via INTRAMUSCULAR

## 2022-11-12 MED ORDER — OXYCODONE HCL 5 MG PO TABS
5.0000 mg | ORAL_TABLET | Freq: Once | ORAL | Status: AC | PRN
  Administered 2022-11-12: 5 mg via ORAL

## 2022-11-12 MED ORDER — ASPIRIN 81 MG PO TBEC
81.0000 mg | DELAYED_RELEASE_TABLET | Freq: Every day | ORAL | Status: DC
  Administered 2022-11-12 – 2022-11-18 (×7): 81 mg via ORAL
  Filled 2022-11-12 (×7): qty 1

## 2022-11-12 MED ORDER — FENTANYL CITRATE (PF) 100 MCG/2ML IJ SOLN
25.0000 ug | INTRAMUSCULAR | Status: DC | PRN
  Administered 2022-11-12 (×3): 25 ug via INTRAVENOUS
  Administered 2022-11-12: 50 ug via INTRAVENOUS
  Administered 2022-11-12: 25 ug via INTRAVENOUS

## 2022-11-12 MED ORDER — OXYCODONE HCL 5 MG PO TABS
5.0000 mg | ORAL_TABLET | ORAL | Status: DC | PRN
  Administered 2022-11-12 – 2022-11-16 (×14): 5 mg via ORAL
  Filled 2022-11-12 (×14): qty 1

## 2022-11-12 MED ORDER — METOPROLOL SUCCINATE ER 25 MG PO TB24
25.0000 mg | ORAL_TABLET | Freq: Two times a day (BID) | ORAL | Status: DC
  Administered 2022-11-12: 25 mg via ORAL
  Filled 2022-11-12: qty 1

## 2022-11-12 MED ORDER — HYDRALAZINE HCL 20 MG/ML IJ SOLN: 5.0000 mg | Freq: Four times a day (QID) | INTRAMUSCULAR | Status: DC | PRN

## 2022-11-12 MED ORDER — GLYCOPYRROLATE 0.2 MG/ML IJ SOLN
INTRAMUSCULAR | Status: DC | PRN
  Administered 2022-11-12: .2 mg via INTRAVENOUS

## 2022-11-12 MED ORDER — CEFAZOLIN SODIUM-DEXTROSE 1-4 GM/50ML-% IV SOLN
INTRAVENOUS | Status: AC
  Filled 2022-11-12: qty 50

## 2022-11-12 MED ORDER — FENTANYL CITRATE (PF) 100 MCG/2ML IJ SOLN
INTRAMUSCULAR | Status: DC | PRN
  Administered 2022-11-12 (×2): 50 ug via INTRAVENOUS

## 2022-11-12 MED ORDER — METHOCARBAMOL 500 MG PO TABS
ORAL_TABLET | ORAL | Status: AC
  Filled 2022-11-12: qty 1

## 2022-11-12 MED ORDER — SUCCINYLCHOLINE CHLORIDE 200 MG/10ML IV SOSY
PREFILLED_SYRINGE | INTRAVENOUS | Status: DC | PRN
  Administered 2022-11-12: 120 mg via INTRAVENOUS

## 2022-11-12 MED ORDER — INSULIN ASPART 100 UNIT/ML IJ SOLN
5.0000 [IU] | INTRAMUSCULAR | Status: AC
  Administered 2022-11-12: 5 [IU] via SUBCUTANEOUS

## 2022-11-12 MED ORDER — SODIUM CHLORIDE 0.9 % IR SOLN
Status: DC | PRN
  Administered 2022-11-12: 500 mL

## 2022-11-12 MED ORDER — ONDANSETRON HCL 4 MG PO TABS: 4.0000 mg | ORAL_TABLET | Freq: Four times a day (QID) | ORAL | Status: DC | PRN

## 2022-11-12 MED ORDER — INSULIN ASPART 100 UNIT/ML IJ SOLN
INTRAMUSCULAR | Status: AC
  Filled 2022-11-12: qty 1

## 2022-11-12 MED ORDER — SODIUM CHLORIDE 0.9 % IV SOLN: INTRAVENOUS | Status: DC | PRN

## 2022-11-12 MED ORDER — CEFAZOLIN SODIUM-DEXTROSE 2-4 GM/100ML-% IV SOLN
INTRAVENOUS | Status: AC
  Filled 2022-11-12: qty 100

## 2022-11-12 MED ORDER — BUPIVACAINE LIPOSOME 1.3 % IJ SUSP
INTRAMUSCULAR | Status: AC
  Filled 2022-11-12: qty 20

## 2022-11-12 MED ORDER — GABAPENTIN 400 MG PO CAPS
800.0000 mg | ORAL_CAPSULE | Freq: Three times a day (TID) | ORAL | Status: DC
  Administered 2022-11-12 – 2022-11-18 (×19): 800 mg via ORAL
  Filled 2022-11-12 (×19): qty 2

## 2022-11-12 MED ORDER — DULOXETINE HCL 30 MG PO CPEP
30.0000 mg | ORAL_CAPSULE | Freq: Every day | ORAL | Status: DC
  Administered 2022-11-12 – 2022-11-18 (×7): 30 mg via ORAL
  Filled 2022-11-12 (×7): qty 1

## 2022-11-12 MED ORDER — BUPIVACAINE-EPINEPHRINE 0.5% -1:200000 IJ SOLN
INTRAMUSCULAR | Status: DC | PRN
  Administered 2022-11-12: 4 mg

## 2022-11-12 MED ORDER — PHENYLEPHRINE HCL-NACL 20-0.9 MG/250ML-% IV SOLN
INTRAVENOUS | Status: DC | PRN
  Administered 2022-11-12: 30 ug/min via INTRAVENOUS

## 2022-11-12 MED ORDER — EPHEDRINE SULFATE (PRESSORS) 50 MG/ML IJ SOLN
INTRAMUSCULAR | Status: DC | PRN
  Administered 2022-11-12 (×2): 5 mg via INTRAVENOUS
  Administered 2022-11-12: 10 mg via INTRAVENOUS
  Administered 2022-11-12 (×2): 5 mg via INTRAVENOUS
  Administered 2022-11-12: 10 mg via INTRAVENOUS

## 2022-11-12 MED ORDER — METOPROLOL SUCCINATE ER 25 MG PO TB24
25.0000 mg | ORAL_TABLET | Freq: Two times a day (BID) | ORAL | Status: DC
  Administered 2022-11-12 – 2022-11-18 (×12): 25 mg via ORAL
  Filled 2022-11-12 (×12): qty 1

## 2022-11-12 MED ORDER — LACTATED RINGERS IV SOLN: INTRAVENOUS | Status: DC

## 2022-11-12 MED ORDER — ACETAMINOPHEN 325 MG PO TABS: 650.0000 mg | ORAL_TABLET | Freq: Four times a day (QID) | ORAL | Status: DC | PRN

## 2022-11-12 MED ORDER — ONDANSETRON HCL 4 MG/2ML IJ SOLN
INTRAMUSCULAR | Status: DC | PRN
  Administered 2022-11-12 (×2): 4 mg via INTRAVENOUS

## 2022-11-12 MED ORDER — ACETAMINOPHEN 650 MG RE SUPP: 650.0000 mg | Freq: Four times a day (QID) | RECTAL | Status: DC | PRN

## 2022-11-12 MED ORDER — VASOPRESSIN 20 UNIT/ML IV SOLN
INTRAVENOUS | Status: DC | PRN
  Administered 2022-11-12 (×4): 1 [IU] via INTRAVENOUS

## 2022-11-12 SURGICAL SUPPLY — 67 items
ADH SKN CLS APL DERMABOND .7 (GAUZE/BANDAGES/DRESSINGS)
AGENT HMST KT MTR STRL THRMB (HEMOSTASIS) ×2
ALLOGRAFT BONE FIBER KORE 10CC (Bone Implant) IMPLANT
BASIN KIT SINGLE STR (MISCELLANEOUS) ×3 IMPLANT
BUR NEURO DRILL SOFT 3.0X3.8M (BURR) ×3 IMPLANT
CATH FOLEY 2W COUNCIL 5CC 16FR (CATHETERS) IMPLANT
CATH SET URETHRAL DILATOR (CATHETERS) IMPLANT
CNTNR URN SCR LID CUP LEK RST (MISCELLANEOUS) ×3 IMPLANT
CONT SPEC 4OZ STRL OR WHT (MISCELLANEOUS) ×2
CUP MEDICINE 2OZ PLAST GRAD ST (MISCELLANEOUS) ×3 IMPLANT
DERMABOND ADVANCED .7 DNX12 (GAUZE/BANDAGES/DRESSINGS) ×3 IMPLANT
DRAPE C ARM PK CFD 31 SPINE (DRAPES) ×3 IMPLANT
DRAPE LAPAROTOMY 100X77 ABD (DRAPES) ×3 IMPLANT
DRAPE MICROSCOPE SPINE 48X150 (DRAPES) ×3 IMPLANT
ELECT CAUTERY BLADE TIP 2.5 (TIP) ×4
ELECT EZSTD 165MM 6.5IN (MISCELLANEOUS)
ELECT REM PT RETURN 9FT ADLT (ELECTROSURGICAL) ×2
ELECTRODE CAUTERY BLDE TIP 2.5 (TIP) ×6 IMPLANT
ELECTRODE EZSTD 165MM 6.5IN (MISCELLANEOUS) IMPLANT
ELECTRODE REM PT RTRN 9FT ADLT (ELECTROSURGICAL) ×3 IMPLANT
GAUZE 4X4 16PLY ~~LOC~~+RFID DBL (SPONGE) IMPLANT
GLOVE BIOGEL PI IND STRL 6.5 (GLOVE) ×3 IMPLANT
GLOVE BIOGEL PI IND STRL 8.5 (GLOVE) ×3 IMPLANT
GLOVE SURG SYN 6.5 ES PF (GLOVE) ×4 IMPLANT
GLOVE SURG SYN 6.5 PF PI (GLOVE) ×6 IMPLANT
GLOVE SURG SYN 8.5  E (GLOVE) ×6
GLOVE SURG SYN 8.5 E (GLOVE) ×6 IMPLANT
GLOVE SURG SYN 8.5 PF PI (GLOVE) ×9 IMPLANT
GOWN SRG LRG LVL 4 IMPRV REINF (GOWNS) ×3 IMPLANT
GOWN SRG XL LVL 3 NONREINFORCE (GOWNS) ×3 IMPLANT
GOWN STRL NON-REIN TWL XL LVL3 (GOWNS) ×2
GOWN STRL REIN LRG LVL4 (GOWNS) ×2
GUIDEWIRE GREEN .038 145CM (MISCELLANEOUS) IMPLANT
GUIDEWIRE NITINOL BEVEL TIP (WIRE) IMPLANT
GUIDEWIRE STR DUAL SENSOR (WIRE) IMPLANT
HOLDER FOLEY CATH W/STRAP (MISCELLANEOUS) ×3 IMPLANT
IV NS IRRIG 3000ML ARTHROMATIC (IV SOLUTION) IMPLANT
JELLY LUBE 2OZ STRL (MISCELLANEOUS) IMPLANT
KIT PREVENA INCISION MGT 13 (CANNISTER) ×3 IMPLANT
KIT SPINAL PRONEVIEW (KITS) ×3 IMPLANT
MANIFOLD NEPTUNE II (INSTRUMENTS) ×3 IMPLANT
MARKER SKIN DUAL TIP RULER LAB (MISCELLANEOUS) ×3 IMPLANT
NDL SAFETY ECLIP 18X1.5 (MISCELLANEOUS) ×3 IMPLANT
NS IRRIG 1000ML POUR BTL (IV SOLUTION) ×3 IMPLANT
NS IRRIG 500ML POUR BTL (IV SOLUTION) IMPLANT
PACK LAMINECTOMY NEURO (CUSTOM PROCEDURE TRAY) ×3 IMPLANT
PAD ARMBOARD 7.5X6 YLW CONV (MISCELLANEOUS) ×3 IMPLANT
PREVENA INCISION MGT 90 150 (MISCELLANEOUS) IMPLANT
ROD STRAIGHT RELINE 5.5X500 (Rod) IMPLANT
SCREW LOCK RELINE 5.5 TULIP (Screw) IMPLANT
SCREW RELINE RED 6.5X45MM POLY (Screw) IMPLANT
SCREW RELINE RED 6.5X50MM POLY (Screw) IMPLANT
SOLUTION IRRIG SURGIPHOR (IV SOLUTION) ×3 IMPLANT
SURGIFLO W/THROMBIN 8M KIT (HEMOSTASIS) ×3 IMPLANT
SUT DVC VLOC 3-0 CL 6 P-12 (SUTURE) ×3 IMPLANT
SUT V-LOC 90 ABS DVC 3-0 CL (SUTURE) IMPLANT
SUT VIC AB 0 CT1 27 (SUTURE) ×6
SUT VIC AB 0 CT1 27XCR 8 STRN (SUTURE) ×6 IMPLANT
SUT VIC AB 2-0 CT1 18 (SUTURE) ×6 IMPLANT
SYR 10ML LL (SYRINGE) ×3 IMPLANT
SYR 30ML LL (SYRINGE) ×6 IMPLANT
TOWEL OR 17X26 4PK STRL BLUE (TOWEL DISPOSABLE) ×6 IMPLANT
TRAP FLUID SMOKE EVACUATOR (MISCELLANEOUS) ×3 IMPLANT
TRAY FOLEY MTR SLVR 16FR STAT (SET/KITS/TRAYS/PACK) IMPLANT
VALVE UROSEAL ADJ ENDO (VALVE) IMPLANT
WATER STERILE IRR 1000ML POUR (IV SOLUTION) ×6 IMPLANT
WIRE AMPLATZ SSTIFF .035X260CM (WIRE) IMPLANT

## 2022-11-12 NOTE — ED Notes (Signed)
Spoke with Tyson Babinski and ordered TLSO brace

## 2022-11-12 NOTE — Assessment & Plan Note (Addendum)
Reactive.  Improved

## 2022-11-12 NOTE — Assessment & Plan Note (Addendum)
Improved

## 2022-11-12 NOTE — Progress Notes (Signed)
Orthopedic Tech Progress Note Patient Details:  Alexander Wilcox [birth year] [id number]  Patient ID: Alexander Wilcox, male   DOB: 06-Jun-1951, 72 y.o.   MRN: [id number] Brace ordered. Al Decant 11/12/2022, 12:43 AM

## 2022-11-12 NOTE — Assessment & Plan Note (Addendum)
Ruled out by orthopedic surgery.  Cleared to work with physical therapy with his left arm.

## 2022-11-12 NOTE — Hospital Course (Signed)
72 y.o. male with medical history significant for HTN, insulin-dependent type 2 diabetes with neuropathy, OSA on CPAP, HLD, prostate cancer on leuprolide, failed back surgical syndrome, and cervical myelopathy s/p cervical fusion who presented to the ED for evaluation of acute back pain related to trauma when his riding lawn more rolled backwards off the trailer, knocking him down, landing on his torso and left hip and pinning him to the ground.  He hit his head but did not lose consciousness.  His wife had to power on to mower and drive it off of him.  He was able to get up and cautiously ambulate but he has been complaining of abdominal, chest wall, left elbow ankle and right knee pain.  He was previously in his usual state of health. ED course and data review: Vitals within normal limits.  Labs notable for hyperglycemia of 273 with anion gap 16 and bicarb 22., creatinine 1.41.  Mild transaminitis with AST 50 and ALT 57.  WBC 11,900 with otherwise normal CBC. Trauma imaging showed a fracture of T9 on CT imaging and a left radial head nondisplaced fracture.  No other injuries noted on pan scan The ED provider spoke with neurosurgeon, Dr. Marcell Barlow who will take patient to the OR on 5/10.  5/10.  Dr. Myer Haff did open reduction internal fixation of T9 fracture and posterolateral arthrodesis T7-T12.  Dr. Richardo Hanks urology placed a Foley catheter for bladder neck contracture requiring dilatation.  When I came from my evaluation patient's wife noticed a left facial droop and intermittent slurred speech.  Case discussed with neurology and a CT angio head and neck ordered.  Patient not a candidate for tPA since had major surgery today. 5/11.  MRI brain negative for stroke.  CT angio head and neck showed stenosis vertebral, internal carotid and basilar arteries.  Will continue aspirin.  Strength improved from yesterday for his left leg. 5/12.  Started meds for constipation. 5/13.  Added lactulose for constipation.   Patient unable to straight leg raise with left leg today.  Developed some hematuria overnight. 5/14.  Now having diarrhea.  Anticonstipation medications held.  Abdominal x-ray showing gaseous distention.  No vomiting so I doubt ileus.

## 2022-11-12 NOTE — Anesthesia Postprocedure Evaluation (Signed)
Anesthesia Post Note  Patient: NUBAID FEKETE  Procedure(s) Performed: POSTERIOR THORACIC FUSION T7-12 Cystoscopy, dilation of bladder neck contracture, complex Foley catheter placement (Bladder)  Patient location during evaluation: PACU Anesthesia Type: General Level of consciousness: awake and alert, oriented and patient cooperative Pain management: pain level controlled Vital Signs Assessment: post-procedure vital signs reviewed and stable Respiratory status: spontaneous breathing, nonlabored ventilation and respiratory function stable Cardiovascular status: blood pressure returned to baseline and stable Postop Assessment: adequate PO intake Anesthetic complications: yes   Encounter Notable Events  Notable Event Outcome Phase Comment  Difficult to intubate - expected  Intraprocedure Filed from anesthesia note documentation.     Last Vitals:  Vitals:   11/12/22 1230 11/12/22 1245  BP: (!) 165/52 (!) 166/58  Pulse: 88 86  Resp: 12 12  Temp: 36.7 C   SpO2: 92% 95%    Last Pain:  Vitals:   11/12/22 1230  TempSrc:   PainSc: 9                  Reed Breech

## 2022-11-12 NOTE — Anesthesia Preprocedure Evaluation (Addendum)
Anesthesia Evaluation  Patient identified by MRN, date of birth, ID band Patient awake    Reviewed: Allergy & Precautions, NPO status , Patient's Chart, lab work & pertinent test results  History of Anesthesia Complications Negative for: history of anesthetic complications  Airway Mallampati: IV   Neck ROM: Limited    Dental   Bridges :   Pulmonary sleep apnea , former smoker (quit 1998)   Pulmonary exam normal breath sounds clear to auscultation       Cardiovascular hypertension, Normal cardiovascular exam Rhythm:Regular Rate:Normal  Myocardial perfusion 04/02/20: Negative Lexiscan stress.  LV function normal.  No evidence of significant ischemia noted during stress images.  Follow-up as planned.  Low risk study.   Echo 04/02/20:  NORMAL LEFT VENTRICULAR SYSTOLIC FUNCTION   WITH MILD LVH  NORMAL RIGHT VENTRICULAR SYSTOLIC FUNCTION  TRIVIAL REGURGITATION NOTED  NO VALVULAR STENOSIS  Closest EF: >55% (Estimated)  LVH: MILD LVH  Mitral: TRIVIAL MR  Tricuspid: TRIVIAL TR     Neuro/Psych Ankylosing spondylitis  Neuromuscular disease (diabetic neuropathy)    GI/Hepatic ,GERD  Medicated,,  Endo/Other  diabetes, Type 2  Class 3 obesity  Renal/GU negative Renal ROS   Prostate CA    Musculoskeletal  (+) Arthritis ,    Abdominal   Peds  Hematology negative hematology ROS (+)   Anesthesia Other Findings Cardiology note 10/11/22:  Cardiovascular Problem List  -HTN -HLD -insulin dependent type II DM -OSA -long term current use of ASA -obesity -former tobacco use  Assessment and Plan  -doing well overall, stable functional ability, no si/sx of angina or HF -continue ASA, statin -continue current antiHTN Rx -ongoing mgmt of type II DM per PCP -mediterranean diet, aerobic exercise -f/u one yr    Reproductive/Obstetrics                             Anesthesia Physical Anesthesia  Plan  ASA: 3  Anesthesia Plan: General   Post-op Pain Management:    Induction: Intravenous  PONV Risk Score and Plan: 2 and Ondansetron, Dexamethasone and Treatment may vary due to age or medical condition  Airway Management Planned: Oral ETT  Additional Equipment:   Intra-op Plan:   Post-operative Plan: Extubation in OR  Informed Consent: I have reviewed the patients History and Physical, chart, labs and discussed the procedure including the risks, benefits and alternatives for the proposed anesthesia with the patient or authorized representative who has indicated his/her understanding and acceptance.     Dental advisory given  Plan Discussed with: CRNA  Anesthesia Plan Comments: (Patient consented for risks of anesthesia including but not limited to:  - adverse reactions to medications - damage to eyes, teeth, lips or other oral mucosa - nerve damage due to positioning  - sore throat or hoarseness - damage to heart, brain, nerves, lungs, other parts of body or loss of life  Informed patient about role of CRNA in peri- and intra-operative care.  Patient voiced understanding.)        Anesthesia Quick Evaluation

## 2022-11-12 NOTE — Assessment & Plan Note (Signed)
Transient facial droop likely secondary to trauma.  MRI negative for stroke.  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  Continue aspirin.

## 2022-11-12 NOTE — Op Note (Signed)
Indications: Mr. Blitch is a 72 year old gentleman with a baseline underlying diagnosis of diffuse idiopathic skeletal hyperostosis who suffered a traumatic injury yesterday.  He suffered a 3 column unstable injury at T9 that extends into the T9-10 disc space.  Due to the severe instability associated with this type of 3 column fracture, I recommended surgical fixation.  Findings: Open reduction internal fixation of T9 fracture  Preoperative Diagnosis: Unstable 3 column thoracic fracture at T9, thoracic spinal instability, DISH Postoperative Diagnosis: same   EBL: 70 ml IVF: see AR ml Drains: none Disposition: Extubated and Stable to PACU Complications: none  Urology consulted for foley placement   Preoperative Note:   Risks of surgery discussed include: infection, bleeding, stroke, coma, death, paralysis, CSF leak, nerve/spinal cord injury, numbness, tingling, weakness, complex regional pain syndrome, recurrent stenosis and/or disc herniation, vascular injury, development of instability, neck/back pain, need for further surgery, persistent symptoms, development of deformity, and the risks of anesthesia. The patient understood these risks and agreed to proceed.  Operative Note:  1.  Open reduction internal fixation of T9 fracture  2. Posterolateral arthrodesis T7 to T12 3. Posterior segmental instrumentation T7 to T12 using Nuvasive Reline 4. Use of stereotaxis ([employer])  The patient was brought to the Operating Room, intubated and turned into the prone position. All pressure points were checked and double checked.  X-ray was used to obtain reasonable anatomic alignment on the table.  The chest roll was placed inferiorly to allow for the unstable fracture to fall back into position.  The patient was prepped and draped in the standard fashion. A full timeout was performed. Preoperative antibiotics were given.   After draping, the stereotactic array was placed.  Stereotactic imaging was  acquired and registered to the [facility] system.  The image guidance system was used to confirm the top and bottom of the surgical incision.  A midline incision was then opened.  This was carried down and opened wide enough to allow for full exposure of the posterior elements.  Using the stereotactic guidance system, vaginal entry points were determined.  The dorsal fascia was then opened in continuous fashion from T7-T12 on both sides.  Using the stereotactic drill guide, the pedicles were cannulated to a depth of 35 mm from T7-T12 inclusive bilaterally.    We then used the stereotactic screwdriver to place a 6.5 x 50 mm screws from T7-T12 bilaterally with the exception of left T11, were 6.5 x 45 mm screw was placed.  The exposed posterior elements were prepared for arthrodesis.  We then placed rods on either side and reduced the screws to the rod to allow for open reduction and internal fixation of the T9 fracture.  By reducing the screws to the rods and secured them in position to manufacturer specifications, the T9 fracture was fully reduced.    The stereotactic C-arm was then brought back and to confirm placement of implants and appropriate alignment.  After this was done, the wound was copiously irrigated, then the posterior elements were prepared for arthrodesis.  Allograft was placed over the posterior elements from T7-12.   After hemostasis, the wound was closed in layers with 0 and 2-0 vicryl. 3-0 monocryl and a wound vac were applied to the incision.   The patient was then flipped supine and positioned for urology to consult for placement of foley catheter.  All counts were correct times 2 at the end of the case. No immediate complications were noted.  Manning Charity PA assisted in the  entire procedure. An assistant was required for this procedure due to the complexity.  The assistant provided assistance in tissue manipulation and suction, and was required for the successful and safe  performance of the procedure. I performed the critical portions of the procedure.   Venetia Night MD

## 2022-11-12 NOTE — Assessment & Plan Note (Signed)
On leuprolide every 6 months

## 2022-11-12 NOTE — Progress Notes (Signed)
Assessment on transfer shows a left droop to patient's face. Remainder of NIHSS is as expected post-surgery. Charge RN to bedside, Dr. Renae Gloss immediately at bedside to assess. Dr. Renae Gloss states he will consult neurology; do not call code stroke at this time.

## 2022-11-12 NOTE — Assessment & Plan Note (Addendum)
Diabetic neuropathy Hemoglobin A1c 7.5 basal insulin with sliding scale coverage Continue gabapentin, duloxetine.

## 2022-11-12 NOTE — Progress Notes (Signed)
Patient off the floor upon start of my shift. Will assess upon return to floor.

## 2022-11-12 NOTE — Anesthesia Procedure Notes (Signed)
Procedure Name: General with mask airway Date/Time: 11/12/2022 7:47 AM  Performed by: Mohammed Kindle, CRNAPre-anesthesia Checklist: Patient identified, Emergency Drugs available, Suction available and Patient being monitored Patient Re-evaluated:Patient Re-evaluated prior to induction Oxygen Delivery Method: Circle system utilized Preoxygenation: Pre-oxygenation with 100% oxygen Induction Type: IV induction, Cricoid Pressure applied and Rapid sequence Laryngoscope Size: McGraph and 4 Grade View: Grade IV Tube type: Oral Tube size: 7.5 mm Number of attempts: 1 Airway Equipment and Method: Stylet Placement Confirmation: ETT inserted through vocal cords under direct vision, positive ETCO2, CO2 detector and breath sounds checked- equal and bilateral Secured at: 22 cm Tube secured with: Tape Dental Injury: Teeth and Oropharynx as per pre-operative assessment  Difficulty Due To: Difficulty was anticipated, Difficult Airway- due to limited oral opening and Difficult Airway- due to reduced neck mobility Future Recommendations: Recommend- induction with short-acting agent, and alternative techniques readily available Comments: Atraumatic TFH CRNA

## 2022-11-12 NOTE — Assessment & Plan Note (Addendum)
Chronic back pain/history of lumbar laminectomy and cervical fusion Neurosurgery took urgently to the OR 5/10 secondary to thoracic instability.  Patient had a open reduction internal fixation of T9 fracture and posterolateral arthrodesis T7-T12.  Continue PT and OT evaluations.  Patient will need rehab.  Pain control.

## 2022-11-12 NOTE — Assessment & Plan Note (Addendum)
-   Continue Toprol 

## 2022-11-12 NOTE — ED Notes (Signed)
Pt taken to MRI  

## 2022-11-12 NOTE — Plan of Care (Addendum)
Patient A&Ox4, from home, up with assist in room. Arrived to unit around 3am for T9-T10 fracture after an accident at home. OR notified this RN they will be sending for patient around 6am for surgery.

## 2022-11-12 NOTE — Interval H&P Note (Signed)
History and Physical Interval Note:  11/12/2022 7:24 AM  Alexander Wilcox  has presented today for surgery, with the diagnosis of unstable 3 column fracture, T9 fracture.  The various methods of treatment have been discussed with the patient and family. After consideration of risks, benefits and other options for treatment, the patient has consented to  Procedure(s) with comments: POSTERIOR THORACIC FUSION 4 LEVELS (N/A) - T7-12 posterior fusion as a surgical intervention.  The patient's history has been reviewed, patient examined, no change in status, stable for surgery.  I have reviewed the patient's chart and labs.  Questions were answered to the patient's satisfaction.    Heart sounds normal no MRG. Chest Clear to Auscultation Bilaterally.  [name]

## 2022-11-12 NOTE — Plan of Care (Signed)
Patient A&Ox4, from home, SBA in room. Patient has c/o inability to move his left leg earlier in shift, but is able to move for this RN. MRI completed d/t concern of stroke, resulted negative. IV fluid maintained, pain medicated regularly. Foley also maintained.

## 2022-11-12 NOTE — Transfer of Care (Signed)
Immediate Anesthesia Transfer of Care Note  Patient: Alexander Wilcox  Procedure(s) Performed: POSTERIOR THORACIC FUSION T7-12  Patient Location: PACU  Anesthesia Type:General  Level of Consciousness: awake, drowsy, and patient cooperative  Airway & Oxygen Therapy: Patient Spontanous Breathing and Patient connected to face mask oxygen  Post-op Assessment: Report given to RN and Post -op Vital signs reviewed and stable  Post vital signs: Reviewed and stable  Last Vitals:  Vitals Value Taken Time  BP 175/67 11/12/22 1124  Temp    Pulse 91 11/12/22 1129  Resp 17 11/12/22 1129  SpO2 100 % 11/12/22 1129  Vitals shown include unvalidated device data.  Last Pain:  Vitals:   11/12/22 0627  TempSrc:   PainSc: 5       Patients Stated Pain Goal: 5 (11/12/22 0300)  Complications:  Encounter Notable Events  Notable Event Outcome Phase Comment  Difficult to intubate - expected  Intraprocedure Filed from anesthesia note documentation.

## 2022-11-12 NOTE — Consult Note (Signed)
  Consult requested by:  Dr. Bradler  Consult requested for:  T9 fracture  Primary Physician:  Linthavong, Kanhka, MD  History of Present Illness: 11/12/2022 [name] is here today with a chief complaint of being run over by his lawnmower.  His lawnmower began backing down his trailer and caused him to fall backwards to the ground after which she had immediate onset of severe mid spine pain.  He hit his head but had no loss of consciousness.  He is at his baseline state of cognition.  He denies any numbness or tingling in his legs.  He has severe pain with weightbearing.  During workup, an unstable T9-10 injury was identified.  After full evaluation, I was consulted for recommendations.  The symptoms are causing a significant impact on the patient's life.   I have utilized the care everywhere function in epic to review the outside records available from external health systems.  Review of Systems:  A 10 point review of systems is negative, except for the pertinent positives and negatives detailed in the HPI.  Past Medical History: Past Medical History:  Diagnosis Date   Arthritis    Diabetes mellitus without complication (HCC)    Hypertension    Prostate cancer (HCC)    Sleep apnea     Past Surgical History: Past Surgical History:  Procedure Laterality Date   APPENDECTOMY     BACK SURGERY     KNEE ARTHROPLASTY     PROSTATE SURGERY      Allergies: Allergies as of 11/11/2022 - Review Complete 11/11/2022  Allergen Reaction Noted   Dilaudid [hydromorphone hcl] Itching 01/22/2016    Medications: Current Meds  Medication Sig   aspirin EC 81 MG tablet Take 81 mg by mouth daily.   atorvastatin (LIPITOR) 40 MG tablet Take 40 mg by mouth daily.   cyanocobalamin 1000 MCG tablet Take 1,000 mcg by mouth daily.   cyclobenzaprine (FLEXERIL) 10 MG tablet Take by mouth.   Cysteamine Bitartrate (PROCYSBI) 300 MG PACK Check CBG's fasting once daily. HUMANA TRUE METRIX  AIR Dx: E11.69   DULoxetine (CYMBALTA) 30 MG capsule Take 1 capsule (30 mg total) by mouth daily.   ERLEADA 60 MG tablet Take 240 mg by mouth daily.   fenofibrate 160 MG tablet TAKE 1 TABLET DAILY   gabapentin (NEURONTIN) 800 MG tablet Take 800 mg by mouth 3 (three) times daily.   HYDROcodone-acetaminophen (NORCO) 10-325 MG tablet Take by mouth.   Insulin Pen Needle (ULTIGUARD SAFEPACK PEN NEEDLE) 32G X 4 MM MISC Use 1 each once daily   Lancets 30G MISC 1 each.   LEUPROLIDE ACETATE, 6 MONTH, 45 MG injection Inject 45 mg into the skin every 6 (six) months.   metFORMIN (GLUCOPHAGE-XR) 500 MG 24 hr tablet Take 1 tablet by mouth 2 (two) times daily. Pt taking one in the morning and one at night   metoprolol succinate (TOPROL-XL) 25 MG 24 hr tablet Take 25 mg by mouth 2 (two) times daily.   quinapril-hydrochlorothiazide (ACCURETIC) 10-12.5 MG tablet Take 1 tablet by mouth daily.    tamsulosin (FLOMAX) 0.4 MG CAPS capsule Take 0.4 mg by mouth daily as needed.   TOUJEO MAX SOLOSTAR 300 UNIT/ML Solostar Pen Inject into the skin.    Social History: Social History   Tobacco Use   Smoking status: Former    Packs/day: 2.00    Years: 30.00    Additional pack years: 0.00    Total pack years: 60.00      Types: Cigarettes    Quit date: 07/05/1996    Years since quitting: 26.3   Smokeless tobacco: Never  Vaping Use   Vaping Use: Never used  Substance Use Topics   Alcohol use: Yes    Comment: daily    Drug use: Not Currently    Family Medical History: Family History  Problem Relation Age of Onset   Diabetes Sister    Diabetes Maternal Grandmother    Lung disease Brother    Kidney failure Sister     Physical Examination: Vitals:   11/12/22 0300 11/12/22 0615  BP: 102/61 (!) 128/59  Pulse: 77 73  Resp: 18 16  Temp: (!) 97.5 F (36.4 C)   SpO2:  96%    General: Patient is in no apparent distress. Attention to examination is appropriate.  Neck:   No movement  Respiratory: Patient  is breathing without any difficulty.  He is in a TLSO.  His left arm is immobilized due to possible radial fracture.  NEUROLOGICAL:     Awake, alert, oriented to person, place, and time.  Speech is clear and fluent.  Cranial Nerves: Pupils equal round and reactive to light.  Facial tone is symmetric.  Facial sensation is symmetric. Shoulder shrug is symmetric. Tongue protrusion is midline.    Strength: Side Biceps Triceps Deltoid Interossei Grip Wrist Ext. Wrist Flex.  R 5 5 5 5 5 5 5  L - - - - - - -   Side Iliopsoas Quads Hamstring PF DF EHL  R 5 5 5 5 5 5  L 5 5 5 5 5 5   Reflexes are 1+ and symmetric at the biceps, triceps, brachioradialis, patella and achilles.   Hoffman's is absent.   Bilateral upper and lower extremity sensation is intact to light touch.    No evidence of dysmetria noted.  Gait is untested.     Medical Decision Making  Imaging: MRI T spine 11/12/22 IMPRESSION: 1. Diffuse ankylosis of the thoracic spine with fracture through the anterior T9-10 disc space and extending through the inferior endplate, posterior wall and bilateral posterior elements of T9. 2. 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 3. Severe attenuation of the thecal sac at T6-7 and T7-8 with severe mass effect on the spinal cord due to combination of central disc protrusions and epidural lipomatosis. Moderate thecal sac attenuation at T8-9.     Electronically Signed   By: Kevin  Herman M.D.   On: 11/12/2022 01:46  CT T spine 11/12/2022 IMPRESSION: 1. Distraction injury at T9 with acute fracture through the posteroinferior aspect of T9 vertebral body extending to the T9-T10 disc space and into the posterior elements bilaterally. There is also a transverse process fracture on the right at T10. Possible small fracture through the T9 spinous process posteriorly. Consider MRI for more detailed assessment. 2. Diffuse thoracic ankylosis with  flowing anterior osteophytes/syndesmophytes. 3. Posterior ossification possibly related to facet changes at the level of T6 and T7 that cause spinal canal stenosis.     Electronically Signed   By: Melanie  Sanford M.D.   On: 11/11/2022 22:41    I have personally reviewed the images and agree with the above interpretation.  Assessment and Plan: Alexander Wilcox is a pleasant 72 y.o. male with unstable 3 column injury at T9 extending into the T9-10 disc space.  He has a history of diffuse idiopathic skeletal hyperostosis.  This is an extremely unstable fracture that required surgical fixation.  Conservative   management with external orthosis is not an appropriate treatment, so I recommended surgical fixation with T7-12 posterior spinal instrumentation and fusion with open reduction internal fixation of the fracture.  I discussed the planned procedure at length with the patient, including the risks, benefits, alternatives, and indications. The risks discussed include but are not limited to bleeding, infection, need for reoperation, spinal fluid leak, stroke, vision loss, anesthetic complication, coma, paralysis, and even death. I also described in detail that improvement was not guaranteed.  The patient expressed understanding of these risks, and asked that we proceed with surgery. I described the surgery in layman's terms, and gave ample opportunity for questions, which were answered to the best of my ability.  I have communicated my recommendations to the requesting physician and coordinated care to facilitate these recommendations.     Lacoya Wilbanks K. Zailyn Rowser MD, MPHS Neurosurgery  

## 2022-11-12 NOTE — Assessment & Plan Note (Addendum)
BMI 42.63

## 2022-11-12 NOTE — H&P (View-Only) (Signed)
Consult requested by:  Dr. Vicente Males  Consult requested for:  T9 fracture  Primary Physician:  Marisue Ivan, MD  History of Present Illness: 11/12/2022 Alexander Wilcox is here today with a chief complaint of being run over by his lawnmower.  His lawnmower began backing down his trailer and caused him to fall backwards to the ground after which she had immediate onset of severe mid spine pain.  He hit his head but had no loss of consciousness.  He is at his baseline state of cognition.  He denies any numbness or tingling in his legs.  He has severe pain with weightbearing.  During workup, an unstable T9-10 injury was identified.  After full evaluation, I was consulted for recommendations.  The symptoms are causing a significant impact on the patient's life.   I have utilized the care everywhere function in epic to review the outside records available from external health systems.  Review of Systems:  A 10 point review of systems is negative, except for the pertinent positives and negatives detailed in the HPI.  Past Medical History: Past Medical History:  Diagnosis Date   Arthritis    Diabetes mellitus without complication (HCC)    Hypertension    Prostate cancer (HCC)    Sleep apnea     Past Surgical History: Past Surgical History:  Procedure Laterality Date   APPENDECTOMY     BACK SURGERY     KNEE ARTHROPLASTY     PROSTATE SURGERY      Allergies: Allergies as of 11/11/2022 - Review Complete 11/11/2022  Allergen Reaction Noted   Dilaudid [hydromorphone hcl] Itching 01/22/2016    Medications: Current Meds  Medication Sig   aspirin EC 81 MG tablet Take 81 mg by mouth daily.   atorvastatin (LIPITOR) 40 MG tablet Take 40 mg by mouth daily.   cyanocobalamin 1000 MCG tablet Take 1,000 mcg by mouth daily.   cyclobenzaprine (FLEXERIL) 10 MG tablet Take by mouth.   Cysteamine Bitartrate (PROCYSBI) 300 MG PACK Check CBG's fasting once daily. HUMANA TRUE METRIX  AIR Dx: E11.69   DULoxetine (CYMBALTA) 30 MG capsule Take 1 capsule (30 mg total) by mouth daily.   ERLEADA 60 MG tablet Take 240 mg by mouth daily.   fenofibrate 160 MG tablet TAKE 1 TABLET DAILY   gabapentin (NEURONTIN) 800 MG tablet Take 800 mg by mouth 3 (three) times daily.   HYDROcodone-acetaminophen (NORCO) 10-325 MG tablet Take by mouth.   Insulin Pen Needle (ULTIGUARD SAFEPACK PEN NEEDLE) 32G X 4 MM MISC Use 1 each once daily   Lancets 30G MISC 1 each.   LEUPROLIDE ACETATE, 6 MONTH, 45 MG injection Inject 45 mg into the skin every 6 (six) months.   metFORMIN (GLUCOPHAGE-XR) 500 MG 24 hr tablet Take 1 tablet by mouth 2 (two) times daily. Pt taking one in the morning and one at night   metoprolol succinate (TOPROL-XL) 25 MG 24 hr tablet Take 25 mg by mouth 2 (two) times daily.   quinapril-hydrochlorothiazide (ACCURETIC) 10-12.5 MG tablet Take 1 tablet by mouth daily.    tamsulosin (FLOMAX) 0.4 MG CAPS capsule Take 0.4 mg by mouth daily as needed.   TOUJEO MAX SOLOSTAR 300 UNIT/ML Solostar Pen Inject into the skin.    Social History: Social History   Tobacco Use   Smoking status: Former    Packs/day: 2.00    Years: 30.00    Additional pack years: 0.00    Total pack years: 60.00  Types: Cigarettes    Quit date: 07/05/1996    Years since quitting: 26.3   Smokeless tobacco: Never  Vaping Use   Vaping Use: Never used  Substance Use Topics   Alcohol use: Yes    Comment: daily    Drug use: Not Currently    Family Medical History: Family History  Problem Relation Age of Onset   Diabetes Sister    Diabetes Maternal Grandmother    Lung disease Brother    Kidney failure Sister     Physical Examination: Vitals:   11/12/22 0300 11/12/22 0615  BP: 102/61 (!) 128/59  Pulse: 77 73  Resp: 18 16  Temp: (!) 97.5 F (36.4 C)   SpO2:  96%    General: Patient is in no apparent distress. Attention to examination is appropriate.  Neck:   No movement  Respiratory: Patient  is breathing without any difficulty.  He is in a TLSO.  His left arm is immobilized due to possible radial fracture.  NEUROLOGICAL:     Awake, alert, oriented to person, place, and time.  Speech is clear and fluent.  Cranial Nerves: Pupils equal round and reactive to light.  Facial tone is symmetric.  Facial sensation is symmetric. Shoulder shrug is symmetric. Tongue protrusion is midline.    Strength: Side Biceps Triceps Deltoid Interossei Grip Wrist Ext. Wrist Flex.  R 5 5 5 5 5 5 5   L - - - - - - -   Side Iliopsoas Quads Hamstring PF DF EHL  R 5 5 5 5 5 5   L 5 5 5 5 5 5    Reflexes are 1+ and symmetric at the biceps, triceps, brachioradialis, patella and achilles.   Hoffman's is absent.   Bilateral upper and lower extremity sensation is intact to light touch.    No evidence of dysmetria noted.  Gait is untested.     Medical Decision Making  Imaging: MRI T spine 11/12/22 IMPRESSION: 1. Diffuse ankylosis of the thoracic spine with fracture through the anterior T9-10 disc space and extending through the inferior endplate, posterior wall and bilateral posterior elements of T9. 2. 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 3. Severe attenuation of the thecal sac at T6-7 and T7-8 with severe mass effect on the spinal cord due to combination of central disc protrusions and epidural lipomatosis. Moderate thecal sac attenuation at T8-9.     Electronically Signed   By: Deatra Robinson M.D.   On: 11/12/2022 01:46  CT T spine 11/12/2022 IMPRESSION: 1. Distraction injury at T9 with acute fracture through the posteroinferior aspect of T9 vertebral body extending to the T9-T10 disc space and into the posterior elements bilaterally. There is also a transverse process fracture on the right at T10. Possible small fracture through the T9 spinous process posteriorly. Consider MRI for more detailed assessment. 2. Diffuse thoracic ankylosis with  flowing anterior osteophytes/syndesmophytes. 3. Posterior ossification possibly related to facet changes at the level of T6 and T7 that cause spinal canal stenosis.     Electronically Signed   By: Narda Rutherford M.D.   On: 11/11/2022 22:41    I have personally reviewed the images and agree with the above interpretation.  Assessment and Plan: Mr. Carrara is a pleasant 72 y.o. male with unstable 3 column injury at T9 extending into the T9-10 disc space.  He has a history of diffuse idiopathic skeletal hyperostosis.  This is an extremely unstable fracture that required surgical fixation.  Conservative  management with external orthosis is not an appropriate treatment, so I recommended surgical fixation with T7-12 posterior spinal instrumentation and fusion with open reduction internal fixation of the fracture.  I discussed the planned procedure at length with the patient, including the risks, benefits, alternatives, and indications. The risks discussed include but are not limited to bleeding, infection, need for reoperation, spinal fluid leak, stroke, vision loss, anesthetic complication, coma, paralysis, and even death. I also described in detail that improvement was not guaranteed.  The patient expressed understanding of these risks, and asked that we proceed with surgery. I described the surgery in layman's terms, and gave ample opportunity for questions, which were answered to the best of my ability.  I have communicated my recommendations to the requesting physician and coordinated care to facilitate these recommendations.     Dorella Laster K. Myer Haff MD, [facility]

## 2022-11-12 NOTE — H&P (Signed)
History and Physical    Patient: Alexander Wilcox:[id number] DOB: January 06, 1951 DOA: 11/11/2022 DOS: the patient was seen and examined on 11/12/2022 PCP: Marisue Ivan, MD  Patient coming from: Home  Chief Complaint:  Chief Complaint  Patient presents with   Trauma    HPI: Alexander Wilcox is a 72 y.o. male with medical history significant for HTN, insulin-dependent type 2 diabetes with neuropathy, OSA on CPAP, HLD, prostate cancer on leuprolide, failed back surgical syndrome, and cervical myelopathy s/p cervical fusion who presented to the ED for evaluation of acute back pain related to trauma when his riding lawn more rolled backwards off the trailer, knocking him down, landing on his torso and left hip and pinning him to the ground.  He hit his head but did not lose consciousness.  His wife had to power on to mower and drive it off of him.  He was able to get up and cautiously ambulate but he has been complaining of abdominal, chest wall, left elbow ankle and right knee pain.  He was previously in his usual state of health. ED course and data review: Vitals within normal limits.  Labs notable for hyperglycemia of 273 with anion gap 16 and bicarb 22., creatinine 1.41.  Mild transaminitis with AST 50 and ALT 57.  WBC 11,900 with otherwise normal CBC. Trauma imaging showed a fracture of T9 on CT imaging and a left radial head nondisplaced fracture.  No other injuries noted on pan scan The ED provider spoke with neurosurgeon, Dr. Marcell Barlow who will take patient to the OR on 5/10. Treatment in the ED included Toradol, oxycodone, fentanyl as well as an NS bolus. Hospitalist consulted for admission.   Review of Systems: As mentioned in the history of present illness. All other systems reviewed and are negative.  Past Medical History:  Diagnosis Date   Arthritis    Diabetes mellitus without complication (HCC)    Hypertension    Prostate cancer (HCC)    Sleep apnea    Past Surgical  History:  Procedure Laterality Date   APPENDECTOMY     BACK SURGERY     KNEE ARTHROPLASTY     PROSTATE SURGERY     Social History:  reports that he quit smoking about 26 years ago. His smoking use included cigarettes. He has a 60.00 pack-year smoking history. He has never used smokeless tobacco. He reports current alcohol use. He reports that he does not currently use drugs.  Allergies  Allergen Reactions   Dilaudid [Hydromorphone Hcl] Itching    Family History  Problem Relation Age of Onset   Diabetes Sister    Diabetes Maternal Grandmother    Lung disease Brother    Kidney failure Sister     Prior to Admission medications   Medication Sig Start Date End Date Taking? Authorizing Provider  aspirin EC 81 MG tablet Take 81 mg by mouth daily.    [provider]  atorvastatin (LIPITOR) 40 MG tablet Take 40 mg by mouth daily.    [provider]  cyanocobalamin 1000 MCG tablet Take 1,000 mcg by mouth daily. 12/04/19   [provider]  cyclobenzaprine (FLEXERIL) 10 MG tablet Take by mouth. 09/09/14   [provider]  Cysteamine Bitartrate (PROCYSBI) 300 MG PACK Check CBG's fasting once daily. HUMANA TRUE METRIX AIR Dx: E11.69 09/22/21   [provider]  DULoxetine (CYMBALTA) 30 MG capsule Take 1 capsule (30 mg total) by mouth daily. 06/20/18   Gearldine Bienenstock, PA-C  ERLEADA 60 MG tablet Take 240 mg by mouth daily. 11/13/19   [provider]  fenofibrate 160 MG tablet TAKE 1 TABLET DAILY 01/25/18   [provider]  gabapentin (NEURONTIN) 800 MG tablet Take 800 mg by mouth 3 (three) times daily.    [provider]  glipiZIDE (GLUCOTROL) 10 MG tablet Take 20 mg by mouth every morning. 10/13/19   [provider]  HYDROcodone-acetaminophen (NORCO) 10-325 MG tablet Take by mouth. 08/04/17   [provider]  insulin glargine, 1 Unit Dial, (TOUJEO SOLOSTAR) 300 UNIT/ML Solostar Pen  12/04/21   [provider]   Insulin Pen Needle (ULTIGUARD SAFEPACK PEN NEEDLE) 32G X 4 MM MISC Use 1 each once daily 06/15/21   [provider]  Lancets 30G MISC 1 each. 11/03/16   [provider]  LEUPROLIDE ACETATE, 6 MONTH, 45 MG injection Inject 45 mg into the skin every 6 (six) months. 07/15/20   [provider]  metFORMIN (GLUCOPHAGE-XR) 500 MG 24 hr tablet Take 1 tablet by mouth 2 (two) times daily. Pt taking one in the morning and one at night 11/04/20   [provider]  metoprolol succinate (TOPROL-XL) 25 MG 24 hr tablet Take 25 mg by mouth 2 (two) times daily. 01/06/17   [provider]  omeprazole (PRILOSEC) 40 MG capsule Take 40 mg by mouth daily. 04/03/20 04/03/21  [provider]  quinapril-hydrochlorothiazide (ACCURETIC) 10-12.5 MG tablet Take 1 tablet by mouth daily.  05/22/14   [provider]  solifenacin (VESICARE) 10 MG tablet Take 10 mg by mouth daily as needed. 10/23/20   [provider]  tamsulosin (FLOMAX) 0.4 MG CAPS capsule Take 0.4 mg by mouth daily as needed. 04/29/20   [provider]  TOUJEO MAX SOLOSTAR 300 UNIT/ML Solostar Pen Inject into the skin.    [provider]    Physical Exam: Vitals:   11/11/22 2148 11/11/22 2217 11/11/22 2330 11/12/22 0208  BP: (!) 146/96  (!) 141/74   Pulse: 96 89 93   Resp: 19 15 16    Temp:    98 F (36.7 C)  TempSrc:    Oral  SpO2: 94% 95% 93%   Weight:      Height:       Physical Exam Vitals and nursing note reviewed.  Constitutional:      General: He is not in acute distress. HENT:     Head: Normocephalic and atraumatic.  Cardiovascular:     Rate and Rhythm: Normal rate and regular rhythm.     Heart sounds: Normal heart sounds.  Pulmonary:     Effort: Pulmonary effort is normal.     Breath sounds: Normal breath sounds.  Abdominal:     Palpations: Abdomen is soft.     Tenderness: There is no abdominal tenderness.  Musculoskeletal:     Comments: Patient in TLSO  brace and left arm splint  Neurological:     Mental Status: Mental status is at baseline.     Labs on Admission: I have personally reviewed following labs and imaging studies  CBC: Recent Labs  Lab 11/11/22 2152  WBC 11.9*  NEUTROABS 8.8*  HGB 15.9  HCT 46.5  MCV 87.6  PLT 202   Basic Metabolic Panel: Recent Labs  Lab 11/11/22 2152  NA 134*  K 4.2  CL 96*  CO2 22  GLUCOSE 273*  BUN 30*  CREATININE 1.41*  CALCIUM 10.3   GFR: Estimated Creatinine Clearance: 59.4 mL/min (A) (by C-G  formula based on SCr of 1.41 mg/dL (H)). Liver Function Tests: Recent Labs  Lab 11/11/22 2152  AST 50*  ALT 57*  ALKPHOS 87  BILITOT 1.1  PROT 8.6*  ALBUMIN 4.9   No results for input(s): "LIPASE", "AMYLASE" in the last 168 hours. No results for input(s): "AMMONIA" in the last 168 hours. Coagulation Profile: Recent Labs  Lab 11/12/22 0135  INR 1.1   Cardiac Enzymes: No results for input(s): "CKTOTAL", "CKMB", "CKMBINDEX", "TROPONINI" in the last 168 hours. BNP (last 3 results) No results for input(s): "PROBNP" in the last 8760 hours. HbA1C: No results for input(s): "HGBA1C" in the last 72 hours. CBG: No results for input(s): "GLUCAP" in the last 168 hours. Lipid Profile: No results for input(s): "CHOL", "HDL", "LDLCALC", "TRIG", "CHOLHDL", "LDLDIRECT" in the last 72 hours. Thyroid Function Tests: No results for input(s): "TSH", "T4TOTAL", "FREET4", "T3FREE", "THYROIDAB" in the last 72 hours. Anemia Panel: No results for input(s): "VITAMINB12", "FOLATE", "FERRITIN", "TIBC", "IRON", "RETICCTPCT" in the last 72 hours. Urine analysis:    Component Value Date/Time   COLORURINE YELLOW 06/10/2008 0902   APPEARANCEUR CLEAR 06/10/2008 0902   LABSPEC 1.018 06/10/2008 0902   PHURINE 5.5 06/10/2008 0902   GLUCOSEU NEGATIVE 06/10/2008 0902   HGBUR NEGATIVE 06/10/2008 0902   BILIRUBINUR NEGATIVE 06/10/2008 0902   KETONESUR NEGATIVE 06/10/2008 0902   PROTEINUR NEGATIVE 06/10/2008  0902   UROBILINOGEN 0.2 06/10/2008 0902   NITRITE NEGATIVE 06/10/2008 0902   LEUKOCYTESUR  06/10/2008 0902    NEGATIVE MICROSCOPIC NOT DONE ON URINES WITH NEGATIVE PROTEIN, BLOOD, LEUKOCYTES, NITRITE, OR GLUCOSE <1000 mg/dL.    Radiological Exams on Admission: MR THORACIC SPINE WO CONTRAST  Result Date: 11/12/2022 CLINICAL DATA:  Trauma EXAM: MRI THORACIC SPINE WITHOUT CONTRAST TECHNIQUE: Multiplanar, multisequence MR imaging of the thoracic spine was performed. No intravenous contrast was administered. COMPARISON:  CT thoracic spine 11/11/2022 FINDINGS: Alignment:  Normal Vertebrae: There is diffuse ankylosis of the thoracic spine. There is a fracture that traverses the anterior T9-10 disc space and extends through the inferior endplate and posterior wall of T9. The fracture extends through the bilateral posterior elements. The anterior longitudinal ligament is torn. Incidental T11 hemangioma. Cord: Normal signal. Severe mass effect on the spinal cord at the T6-8 levels. Paraspinal and other soft tissues: Small prevertebral effusion at the T8-12 levels. Disc levels: There is dorsal epidural lipomatosis throughout the thoracic spine. There is right dorsal spinal canal ossification at the T5-7 levels as shown on the earlier CT. There is severe attenuation of the thecal sac at T6-7 and T7-8 with marked mass effect on the spinal cord due to combination of central disc protrusions and epidural lipomatosis. Moderate thecal sac attenuation at T8-9. IMPRESSION: 1. Diffuse ankylosis of the thoracic spine with fracture through the anterior T9-10 disc space and extending through the inferior endplate, posterior wall and bilateral posterior elements of T9. 2. 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 3. Severe attenuation of the thecal sac at T6-7 and T7-8 with severe mass effect on the spinal cord due to combination of central disc protrusions and epidural lipomatosis. Moderate  thecal sac attenuation at T8-9. Electronically Signed   By: Deatra Robinson M.D.   On: 11/12/2022 01:46   DG Ankle Complete Left  Result Date: 11/11/2022 CLINICAL DATA:  Status post trauma. EXAM: LEFT ANKLE COMPLETE - 3+ VIEW COMPARISON:  None Available. FINDINGS: There is no evidence of an acute fracture, dislocation, or joint effusion. A chronic fracture deformity is  seen involving the left medial malleolus. Mild lateral soft tissue swelling is noted. Mild to moderate severity calcification of the plantar fascia of the proximal left foot is also seen. IMPRESSION: Mild lateral soft tissue swelling without evidence of acute fracture or dislocation. Electronically Signed   By: Aram Candela M.D.   On: 11/11/2022 23:13   DG Knee Complete 4 Views Right  Result Date: 11/11/2022 CLINICAL DATA:  Status post trauma. EXAM: RIGHT KNEE - COMPLETE 4+ VIEW COMPARISON:  None Available. FINDINGS: No evidence of an acute fracture or dislocation. Marked severity patellofemoral, medial tibiofemoral and lateral tibiofemoral compartment space narrowing is seen. Medial and lateral marginal osteophytes are also noted. There is a small to moderate sized joint effusion. IMPRESSION: 1. Marked severity tricompartmental osteoarthritis. 2. Small to moderate sized joint effusion. Electronically Signed   By: Aram Candela M.D.   On: 11/11/2022 23:12   CT CHEST ABDOMEN PELVIS W CONTRAST  Result Date: 11/11/2022 CLINICAL DATA:  Lump poly trauma. Crush injury. EXAM: CT CHEST, ABDOMEN, AND PELVIS WITH CONTRAST TECHNIQUE: Multidetector CT imaging of the chest, abdomen and pelvis was performed following the standard protocol during bolus administration of intravenous contrast. RADIATION DOSE REDUCTION: This exam was performed according to the departmental dose-optimization program which includes automated exposure control, adjustment of the mA and/or kV according to patient size and/or use of iterative reconstruction technique.  CONTRAST:  OMNIPAQUE IOHEXOL 300 MG/ML  SOLN COMPARISON:  Chest radiograph earlier today. FINDINGS: CT CHEST FINDINGS Cardiovascular: No evidence of acute aortic or vascular injury. Aortic atherosclerosis. Normal heart size. No central pulmonary embolus. No pericardial effusion. Mediastinum/Nodes: No mediastinal hemorrhage or hematoma. No pneumomediastinum. No esophageal wall thickening. No adenopathy. Lungs/Pleura: No pneumothorax. There is a trace right pleural effusion. No evidence of pulmonary contusion or focal airspace disease. The trachea and central airways are patent. Musculoskeletal: Thoracic spine injury is assessed on concurrent thoracic spine reformats, reported separately. No acute fracture of the ribs, included clavicles or shoulder girdles or sternum. There is no confluent chest wall soft tissue contusion. CT ABDOMEN PELVIS FINDINGS Hepatobiliary: No hepatic injury or perihepatic hematoma. Diffuse hepatic steatosis. The liver is enlarged spanning 23.6 cm cranial caudal. Gallbladder is unremarkable. Pancreas: No evidence of injury. No ductal dilatation or inflammation. Spleen: No splenic injury or perisplenic hematoma. Splenomegaly spleen spanning at least 13.9 cm cranial caudal. Adrenals/Urinary Tract: No adrenal hemorrhage or renal injury identified. Small cyst in the right kidney needs no further imaging follow-up. No hydronephrosis. Bladder is unremarkable. Stomach/Bowel: There is no evidence of bowel injury or mesenteric hematoma. No bowel wall thickening or inflammation. Prior appendectomy. Vascular/Lymphatic: Aortic atherosclerosis. No evidence of vascular injury. No retroperitoneal fluid. Patent portal vein. No suspicious adenopathy. Reproductive: Presumed prostatectomy. Other: No free air or free fluid. Fat in the left inguinal canal. There is no confluent body wall contusion. Musculoskeletal: Lumbar spine assessed on concurrent lumbar spine reformats, reported separately. Partial  fusion of the sacroiliac joints. No pelvic fracture. IMPRESSION: 1. No evidence of acute traumatic injury to the chest, abdomen, or pelvis. Please reference concurrent thoracic spine reformats for thoracic spine injury assessment. 2. Trace right pleural effusion. 3. Hepatosplenomegaly and hepatic steatosis. Aortic Atherosclerosis (ICD10-I70.0). Electronically Signed   By: Narda Rutherford M.D.   On: 11/11/2022 22:48   CT T-SPINE NO CHARGE  Result Date: 11/11/2022 CLINICAL DATA:  Trauma, crush injury. EXAM: CT THORACIC SPINE WITHOUT CONTRAST TECHNIQUE: Multidetector CT images of the thoracic were obtained using the standard protocol without intravenous contrast. RADIATION  DOSE REDUCTION: This exam was performed according to the departmental dose-optimization program which includes automated exposure control, adjustment of the mA and/or kV according to patient size and/or use of iterative reconstruction technique. COMPARISON:  Thoracic spine radiograph 11/10/2020 FINDINGS: Alignment: There is widening of the anterior aspect of T9-T10 disc space. Vertebrae: There is diffuse thoracic ankylosis flowing anterior osteophytes/syndesmophytes. Distraction injury at T9. There is an acute fracture through the posteroinferior aspect of T9 vertebral body extending to the T9-T10 disc space and into the posterior elements bilaterally. There is also a transverse process fracture on the right at T10. Possible small fracture through the T9 spinous process posteriorly. Incidental vertebral body hemangioma at T11. Paraspinal and other soft tissues: Anterior paraspinal soft tissue thickening at the level of distraction injury. Fully assessed on concurrent chest CT, reported separately. Disc levels: Diffuse flowing anterior osteophytes and syndesmophytes with thoracic ankylosis. There is posterior ossification possibly related to facet changes at the level of T6 and T7 that cause spinal canal stenosis. IMPRESSION: 1. Distraction injury  at T9 with acute fracture through the posteroinferior aspect of T9 vertebral body extending to the T9-T10 disc space and into the posterior elements bilaterally. There is also a transverse process fracture on the right at T10. Possible small fracture through the T9 spinous process posteriorly. Consider MRI for more detailed assessment. 2. Diffuse thoracic ankylosis with flowing anterior osteophytes/syndesmophytes. 3. Posterior ossification possibly related to facet changes at the level of T6 and T7 that cause spinal canal stenosis. Electronically Signed   By: Narda Rutherford M.D.   On: 11/11/2022 22:41   CT Cervical Spine Wo Contrast  Result Date: 11/11/2022 CLINICAL DATA:  Crush injury. Lawnmower rolled backwards on landed on his chest. EXAM: CT HEAD WITHOUT CONTRAST CT CERVICAL SPINE WITHOUT CONTRAST TECHNIQUE: Multidetector CT imaging of the head and cervical spine was performed following the standard protocol without intravenous contrast. Multiplanar CT image reconstructions of the cervical spine were also generated. RADIATION DOSE REDUCTION: This exam was performed according to the departmental dose-optimization program which includes automated exposure control, adjustment of the mA and/or kV according to patient size and/or use of iterative reconstruction technique. COMPARISON:  PET/CT 12/13/2019 and MRI cervical spine 12/10/2020 FINDINGS: CT HEAD FINDINGS Brain: No intracranial hemorrhage, mass effect, or evidence of acute infarct. No hydrocephalus. No extra-axial fluid collection. Vascular: No hyperdense vessel. Intracranial arterial calcification. Skull: No fracture or focal lesion. Sinuses/Orbits: No acute finding. Paranasal sinuses and mastoid air cells are well aerated. Other: None. CT CERVICAL SPINE FINDINGS Alignment: No evidence of traumatic malalignment. Skull base and vertebrae: No acute fracture. No primary bone lesion or focal pathologic process. Soft tissues and spinal canal: No prevertebral  fluid or swelling. No visible canal hematoma. Disc levels: Posterior fusion C3-C4. Multilevel advanced spondylosis, displaced height loss, degenerative endplate changes. Fusion of anterior osteophytes. 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 This causes moderate spinal canal narrowing. Uncovertebral spurring and facet arthropathy cause multilevel moderate neural foraminal narrowing. Upper chest: No acute abnormality. Other: Carotid calcification IMPRESSION: 1. No acute intracranial abnormality. 2. No acute fracture in the cervical spine. Multilevel degenerative spondylosis. Electronically Signed   By: Minerva Fester M.D.   On: 11/11/2022 22:36   CT Head Wo Contrast  Result Date: 11/11/2022 CLINICAL DATA:  Crush injury. Lawnmower rolled backwards on landed on his chest. EXAM: CT HEAD WITHOUT CONTRAST CT CERVICAL SPINE WITHOUT CONTRAST TECHNIQUE: Multidetector CT imaging of the head and cervical spine was performed following the standard protocol without  intravenous contrast. Multiplanar CT image reconstructions of the cervical spine were also generated. RADIATION DOSE REDUCTION: This exam was performed according to the departmental dose-optimization program which includes automated exposure control, adjustment of the mA and/or kV according to patient size and/or use of iterative reconstruction technique. COMPARISON:  PET/CT 12/13/2019 and MRI cervical spine 12/10/2020 FINDINGS: CT HEAD FINDINGS Brain: No intracranial hemorrhage, mass effect, or evidence of acute infarct. No hydrocephalus. No extra-axial fluid collection. Vascular: No hyperdense vessel. Intracranial arterial calcification. Skull: No fracture or focal lesion. Sinuses/Orbits: No acute finding. Paranasal sinuses and mastoid air cells are well aerated. Other: None. CT CERVICAL SPINE FINDINGS Alignment: No evidence of traumatic malalignment. Skull base and vertebrae: No acute fracture. No primary bone lesion or focal pathologic  process. Soft tissues and spinal canal: No prevertebral fluid or swelling. No visible canal hematoma. Disc levels: Posterior fusion C3-C4. Multilevel advanced spondylosis, displaced height loss, degenerative endplate changes. Fusion of anterior osteophytes. 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 This causes moderate spinal canal narrowing. Uncovertebral spurring and facet arthropathy cause multilevel moderate neural foraminal narrowing. Upper chest: No acute abnormality. Other: Carotid calcification IMPRESSION: 1. No acute intracranial abnormality. 2. No acute fracture in the cervical spine. Multilevel degenerative spondylosis. Electronically Signed   By: Minerva Fester M.D.   On: 11/11/2022 22:36   CT L-SPINE NO CHARGE  Result Date: 11/11/2022 CLINICAL DATA:  Crush injury, lawnmower accident. EXAM: CT LUMBAR SPINE WITHOUT CONTRAST TECHNIQUE: Multidetector CT imaging of the lumbar spine was performed without intravenous contrast administration. Multiplanar CT image reconstructions were also generated. RADIATION DOSE REDUCTION: This exam was performed according to the departmental dose-optimization program which includes automated exposure control, adjustment of the mA and/or kV according to patient size and/or use of iterative reconstruction technique. COMPARISON:  Lumbar radiograph 11/10/2020 FINDINGS: Segmentation: 5 lumbar type vertebrae. Alignment: Normal. Vertebrae: No acute fracture. Vertebral body heights are normal. The posterior elements are intact. Flowing anterior osteophytes throughout the lumbar spine. Paraspinal and other soft tissues: Assessed on concurrent abdominopelvic CT, reported separately. No lumbar paraspinal hematoma. Disc levels: Diffuse flowing anterior osteophytes and syndesmophytes. The disc spaces are relatively well preserved. There is mild posterior spurring at L5-S1. Moderate diffuse facet hypertrophy. Spinal canal stenosis at L2-L3, L3-L4, and L4-L5.  prior right L5 laminectomy. IMPRESSION: 1. No acute fracture or subluxation of the lumbar spine. 2. Flowing anterior osteophytes and syndesmophytes throughout the lumbar spine may represent ankylosis or diffuse idiopathic skeletal hyperostosis. 3. Spinal canal stenosis at L2-L3, L3-L4 and L4-L5. Electronically Signed   By: Narda Rutherford M.D.   On: 11/11/2022 22:32   DG Chest Port 1 View  Result Date: 11/11/2022 CLINICAL DATA:  Status post trauma. EXAM: PORTABLE CHEST 1 VIEW COMPARISON:  December 27, 2007 FINDINGS: The heart size and mediastinal contours are within normal limits. There is marked severity calcification of the aortic arch. Low lung volumes are noted. Both lungs are clear. Multilevel degenerative changes seen throughout the thoracic spine. IMPRESSION: Low lung volumes without active cardiopulmonary disease. Electronically Signed   By: Aram Candela M.D.   On: 11/11/2022 22:13   DG Elbow 2 Views Left  Result Date: 11/11/2022 CLINICAL DATA:  Status post trauma. EXAM: LEFT ELBOW - 2 VIEW COMPARISON:  None Available. FINDINGS: A small linear lucency of indeterminate age is seen involving the lateral aspect of the left radial head. There is no evidence of dislocation chronic and degenerative changes are seen involving the medial epicondyle of the distal left humerus and  volar aspect of the proximal left radius. Mild to moderate severity bony spurring is seen involving the left olecranon process. Soft tissues are unremarkable. IMPRESSION: Findings which may represent a small left radial head fracture of indeterminate age. Correlation with physical examination is recommended to determine the presence of point tenderness. Additional CT evaluation is recommended if acute fracture remains of clinical concern. Electronically Signed   By: Aram Candela M.D.   On: 11/11/2022 22:12     Data Reviewed: Relevant notes from primary care and specialist visits, past discharge summaries as available in EHR,  including Care Everywhere. Prior diagnostic testing as pertinent to current admission diagnoses Updated medications and problem lists for reconciliation ED course, including vitals, labs, imaging, treatment and response to treatment Triage notes, nursing and pharmacy notes and ED provider's notes Notable results as noted in HPI   Assessment and Plan: * Closed T9 spinal fracture (HCC) Chronic back pain/history of lumbar laminectomy and cervical fusion Neurosurgery to take to the OR on 5/10 TLSO brace was ordered from the ED Multimodal pain control  Preoperative clearance Patient with no history of CHF, CAD, COPD, stroke, arrhythmias and with fair exercise tolerance.  Does have history of OSA Last evaluated by his cardiologist on 10/11/2022 At low to moderate risk for perioperative cardiac pulmonary complications.  No absolute contraindications to proposed procedure  Uncontrolled type 2 diabetes mellitus with hyperglycemia, with long-term current use of insulin (HCC) Diabetic neuropathy Basal insulin with sliding scale coverage Continue gabapentin, duloxetine pending verification  Radial head fracture, closed Patient was splinted in the ED Ortho consult versus outpatient follow-up  Leukocytosis Likely reactive.  Patient without stigmata of infection Will get urinalysis  Transaminitis Mild elevation Monitor for improvement with hydration with further diagnostic evaluation if deemed necessary  Prostate cancer (HCC) On leuprolide every 6 months  OSA on CPAP Continue CPAP  Obesity, Class III, BMI 40-49.9 (morbid obesity) (HCC) Complicating factor to overall prognosis and care  Essential hypertension Hydralazine IV as needed for BP while n.p.o. for surgery     DVT prophylaxis: SCD  Consults: Surgery, Dr. Marcell Barlow  Advance Care Planning: full code  Family Communication: none  Disposition Plan: Back to previous home environment  Severity of Illness: The  appropriate patient status for this patient is INPATIENT. Inpatient status is judged to be reasonable and necessary in order to provide the required intensity of service to ensure the patient's safety. The patient's presenting symptoms, physical exam findings, and initial radiographic and laboratory data in the context of their chronic comorbidities is felt to place them at high risk for further clinical deterioration. Furthermore, it is not anticipated that the patient will be medically stable for discharge from the hospital within 2 midnights of admission.   * I certify that at the point of admission it is my clinical judgment that the patient will require inpatient hospital care spanning beyond 2 midnights from the point of admission due to high intensity of service, high risk for further deterioration and high frequency of surveillance required.*  Author: Andris Baumann, MD 11/12/2022 2:46 AM  For on call review [email or web address].

## 2022-11-12 NOTE — ED Notes (Signed)
Ortho tech at bedside 

## 2022-11-12 NOTE — Assessment & Plan Note (Signed)
Continue CPAP.  

## 2022-11-12 NOTE — Op Note (Signed)
Date of procedure: 11/12/22  Preoperative diagnosis:  Unable to place Foley Prostate cancer  Postoperative diagnosis:  Bladder neck contracture  Procedure: Cystoscopy, dilation of bladder neck contracture, complex Foley catheter placement  Surgeon: Legrand Rams, MD  Anesthesia: General  Complications: None  Intraoperative findings:  ~43F bladder neck contracture, extremely dense, dilated with Valora Corporal dilators and 16 [place] council Foley placed over [employer] Stiff wire  EBL: Minimal for urology portion  Specimens: None for urology portion  Drains: 16 [place] council Foley, 10 mL in balloon  Indication: KIMBERLY TOKARZ is a 72 y.o. patient with history of prostate cancer previously managed by Dr. Sheppard Penton, currently undergoing procedure with neurosurgery and they were unable to place the catheter and urology was consulted.  All of his prior urology records are not available.  Description of procedure:  The patient was intubated and sedated in supine position when I arrived at the conclusion of the neurosurgery case.  The phallus was prepped in standard sterile fashion, and I attempted to pass a 14 [employer] manager but met resistance at the bladder neck concerning for bladder neck contracture.  A flexible cystoscope was used to intubate the urethra and a normal-appearing urethra followed proximally towards the bladder.  There was a dense bladder neck contracture just proximal to the verumontanum.  Suspect he has had a radical prostatectomy previously.  A sensor wire was advanced into the bladder, and serial dilation with Heyman dilators performed from 12-20 [place].  Bladder neck contracture was extremely dense, requiring change to a Super Stiff wire.  I was able to advance a 16 [place] over [employer] wire with significant challenge, return of yellow urine noted, 10 mL placed in the balloon.  The Foley was connected to drainage.  Disposition: Stable to  PACU  Plan: Maintain Foley approximately 1 week(5 to 10 days), follow-up with PA or Dr. Lonna Cobb in clinic in 1 week for Foley removal  Legrand Rams, MD

## 2022-11-12 NOTE — Progress Notes (Signed)
Progress Note   Patient: Alexander Wilcox ZOX:[id number] DOB: Jun 09, 1951 DOA: 11/11/2022     0 DOS: the patient was seen and examined on 11/12/2022   Brief hospital course: 72 y.o. male with medical history significant for HTN, insulin-dependent type 2 diabetes with neuropathy, OSA on CPAP, HLD, prostate cancer on leuprolide, failed back surgical syndrome, and cervical myelopathy s/p cervical fusion who presented to the ED for evaluation of acute back pain related to trauma when his riding lawn more rolled backwards off the trailer, knocking him down, landing on his torso and left hip and pinning him to the ground.  He hit his head but did not lose consciousness.  His wife had to power on to mower and drive it off of him.  He was able to get up and cautiously ambulate but he has been complaining of abdominal, chest wall, left elbow ankle and right knee pain.  He was previously in his usual state of health. ED course and data review: Vitals within normal limits.  Labs notable for hyperglycemia of 273 with anion gap 16 and bicarb 22., creatinine 1.41.  Mild transaminitis with AST 50 and ALT 57.  WBC 11,900 with otherwise normal CBC. Trauma imaging showed a fracture of T9 on CT imaging and a left radial head nondisplaced fracture.  No other injuries noted on pan scan The ED provider spoke with neurosurgeon, Dr. Marcell Barlow who will take patient to the OR on 5/10.  5/10.  Dr. Myer Haff did open reduction internal fixation of T9 fracture and posterolateral arthrodesis T7-T12.  Dr. Richardo Hanks urology placed a Foley catheter for bladder neck contracture requiring dilatation.  When I came from my evaluation patient's wife noticed a left facial droop and intermittent slurred speech.  Case discussed with neurology and a CT angio head and neck ordered.  Patient not a candidate for tPA since had major surgery today.  Assessment and Plan: * Facial droop Left facial droop.  Will get a CT angio head and neck for further  evaluation.  CT scan of the head negative on presentation.  Not sure if this is secondary to anesthesia.  Case discussed with neurology.  Neurosurgery okay with aspirin if we need to give.  Closed T9 spinal fracture (HCC) Chronic back pain/history of lumbar laminectomy and cervical fusion Neurosurgery took to the OR today for open reduction internal fixation of T9 fracture and posterolateral arthrodesis T7-T12.  Uncontrolled type 2 diabetes mellitus with hyperglycemia, with long-term current use of insulin (HCC) Diabetic neuropathy Hemoglobin A1c 7.5 basal insulin with sliding scale coverage Continue gabapentin, duloxetine.  Radial head fracture, closed Patient was splinted in the ED   Leukocytosis Reactive.  Improved  Transaminitis Improved  Prostate cancer (HCC) On leuprolide every 6 months  OSA on CPAP Continue CPAP  Obesity, Class III, BMI 40-49.9 (morbid obesity) (HCC) BMI 42.63  Essential hypertension Continue Toprol.        Subjective: Came to see patient when he arrived in the room after surgery today.  Wife noticed some facial droop on the left.  Patient able to speak in complete sentences but seems a little bit slurred.  Patient had a urgent back surgery today by Dr. Myer Haff.  A lawnmower fell on him yesterday.  Physical Exam: Vitals:   11/12/22 1215 11/12/22 1230 11/12/22 1245 11/12/22 1339  BP: (!) 158/57 (!) 165/52 (!) 166/58 (!) 169/65  Pulse: 86 88 86 84  Resp: 10 12 12 16   Temp:  98 F (36.7 C)  97.6 F (  36.4 C)  TempSrc:      SpO2: 96% 92% 95% 96%  Weight:      Height:       Physical Exam HENT:     Head: Normocephalic.     Mouth/Throat:     Pharynx: No oropharyngeal exudate.  Eyes:     General: Lids are normal.     Conjunctiva/sclera: Conjunctivae normal.  Cardiovascular:     Rate and Rhythm: Normal rate and regular rhythm.     Heart sounds: Normal heart sounds, S1 normal and S2 normal.  Pulmonary:     Breath sounds: No decreased  breath sounds, wheezing, rhonchi or rales.  Abdominal:     Palpations: Abdomen is soft.     Tenderness: There is no abdominal tenderness.  Musculoskeletal:     Right lower leg: No swelling.     Left lower leg: No swelling.  Neurological:     Mental Status: He is alert and oriented to person, place, and time.     Comments: Slight left facial droop.  Intermittent slurring of speech.  Unable to lift left leg up.  Able to lift right leg up.  Weakness with flexing left ankle up and down.     Data Reviewed: Initial CT scan of the head and cervical spine negative CT scan of the chest abdomen and pelvis did not show any traumatic injury except for the thoracic spine.  Hepatic steatosis MRI thoracic spine shows diffuse ankylosis of the thoracic spine with fracture through anterior T9/10 disc space and extending through the inferior endplate, posterior wall and bilateral posterior elements of T9, tear of anterior ligament at T9 8 through 9 with prevertebral effusion, severe attenuation of thecal sac T6-7 and T7-8 with severe mass effect on spinal cord due to combination of central disc protrusions and epidural lipomatosis and moderate thecal sac attenuation T8-9. Hemoglobin A1c 7.5, hemoglobin 13.9, creatinine 1.42, sodium 134  Family Communication: Spoke with wife at bedside  Disposition: Status is: Inpatient Remains inpatient appropriate because: Postoperative day 0 for back surgery, evaluation for facial droop.  Planned Discharge Destination: To be determined    Time spent: 35 minutes Case discussed with neurosurgery and neurology and nursing staff.  Author: Alford Highland, MD 11/12/2022 2:17 PM  For on call review [email or web address].

## 2022-11-12 NOTE — Assessment & Plan Note (Deleted)
Patient with no history of CHF, CAD, COPD, stroke, arrhythmias and with fair exercise tolerance.  Does have history of OSA Last evaluated by his cardiologist on 10/11/2022 At low to moderate risk for perioperative cardiac pulmonary complications.  No absolute contraindications to proposed procedure

## 2022-11-13 DIAGNOSIS — S22078A Other fracture of T9-T10 vertebra, initial encounter for closed fracture: Secondary | ICD-10-CM

## 2022-11-13 DIAGNOSIS — M25522 Pain in left elbow: Secondary | ICD-10-CM | POA: Diagnosis not present

## 2022-11-13 DIAGNOSIS — E871 Hypo-osmolality and hyponatremia: Secondary | ICD-10-CM

## 2022-11-13 DIAGNOSIS — S22078S Other fracture of T9-T10 vertebra, sequela: Secondary | ICD-10-CM

## 2022-11-13 DIAGNOSIS — G4733 Obstructive sleep apnea (adult) (pediatric): Secondary | ICD-10-CM

## 2022-11-13 LAB — GLUCOSE, CAPILLARY
Glucose-Capillary: 179 mg/dL — ABNORMAL HIGH (ref 70–99)
Glucose-Capillary: 192 mg/dL — ABNORMAL HIGH (ref 70–99)
Glucose-Capillary: 214 mg/dL — ABNORMAL HIGH (ref 70–99)
Glucose-Capillary: 205 mg/dL — ABNORMAL HIGH (ref 70–99)
Glucose-Capillary: 208 mg/dL — ABNORMAL HIGH (ref 70–99)
Glucose-Capillary: 201 mg/dL — ABNORMAL HIGH (ref 70–99)

## 2022-11-13 MED ORDER — POLYETHYLENE GLYCOL 3350 17 G PO PACK
17.0000 g | PACK | Freq: Every day | ORAL | Status: DC
  Administered 2022-11-13 – 2022-11-14 (×2): 17 g via ORAL
  Filled 2022-11-13 (×2): qty 1

## 2022-11-13 NOTE — Evaluation (Signed)
Physical Therapy Evaluation Patient Details Name: Alexander Wilcox MRN: [id number] DOB: [birth year] Today's Date: 11/13/2022  History of Present Illness  Pt admitted to [facility] on 11/11/22 for c/o crush injury; riding lawn mower rolled backwards and landed on his chest for about 2-3 minutes. Pt endorses hitting his head on the ground, but no LOC, bil rib pain, L ankle pain, and R knee pain. Imaging significant for T9-10 fracture, T8-9 anterior longitudinal ligament tear,  and L radial head nondisplaced fracture. S/p surgical fixation with T7-12 posterior spinal instrumentation and fusion with ORIF on 5/10. Concern for stroke after surgery due to c/o dysarthria and facial droop; imaging negative and pt not a candidate for tPA due to recent major surgery. Significant PMH includes: arthritis, diabetes, HTN, prostate CA, sleep apnea, cLBP, T2DM with neuropathy, HLD, cervical myelopathy (s/p cervical fusion).   Clinical Impression  Pt is a 72 year old M admitted to hospital on 11/11/22 for crush injury resulting in T9-10 fracture. Now s/p T7-12 fusion on 5/10. At baseline, pt was IND with ADL's, IADL's, ambulation without AD, yard work, and driving.   Pt presents with generalized weakness (LLE>RLE), increased pain levels, decreased gross balance, decreased activity tolerance, resulting in impaired functional mobility from baseline. Denies N/T and sensation is intact/symmetrical in BLE. Due to deficits, pt required max-total assist for bed mobility and max assist +2 to clear buttocks a few inches from bed. Unable to achieve full upright standing at this time, due to generalized weakness and pain. Increased difficulty with seated balance at EOB, noted by posterior lean even with BUE support. Demonstrates improved balance with cues for anterior lean to prevent anterior hip translation towards EOB.  Deficits limit the pt's ability to safely and independently perform ADL's, transfer, and ambulate. Pt will benefit from  acute skilled PT services to address deficits for return to baseline function. At this time, PT recommends continued therapy services at facility, at DC. Family agreeable.        Recommendations for follow up therapy are one component of a multi-disciplinary discharge planning process, led by the attending physician.  Recommendations may be updated based on patient status, additional functional criteria and insurance authorization.  Follow Up Recommendations Can patient physically be transported by private vehicle: No     Assistance Recommended at Discharge Frequent or constant Supervision/Assistance  Patient can return home with the following  Two people to help with walking and/or transfers;Two people to help with bathing/dressing/bathroom;Assistance with cooking/housework;Direct supervision/assist for medications management;Direct supervision/assist for financial management;Assist for transportation;Help with stairs or ramp for entrance    Equipment Recommendations  (defer to post acute)  Recommendations for Other Services  OT consult    Functional Status Assessment Patient has had a recent decline in their functional status and demonstrates the ability to make significant improvements in function in a reasonable and predictable amount of time.     Precautions / Restrictions Precautions Precautions: Back;Fall Precaution Booklet Issued: No Required Braces or Orthoses:  (TLSO when OOB) Restrictions Weight Bearing Restrictions: No      Mobility  Bed Mobility Overal bed mobility: Needs Assistance Bed Mobility: Sit to Supine, Supine to Sit, Rolling Rolling: Total assist   Supine to sit: Max assist Sit to supine: Max assist, +2 for physical assistance   General bed mobility comments: total assist for rolling to the L for log roll technique; max multimodal cues for sequencing and set up. Max assist for trunk facilitation to sit upright at EOB. Max assist +2 to  return to supine via  log roll technique. Increased time/effort for all functional mobility secondary to pain.    Transfers Overall transfer level: Needs assistance Equipment used: Rolling walker (2 wheels) Transfers: Sit to/from Stand Sit to Stand: Max assist, +2 physical assistance           General transfer comment: elevated bed height; able to clear buttock a few inches off the bed, but unable to acheive full upright standing despite x2 attempts.        Balance Overall balance assessment: Needs assistance   Sitting balance-Leahy Scale: Poor Sitting balance - Comments: intermittent posterior lean in sitting requiring BUE support for steadying; assist ranging from CGA-mod assist with frequent cues for anterior lean to present anterior hip translation Postural control: Posterior lean                                   Pertinent Vitals/Pain Pain Assessment Pain Assessment: 0-10 Pain Score: 7  Pain Location: back Pain Intervention(s): Limited activity within patient's tolerance, Monitored during session, Repositioned, Patient requesting pain meds-RN notified    Home Living Family/patient expects to be discharged to:: Private residence Living Arrangements: Spouse/significant other Available Help at Discharge: Family;Available 24 hours/day Type of Home: House Home Access: Stairs to enter Entrance Stairs-Rails: Right Entrance Stairs-Number of Steps: garage* 4-5   Home Layout: One level Home Equipment: Wheelchair - manual      Prior Function Prior Level of Function : Independent/Modified Independent                        Extremity/Trunk Assessment   Upper Extremity Assessment Upper Extremity Assessment: Defer to OT evaluation    Lower Extremity Assessment Lower Extremity Assessment: Generalized weakness (not formally assessed due to acuity of sx; increased difficulty moving BLE (L>R) in gravity dependent position. Unable to WB through BLE secondary to pain and  weakness. Sensation intact bil. Hx peripheral neuropathy.)       Communication   Communication: No difficulties  Cognition Arousal/Alertness: Awake/alert Behavior During Therapy: WFL for tasks assessed/performed Overall Cognitive Status: Within Functional Limits for tasks assessed                                 General Comments: Able to follow 2-step commands        General Comments General comments (skin integrity, edema, etc.): wound vac intact    Exercises Other Exercises Other Exercises: Pt participates in bed mobility and transfers. Increased time/effort secondary to pain and generalized weakness. Other Exercises: Pt and spouse educated re: PT role/POC, DC recommendations, spinal precautions, TLSO management, safety with mobility, and rehab prognosis.   Assessment/Plan    PT Assessment Patient needs continued PT services  PT Problem List Decreased strength;Decreased range of motion;Decreased activity tolerance;Decreased balance;Decreased mobility;Decreased safety awareness;Pain;Obesity       PT Treatment Interventions DME instruction;Gait training;Stair training;Functional mobility training;Therapeutic activities;Therapeutic exercise;Balance training;Neuromuscular re-education;Patient/family education    PT Goals (Current goals can be found in the Care Plan section)  Acute Rehab PT Goals Patient Stated Goal: "get better" PT Goal Formulation: With patient/family Time For Goal Achievement: 12/04/22 Potential to Achieve Goals: Good    Frequency Min 2X/week        AM-PAC PT "6 Clicks" Mobility  Outcome Measure Help needed turning from your back to your side while in a  flat bed without using bedrails?: Total Help needed moving from lying on your back to sitting on the side of a flat bed without using bedrails?: A Lot Help needed moving to and from a bed to a chair (including a wheelchair)?: Total Help needed standing up from a chair using your arms  (e.g., wheelchair or bedside chair)?: A Lot Help needed to walk in hospital room?: Total Help needed climbing 3-5 steps with a railing? : Total 6 Click Score: 8    End of Session Equipment Utilized During Treatment: Gait belt;Back brace Activity Tolerance: Patient limited by pain Patient left: in bed;with call bell/phone within reach;with bed alarm set;with family/visitor present Nurse Communication: Mobility status;Patient requests pain meds PT Visit Diagnosis: Unsteadiness on feet (R26.81);Muscle weakness (generalized) (M62.81);Difficulty in walking, not elsewhere classified (R26.2);Pain Pain - Right/Left:  (back; bil ribs; LLE)    Time: 1610-9604 (ortho MD present for consult of LUE during session (~10 min)) PT Time Calculation (min) (ACUTE ONLY): 50 min   Charges:   PT Evaluation $PT Eval Moderate Complexity: 1 Mod PT Treatments $Therapeutic Activity: 8-22 mins        Vira Blanco, PT, DPT 12:23 PM,11/13/22 Physical Therapist - [place] [facility]

## 2022-11-13 NOTE — Consult Note (Signed)
ORTHOPAEDIC CONSULTATION  REQUESTING PHYSICIAN: Alford Highland, MD  Chief Complaint:   L elbow pain  History of Present Illness: Alexander Wilcox is a 72 y.o. male who presented 2 days ago after a riding lawnmower fell onto him, causing him to fall. He underwent emergent thoracic spine fusion yesterday due to unstable 3 column injury in the setting of DISH. Radiographs in the ED showed a possible radial head fracture.  He was placed in a splint.  This morning, he states that he does not have significant elbow pain.  He notes that his knee and ankles and back hurt much more than his left elbow.  Past Medical History:  Diagnosis Date   Arthritis    Diabetes mellitus without complication (HCC)    Hypertension    Prostate cancer (HCC)    Sleep apnea    Past Surgical History:  Procedure Laterality Date   APPENDECTOMY     BACK SURGERY     KNEE ARTHROPLASTY     PROSTATE SURGERY     Social History   Socioeconomic History   Marital status: Married    Spouse name: Not on file   Number of children: Not on file   Years of education: Not on file   Highest education level: Not on file  Occupational History   Not on file  Tobacco Use   Smoking status: Former    Packs/day: 2.00    Years: 30.00    Additional pack years: 0.00    Total pack years: 60.00    Types: Cigarettes    Quit date: 07/05/1996    Years since quitting: 26.3   Smokeless tobacco: Never  Vaping Use   Vaping Use: Never used  Substance and Sexual Activity   Alcohol use: Yes    Comment: daily    Drug use: Not Currently   Sexual activity: Not on file  Other Topics Concern   Not on file  Social History Narrative   Not on file   Social Determinants of Health   Financial Resource Strain: Not on file  Food Insecurity: No Food Insecurity (11/12/2022)   Hunger Vital Sign    Worried About Running Out of Food in the Last Year: Never true    Ran Out of  Food in the Last Year: Never true  Transportation Needs: No Transportation Needs (11/12/2022)   PRAPARE - Administrator, Civil Service (Medical): No    Lack of Transportation (Non-Medical): No  Physical Activity: Not on file  Stress: Not on file  Social Connections: Not on file   Family History  Problem Relation Age of Onset   Diabetes Sister    Diabetes Maternal Grandmother    Lung disease Brother    Kidney failure Sister    Allergies  Allergen Reactions   Dilaudid [Hydromorphone Hcl] Itching   Prior to Admission medications   Medication Sig Start Date End Date Taking? Authorizing Provider  aspirin EC 81 MG tablet Take 81 mg by mouth daily.   Yes [provider]  atorvastatin (LIPITOR) 40 MG tablet Take 40 mg by mouth daily.   Yes [provider]  cyanocobalamin 1000 MCG tablet Take 1,000 mcg by mouth daily. 12/04/19  Yes [provider]  cyclobenzaprine (FLEXERIL) 10 MG tablet Take by mouth. 09/09/14  Yes [provider]  Cysteamine Bitartrate (PROCYSBI) 300 MG PACK Check CBG's fasting once daily. HUMANA TRUE METRIX AIR Dx: E11.69 09/22/21  Yes [provider]  DULoxetine (CYMBALTA) 30 MG capsule  Take 1 capsule (30 mg total) by mouth daily. 06/20/18  Yes Gearldine Bienenstock, PA-C  ERLEADA 60 MG tablet Take 240 mg by mouth daily. 11/13/19  Yes [provider]  fenofibrate 160 MG tablet TAKE 1 TABLET DAILY 01/25/18  Yes [provider]  gabapentin (NEURONTIN) 800 MG tablet Take 800 mg by mouth 3 (three) times daily.   Yes [provider]  HYDROcodone-acetaminophen (NORCO) 10-325 MG tablet Take by mouth. 08/04/17  Yes [provider]  insulin glargine, 1 Unit Dial, (TOUJEO SOLOSTAR) 300 UNIT/ML Solostar Pen  12/04/21  Yes [provider]  Insulin Pen Needle (ULTIGUARD SAFEPACK PEN NEEDLE) 32G X 4 MM MISC Use 1 each once daily 06/15/21  Yes [provider]  Lancets 30G MISC 1 each. 11/03/16   Yes [provider]  LEUPROLIDE ACETATE, 6 MONTH, 45 MG injection Inject 45 mg into the skin every 6 (six) months. 07/15/20  Yes [provider]  lisinopril-hydrochlorothiazide (ZESTORETIC) 10-12.5 MG tablet Take 1 tablet by mouth daily. 12/04/21  Yes [provider]  metFORMIN (GLUCOPHAGE-XR) 500 MG 24 hr tablet Take 1 tablet by mouth 2 (two) times daily. Pt taking one in the morning and one at night 11/04/20  Yes [provider]  metoprolol succinate (TOPROL-XL) 25 MG 24 hr tablet Take 25 mg by mouth 2 (two) times daily. 01/06/17  Yes [provider]  pregabalin (LYRICA) 75 MG capsule Take 75 mg by mouth 2 (two) times daily. 10/12/22  Yes [provider]  quinapril-hydrochlorothiazide (ACCURETIC) 10-12.5 MG tablet Take 1 tablet by mouth daily.  05/22/14  Yes [provider]  tamsulosin (FLOMAX) 0.4 MG CAPS capsule Take 0.4 mg by mouth daily as needed. 04/29/20  Yes [provider]  TOUJEO MAX SOLOSTAR 300 UNIT/ML Solostar Pen Inject into the skin.   Yes [provider]  nortriptyline (PAMELOR) 50 MG capsule Take 50 mg by mouth at bedtime.    [provider]  omeprazole (PRILOSEC) 40 MG capsule Take 40 mg by mouth daily. 04/03/20 04/03/21  [provider]   Recent Labs    11/11/22 2152 11/12/22 0135 11/12/22 0424  WBC 11.9*  --  7.1  HGB 15.9  --  13.9  HCT 46.5  --  40.4  PLT 202  --  161  K 4.2  --  3.8  CL 96*  --  100  CO2 22  --  23  BUN 30*  --  35*  CREATININE 1.41*  --  1.42*  GLUCOSE 273*  --  169*  CALCIUM 10.3  --  9.3  INR  --  1.1  --    MR BRAIN WO CONTRAST  Result Date: 11/12/2022 CLINICAL DATA:  Facial droop EXAM: MRI HEAD WITHOUT CONTRAST TECHNIQUE: Multiplanar, multiecho pulse sequences of the brain and surrounding structures were obtained without intravenous contrast. COMPARISON:  None Available. FINDINGS: Brain: No acute infarct, mass effect or extra-axial collection. No acute  or chronic hemorrhage. Normal white matter signal, parenchymal volume and CSF spaces. The midline structures are normal. Vascular: Major flow voids are preserved. Skull and upper cervical spine: Normal calvarium and skull base. Visualized upper cervical spine and soft tissues are normal. Sinuses/Orbits:No paranasal sinus fluid levels or advanced mucosal thickening. No mastoid or middle ear effusion. Normal orbits. IMPRESSION: Normal brain MRI. Electronically Signed   By: Deatra Robinson M.D.   On: 11/12/2022 22:13   MR THORACIC SPINE WO CONTRAST  Result Date: 11/12/2022 CLINICAL DATA:  Trauma EXAM:  MRI THORACIC SPINE WITHOUT CONTRAST TECHNIQUE: Multiplanar, multisequence MR imaging of the thoracic spine was performed. No intravenous contrast was administered. COMPARISON:  11/11/2022 thoracic spine CT and 11/12/2022 preoperative thoracic spine MRI FINDINGS: Alignment:  Physiologic. Vertebrae: There is now posterior instrumented fusion extending from T7-T12, traversing the site of T9-10 fracture. No new osseous abnormality. Cord:  Normal signal and morphology. Paraspinal and other soft tissues: Negative. Disc levels: Unchanged severe narrowing of the thecal sac at T6-7 and T7-8 IMPRESSION: Posterior instrumented fusion extending from T7-T12, traversing the site of T9-10 fracture. Alignment is normal. Electronically Signed   By: Deatra Robinson M.D.   On: 11/12/2022 22:11   CT ANGIO HEAD NECK W WO CM  Result Date: 11/12/2022 CLINICAL DATA:  Acute neurologic deficit. EXAM: CT ANGIOGRAPHY HEAD AND NECK WITH AND WITHOUT CONTRAST TECHNIQUE: Multidetector CT imaging of the head and neck was performed using the standard protocol during bolus administration of intravenous contrast. Multiplanar CT image reconstructions and MIPs were obtained to evaluate the vascular anatomy. Carotid stenosis measurements (when applicable) are obtained utilizing NASCET criteria, using the distal internal carotid diameter as the denominator.  RADIATION DOSE REDUCTION: This exam was performed according to the departmental dose-optimization program which includes automated exposure control, adjustment of the mA and/or kV according to patient size and/or use of iterative reconstruction technique. CONTRAST:  75mL OMNIPAQUE IOHEXOL 350 MG/ML SOLN COMPARISON:  None Available. FINDINGS: CT HEAD FINDINGS Brain: There is no mass, hemorrhage or extra-axial collection. The size and configuration of the ventricles and extra-axial CSF spaces are normal. There is no acute or chronic infarction. The brain parenchyma is normal. Skull: The visualized skull base, calvarium and extracranial soft tissues are normal. Sinuses/Orbits: No fluid levels or advanced mucosal thickening of the visualized paranasal sinuses. No mastoid or middle ear effusion. The orbits are normal. CTA NECK FINDINGS SKELETON: There is no bony spinal canal stenosis. No lytic or blastic lesion. OTHER NECK: Normal pharynx, larynx and major salivary glands. No cervical lymphadenopathy. Unremarkable thyroid gland. UPPER CHEST: No pneumothorax or pleural effusion. No nodules or masses. AORTIC ARCH: There is no calcific atherosclerosis of the aortic arch. There is no aneurysm, dissection or hemodynamically significant stenosis of the visualized portion of the aorta. Conventional 3 vessel aortic branching pattern. The visualized proximal subclavian arteries are widely patent. RIGHT CAROTID SYSTEM: No dissection, occlusion or aneurysm. Mild atherosclerotic calcification at the carotid bifurcation without hemodynamically significant stenosis. LEFT CAROTID SYSTEM: No dissection, occlusion or aneurysm. Mild atherosclerotic calcification at the carotid bifurcation without hemodynamically significant stenosis. VERTEBRAL ARTERIES: Left dominant configuration. There are streak artifacts obscuring much of the V2 segments. No visible opacification of the right V3 segment, which may be occluded. CTA HEAD FINDINGS  POSTERIOR CIRCULATION: --Vertebral arteries: Occluded right V4 segment. Multifocal atherosclerotic calcification of the left V4 segment with severe stenosis. --Inferior cerebellar arteries: Patent --Basilar artery: Multifocal severe stenosis --Superior cerebellar arteries: Normal. --Posterior cerebral arteries (PCA): Predominantly fetal origin of the right PCA. There is severe stenosis of the proximal right P2 segment. Normal left PCA origin. Multifocal atherosclerotic irregularity. ANTERIOR CIRCULATION: --Intracranial internal carotid arteries: Atherosclerotic calcification bilaterally with severe stenosis of the supraclinoid segments. --Anterior cerebral arteries (ACA): Normal. Both A1 segments are present. Patent anterior communicating artery (a-comm). --Middle cerebral arteries (MCA): Multifocal mild atherosclerotic irregularity without significant stenosis. VENOUS SINUSES: As permitted by contrast timing, patent. Review of the MIP images confirms the above findings. IMPRESSION: 1. 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 Severe stenosis of the V4  segment of the left vertebral artery. 2. 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 3. Severe stenosis of the supraclinoid internal carotid arteries. 4. Mild bilateral carotid bifurcation atherosclerosis without hemodynamically significant stenosis. Electronically Signed   By: Deatra Robinson M.D.   On: 11/12/2022 20:01   DG Thoracic Spine 2 View  Result Date: 11/12/2022 CLINICAL DATA:  T7-T12 posterior fusion EXAM: THORACIC SPINE 2 VIEWS COMPARISON:  11/12/2022 FLUOROSCOPY: Exposure Index (as provided by the fluoroscopic device): 160.2 mGy FINDINGS: Intraoperative fluoroscopic and CT assistance was given during the performance of the procedure, and images are provided for interpretation only. Images demonstrate placement of posterior fusion rod and intra corporal screws spanning T7 through T12. Alignment is  anatomic. Please refer to the operative report. IMPRESSION: 1. Intraoperative evaluation as above. Please refer to the operative report. Electronically Signed   By: Sharlet Salina M.D.   On: 11/12/2022 11:19   DG C-Arm 1-60 Min-No Report  Result Date: 11/12/2022 Fluoroscopy was utilized by the requesting physician.  No radiographic interpretation.   DG C-Arm 1-60 Min-No Report  Result Date: 11/12/2022 Fluoroscopy was utilized by the requesting physician.  No radiographic interpretation.   MR THORACIC SPINE WO CONTRAST  Result Date: 11/12/2022 CLINICAL DATA:  Trauma EXAM: MRI THORACIC SPINE WITHOUT CONTRAST TECHNIQUE: Multiplanar, multisequence MR imaging of the thoracic spine was performed. No intravenous contrast was administered. COMPARISON:  CT thoracic spine 11/11/2022 FINDINGS: Alignment:  Normal Vertebrae: There is diffuse ankylosis of the thoracic spine. There is a fracture that traverses the anterior T9-10 disc space and extends through the inferior endplate and posterior wall of T9. The fracture extends through the bilateral posterior elements. The anterior longitudinal ligament is torn. Incidental T11 hemangioma. Cord: Normal signal. Severe mass effect on the spinal cord at the T6-8 levels. Paraspinal and other soft tissues: Small prevertebral effusion at the T8-12 levels. Disc levels: There is dorsal epidural lipomatosis throughout the thoracic spine. There is right dorsal spinal canal ossification at the T5-7 levels as shown on the earlier CT. There is severe attenuation of the thecal sac at T6-7 and T7-8 with marked mass effect on the spinal cord due to combination of central disc protrusions and epidural lipomatosis. Moderate thecal sac attenuation at T8-9. IMPRESSION: 1. Diffuse ankylosis of the thoracic spine with fracture through the anterior T9-10 disc space and extending through the inferior endplate, posterior wall and bilateral posterior elements of T9. 2. Tear of the anterior  longitudinal ligament at T8-9 with prevertebral effusion. 3. Severe attenuation of the thecal sac at T6-7 and T7-8 with severe mass effect on the spinal cord due to combination of central disc protrusions and epidural lipomatosis. Moderate thecal sac attenuation at T8-9. Electronically Signed   By: Deatra Robinson M.D.   On: 11/12/2022 01:46   DG Ankle Complete Left  Result Date: 11/11/2022 CLINICAL DATA:  Status post trauma. EXAM: LEFT ANKLE COMPLETE - 3+ VIEW COMPARISON:  None Available. FINDINGS: There is no evidence of an acute fracture, dislocation, or joint effusion. A chronic fracture deformity is seen involving the left medial malleolus. Mild lateral soft tissue swelling is noted. Mild to moderate severity calcification of the plantar fascia of the proximal left foot is also seen. IMPRESSION: Mild lateral soft tissue swelling without evidence of acute fracture or dislocation. Electronically Signed   By: Aram Candela M.D.   On: 11/11/2022 23:13   DG Knee Complete 4 Views Right  Result Date: 11/11/2022 CLINICAL DATA:  Status  post trauma. EXAM: RIGHT KNEE - COMPLETE 4+ VIEW COMPARISON:  None Available. FINDINGS: No evidence of an acute fracture or dislocation. Marked severity patellofemoral, medial tibiofemoral and lateral tibiofemoral compartment space narrowing is seen. Medial and lateral marginal osteophytes are also noted. There is a small to moderate sized joint effusion. IMPRESSION: 1. Marked severity tricompartmental osteoarthritis. 2. Small to moderate sized joint effusion. Electronically Signed   By: Aram Candela M.D.   On: 11/11/2022 23:12   CT CHEST ABDOMEN PELVIS W CONTRAST  Result Date: 11/11/2022 CLINICAL DATA:  Lump poly trauma. Crush injury. EXAM: CT CHEST, ABDOMEN, AND PELVIS WITH CONTRAST TECHNIQUE: Multidetector CT imaging of the chest, abdomen and pelvis was performed following the standard protocol during bolus administration of intravenous contrast. RADIATION DOSE  REDUCTION: This exam was performed according to the departmental dose-optimization program which includes automated exposure control, adjustment of the mA and/or kV according to patient size and/or use of iterative reconstruction technique. CONTRAST:  OMNIPAQUE IOHEXOL 300 MG/ML  SOLN COMPARISON:  Chest radiograph earlier today. FINDINGS: CT CHEST FINDINGS Cardiovascular: No evidence of acute aortic or vascular injury. Aortic atherosclerosis. Normal heart size. No central pulmonary embolus. No pericardial effusion. Mediastinum/Nodes: No mediastinal hemorrhage or hematoma. No pneumomediastinum. No esophageal wall thickening. No adenopathy. Lungs/Pleura: No pneumothorax. There is a trace right pleural effusion. No evidence of pulmonary contusion or focal airspace disease. The trachea and central airways are patent. Musculoskeletal: Thoracic spine injury is assessed on concurrent thoracic spine reformats, reported separately. No acute fracture of the ribs, included clavicles or shoulder girdles or sternum. There is no confluent chest wall soft tissue contusion. CT ABDOMEN PELVIS FINDINGS Hepatobiliary: No hepatic injury or perihepatic hematoma. Diffuse hepatic steatosis. The liver is enlarged spanning 23.6 cm cranial caudal. Gallbladder is unremarkable. Pancreas: No evidence of injury. No ductal dilatation or inflammation. Spleen: No splenic injury or perisplenic hematoma. Splenomegaly spleen spanning at least 13.9 cm cranial caudal. Adrenals/Urinary Tract: No adrenal hemorrhage or renal injury identified. Small cyst in the right kidney needs no further imaging follow-up. No hydronephrosis. Bladder is unremarkable. Stomach/Bowel: There is no evidence of bowel injury or mesenteric hematoma. No bowel wall thickening or inflammation. Prior appendectomy. Vascular/Lymphatic: Aortic atherosclerosis. No evidence of vascular injury. No retroperitoneal fluid. Patent portal vein. No suspicious adenopathy. Reproductive:  Presumed prostatectomy. Other: No free air or free fluid. Fat in the left inguinal canal. There is no confluent body wall contusion. Musculoskeletal: Lumbar spine assessed on concurrent lumbar spine reformats, reported separately. Partial fusion of the sacroiliac joints. No pelvic fracture. IMPRESSION: 1. No evidence of acute traumatic injury to the chest, abdomen, or pelvis. Please reference concurrent thoracic spine reformats for thoracic spine injury assessment. 2. Trace right pleural effusion. 3. Hepatosplenomegaly and hepatic steatosis. Aortic Atherosclerosis (ICD10-I70.0). Electronically Signed   By: Narda Rutherford M.D.   On: 11/11/2022 22:48   CT T-SPINE NO CHARGE  Result Date: 11/11/2022 CLINICAL DATA:  Trauma, crush injury. EXAM: CT THORACIC SPINE WITHOUT CONTRAST TECHNIQUE: Multidetector CT images of the thoracic were obtained using the standard protocol without intravenous contrast. RADIATION DOSE REDUCTION: This exam was performed according to the departmental dose-optimization program which includes automated exposure control, adjustment of the mA and/or kV according to patient size and/or use of iterative reconstruction technique. COMPARISON:  Thoracic spine radiograph 11/10/2020 FINDINGS: Alignment: There is widening of the anterior aspect of T9-T10 disc space. Vertebrae: There is diffuse thoracic ankylosis flowing anterior osteophytes/syndesmophytes. Distraction injury at T9. There is an acute fracture through the posteroinferior  aspect of T9 vertebral body extending to the T9-T10 disc space and into the posterior elements bilaterally. There is also a transverse process fracture on the right at T10. Possible small fracture through the T9 spinous process posteriorly. Incidental vertebral body hemangioma at T11. Paraspinal and other soft tissues: Anterior paraspinal soft tissue thickening at the level of distraction injury. Fully assessed on concurrent chest CT, reported separately. Disc levels:  Diffuse flowing anterior osteophytes and syndesmophytes with thoracic ankylosis. There is posterior ossification possibly related to facet changes at the level of T6 and T7 that cause spinal canal stenosis. IMPRESSION: 1. Distraction injury at T9 with acute fracture through the posteroinferior aspect of T9 vertebral body extending to the T9-T10 disc space and into the posterior elements bilaterally. There is also a transverse process fracture on the right at T10. Possible small fracture through the T9 spinous process posteriorly. Consider MRI for more detailed assessment. 2. Diffuse thoracic ankylosis with flowing anterior osteophytes/syndesmophytes. 3. Posterior ossification possibly related to facet changes at the level of T6 and T7 that cause spinal canal stenosis. Electronically Signed   By: Narda Rutherford M.D.   On: 11/11/2022 22:41   CT Cervical Spine Wo Contrast  Result Date: 11/11/2022 CLINICAL DATA:  Crush injury. Lawnmower rolled backwards on landed on his chest. EXAM: CT HEAD WITHOUT CONTRAST CT CERVICAL SPINE WITHOUT CONTRAST TECHNIQUE: Multidetector CT imaging of the head and cervical spine was performed following the standard protocol without intravenous contrast. Multiplanar CT image reconstructions of the cervical spine were also generated. RADIATION DOSE REDUCTION: This exam was performed according to the departmental dose-optimization program which includes automated exposure control, adjustment of the mA and/or kV according to patient size and/or use of iterative reconstruction technique. COMPARISON:  PET/CT 12/13/2019 and MRI cervical spine 12/10/2020 FINDINGS: CT HEAD FINDINGS Brain: No intracranial hemorrhage, mass effect, or evidence of acute infarct. No hydrocephalus. No extra-axial fluid collection. Vascular: No hyperdense vessel. Intracranial arterial calcification. Skull: No fracture or focal lesion. Sinuses/Orbits: No acute finding. Paranasal sinuses and mastoid air cells are well  aerated. Other: None. CT CERVICAL SPINE FINDINGS Alignment: No evidence of traumatic malalignment. Skull base and vertebrae: No acute fracture. No primary bone lesion or focal pathologic process. Soft tissues and spinal canal: No prevertebral fluid or swelling. No visible canal hematoma. Disc levels: Posterior fusion C3-C4. Multilevel advanced spondylosis, displaced height loss, degenerative endplate changes. Fusion of anterior osteophytes. 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 This causes moderate spinal canal narrowing. Uncovertebral spurring and facet arthropathy cause multilevel moderate neural foraminal narrowing. Upper chest: No acute abnormality. Other: Carotid calcification IMPRESSION: 1. No acute intracranial abnormality. 2. No acute fracture in the cervical spine. Multilevel degenerative spondylosis. Electronically Signed   By: Minerva Fester M.D.   On: 11/11/2022 22:36   CT Head Wo Contrast  Result Date: 11/11/2022 CLINICAL DATA:  Crush injury. Lawnmower rolled backwards on landed on his chest. EXAM: CT HEAD WITHOUT CONTRAST CT CERVICAL SPINE WITHOUT CONTRAST TECHNIQUE: Multidetector CT imaging of the head and cervical spine was performed following the standard protocol without intravenous contrast. Multiplanar CT image reconstructions of the cervical spine were also generated. RADIATION DOSE REDUCTION: This exam was performed according to the departmental dose-optimization program which includes automated exposure control, adjustment of the mA and/or kV according to patient size and/or use of iterative reconstruction technique. COMPARISON:  PET/CT 12/13/2019 and MRI cervical spine 12/10/2020 FINDINGS: CT HEAD FINDINGS Brain: No intracranial hemorrhage, mass effect, or evidence of acute infarct. No hydrocephalus.  No extra-axial fluid collection. Vascular: No hyperdense vessel. Intracranial arterial calcification. Skull: No fracture or focal lesion. Sinuses/Orbits: No acute  finding. Paranasal sinuses and mastoid air cells are well aerated. Other: None. CT CERVICAL SPINE FINDINGS Alignment: No evidence of traumatic malalignment. Skull base and vertebrae: No acute fracture. No primary bone lesion or focal pathologic process. Soft tissues and spinal canal: No prevertebral fluid or swelling. No visible canal hematoma. Disc levels: Posterior fusion C3-C4. Multilevel advanced spondylosis, displaced height loss, degenerative endplate changes. Fusion of anterior osteophytes. 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 This causes moderate spinal canal narrowing. Uncovertebral spurring and facet arthropathy cause multilevel moderate neural foraminal narrowing. Upper chest: No acute abnormality. Other: Carotid calcification IMPRESSION: 1. No acute intracranial abnormality. 2. No acute fracture in the cervical spine. Multilevel degenerative spondylosis. Electronically Signed   By: Minerva Fester M.D.   On: 11/11/2022 22:36   CT L-SPINE NO CHARGE  Result Date: 11/11/2022 CLINICAL DATA:  Crush injury, lawnmower accident. EXAM: CT LUMBAR SPINE WITHOUT CONTRAST TECHNIQUE: Multidetector CT imaging of the lumbar spine was performed without intravenous contrast administration. Multiplanar CT image reconstructions were also generated. RADIATION DOSE REDUCTION: This exam was performed according to the departmental dose-optimization program which includes automated exposure control, adjustment of the mA and/or kV according to patient size and/or use of iterative reconstruction technique. COMPARISON:  Lumbar radiograph 11/10/2020 FINDINGS: Segmentation: 5 lumbar type vertebrae. Alignment: Normal. Vertebrae: No acute fracture. Vertebral body heights are normal. The posterior elements are intact. Flowing anterior osteophytes throughout the lumbar spine. Paraspinal and other soft tissues: Assessed on concurrent abdominopelvic CT, reported separately. No lumbar paraspinal hematoma. Disc  levels: Diffuse flowing anterior osteophytes and syndesmophytes. The disc spaces are relatively well preserved. There is mild posterior spurring at L5-S1. Moderate diffuse facet hypertrophy. Spinal canal stenosis at L2-L3, L3-L4, and L4-L5. prior right L5 laminectomy. IMPRESSION: 1. No acute fracture or subluxation of the lumbar spine. 2. Flowing anterior osteophytes and syndesmophytes throughout the lumbar spine may represent ankylosis or diffuse idiopathic skeletal hyperostosis. 3. Spinal canal stenosis at L2-L3, L3-L4 and L4-L5. Electronically Signed   By: Narda Rutherford M.D.   On: 11/11/2022 22:32   DG Chest Port 1 View  Result Date: 11/11/2022 CLINICAL DATA:  Status post trauma. EXAM: PORTABLE CHEST 1 VIEW COMPARISON:  December 27, 2007 FINDINGS: The heart size and mediastinal contours are within normal limits. There is marked severity calcification of the aortic arch. Low lung volumes are noted. Both lungs are clear. Multilevel degenerative changes seen throughout the thoracic spine. IMPRESSION: Low lung volumes without active cardiopulmonary disease. Electronically Signed   By: Aram Candela M.D.   On: 11/11/2022 22:13   DG Elbow 2 Views Left  Result Date: 11/11/2022 CLINICAL DATA:  Status post trauma. EXAM: LEFT ELBOW - 2 VIEW COMPARISON:  None Available. FINDINGS: A small linear lucency of indeterminate age is seen involving the lateral aspect of the left radial head. There is no evidence of dislocation chronic and degenerative changes are seen involving the medial epicondyle of the distal left humerus and volar aspect of the proximal left radius. Mild to moderate severity bony spurring is seen involving the left olecranon process. Soft tissues are unremarkable. IMPRESSION: Findings which may represent a small left radial head fracture of indeterminate age. Correlation with physical examination is recommended to determine the presence of point tenderness. Additional CT evaluation is recommended if  acute fracture remains of clinical concern. Electronically Signed   By: Demetrius Revel.D.  On: 11/11/2022 22:12     Positive ROS: All other systems have been reviewed and were otherwise negative with the exception of those mentioned in the HPI and as above.  Physical Exam: BP (!) 146/71 (BP Location: Right Arm)   Pulse 91   Temp 98.5 F (36.9 C) (Oral)   Resp 18   Ht 5\' 6"  (1.676 m)   Wt 119.8 kg   SpO2 95%   BMI 42.63 kg/m  General:  Alert, no acute distress Psychiatric:  Patient is competent for consent with normal mood and affect     Orthopedic Exam:  LUE: +ain/pin/u motor SILT r/u/m/ax +rad pulse Splint removed and skin is intact without any significant breaks in the skin about elbow. RoM elbow: Active range of motion from 0-130, able to supinate/pronate 70/70 without significant pain. No tenderness to palpation directly over the radial head.  No apparent elbow joint effusion.  Brief secondary survey was performed of his bilateral lower extremities over the right upper extremity as well.  No significant concern for acute fracture.  He does have known baseline degenerative changes to the knees and ankles.  X-rays:  As above: Finding of possible nondisplaced radial head fracture on radiographs.  This does not seem suspicious on my personal read for acute fracture.  Additionally, there is no elbow joint effusion/fat pad sign noted.  Radiographs of the right knee and left ankle were also visualized.  There do not appear to be significant acute findings.  There are degenerative changes to both the right knee and left ankle.  Assessment/Plan: 72 year old male who yesterday underwent thoracic spine fusion in the setting of DISH after a riding lawn more rolled back over his lower extremities and back.  There is concern for radial head fracture, but this does is physical exam is not consistent with this finding. 1.  Patient may weight-bear as tolerated and use the left upper  extremity as tolerated.  2.  PT/OT  3.  No specific need for orthopedic follow-up unless he wishes for further evaluation for his degenerative changes to the knee.  Will follow peripherally while he is an inpatient.  Please page with any further questions.    Signa Kell   11/13/2022 8:53 AM

## 2022-11-13 NOTE — TOC Initial Note (Addendum)
Transition of Care [facility]) - Initial/Assessment Note    Patient Details  Name: Alexander Wilcox MRN: [id number] Date of Birth: June 20, 1951  [facility] CM/SW Contact:    Liliana Cline, LCSW Phone Number: 11/13/2022, 4:53 PM  Clinical Narrative:                 Patient is from home with wife. PT is recommending SNF. Spoke to patient's wife via phone. She states they are agreeable to SNF. She states she prefers to take patient home with home health, however at his current status she does not feel she could meet his needs at home until he completes short term rehab. She stated if he does improve prior to DC, she would like to take him home with home health.   Prefers [facility] or [address]. States she does not want [facility] or [facility]. Explained [facility] does not accept [employer] (confirmed with Rep Sue Lush) and [facility] does not take patient's outside of their community. Provided Medicare Care Compare website information. Patient's wife states they may be interested in [facility], she plans to do more research and will let CSW know other preferred SNFs.  SNF workup started.   Expected Discharge Plan: Skilled Nursing Facility Barriers to Discharge: Continued Medical Work up   Patient Goals and CMS Choice Patient states their goals for this hospitalization and ongoing recovery are:: SNF CMS Medicare.gov Compare Post Acute Care list provided to:: Patient Represenative (must comment) Choice offered to / list presented to : Spouse      Expected Discharge Plan and Services       Living arrangements for the past 2 months: Single Family Home                                      Prior Living Arrangements/Services Living arrangements for the past 2 months: Single Family Home Lives with:: Spouse Patient language and need for interpreter reviewed:: Yes Do you feel safe going back to the place where you live?: Yes      Need for Family Participation in  Patient Care: Yes (Comment) Care giver support system in place?: Yes (comment)   Criminal Activity/Legal Involvement Pertinent to Current Situation/Hospitalization: No - Comment as needed  Activities of Daily Living Home Assistive Devices/Equipment: None ADL Screening (condition at time of admission) Patient's cognitive ability adequate to safely complete daily activities?: Yes Is the patient deaf or have difficulty hearing?: No Does the patient have difficulty seeing, even when wearing glasses/contacts?: No Does the patient have difficulty concentrating, remembering, or making decisions?: No Patient able to express need for assistance with ADLs?: Yes Does the patient have difficulty dressing or bathing?: No Independently performs ADLs?: Yes (appropriate for developmental age) Does the patient have difficulty walking or climbing stairs?: No Weakness of Legs: None Weakness of Arms/Hands: None  Permission Sought/Granted Permission sought to share information with : Oceanographer granted to share information with : Yes, Verbal Permission Granted              Emotional Assessment       Orientation: : Oriented to Self, Oriented to Situation, Oriented to Place, Oriented to  Time Alcohol / Substance Use: Not Applicable Psych Involvement: No (comment)  Admission diagnosis:  Closed T9 spinal fracture (HCC) [S22.079A] Blunt trauma [T14.90XA] Abdominal injury, initial encounter [S39.91XA] Closed nondisplaced fracture of head of left radius, initial  encounter [S52.125A] Closed fracture of thoracic vertebra, unspecified fracture morphology, unspecified thoracic vertebral level, initial encounter (HCC) [S22.009A] Patient Active Problem List   Diagnosis Date Noted   Hyponatremia 11/13/2022   Closed T9 spinal fracture (HCC) 11/12/2022   Prostate cancer (HCC) 11/12/2022   Chronic back pain 11/12/2022   Radial head fracture, closed 11/12/2022   Transaminitis  11/12/2022   Leukocytosis 11/12/2022   Thoracic spine instability 11/12/2022   Closed tricolumnar fracture of thoracic vertebra (HCC) 11/12/2022   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 11/12/2022   Fx dorsal vertebra-closed (HCC) 11/12/2022   Facial droop 11/12/2022   Diabetic peripheral neuropathy (HCC) 01/21/2021   Elevated hemoglobin A1c 01/21/2021   Uncontrolled type 2 diabetes mellitus with hyperglycemia, with long-term current use of insulin (HCC) 01/21/2021   Cervical cord myelomalacia (HCC) 01/21/2021   Chronic pain syndrome 11/10/2020   Pharmacologic therapy 11/10/2020   Disorder of skeletal system 11/10/2020   Problems influencing health status 11/10/2020   Abnormal MRI, cervical spine (12/10/2020) 11/10/2020   Abnormal CT scan, cervical spine (06/17/2014) 11/10/2020   Abnormal MRI, lumbar spine (02/05/2013) 11/10/2020   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   History of lumbar surgery 11/10/2020   Cervical spondylosis with myelopathy and radiculopathy 11/10/2020   Cervical radiculitis (intermittent) 11/10/2020   Chronic upper extremity pain (Bilateral) (intermittent) 11/10/2020   Chronic lower extremity pain (Bilateral) (intermittent) 11/10/2020   Gastroesophageal reflux disease without esophagitis 06/24/2020   B12 deficiency 01/10/2020   Disease related peripheral neuropathy 01/10/2020   History of rheumatoid arthritis 12/19/2019   Medicare annual wellness visit, initial 09/18/2019   Mild concentric left ventricular hypertrophy (LVH) 02/13/2019   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  12/07/2016   History of hypertension 12/07/2016   History of prostate cancer 12/07/2016   Plantar fasciitis 12/07/2016   Elevated LFTs 12/07/2016   DDD (degenerative disc disease), lumbar s/p fusion  12/07/2016   DDD (degenerative disc disease), thoracic 12/07/2016   DDD (degenerative disc disease), cervical s/p fusion  12/07/2016   Osteoarthritis of hands  (Bilateral) 12/07/2016   Osteoarthritis of knees (Bilateral) 12/07/2016   Osteoarthritis of feet (Bilateral) 12/07/2016   Rheumatoid factor positive 12/07/2016   History of sleep apnea 12/07/2016   Hyperuricemia 12/07/2016   Chronic right SI joint pain 12/07/2016   DISH (diffuse idiopathic skeletal hyperostosis) 08/05/2016   History of fusion of cervical spine 08/05/2016   Mixed hyperlipidemia 07/01/2016   Type 2 diabetes mellitus with hyperlipidemia (HCC) 06/23/2015   Essential hypertension 05/22/2014   Obesity, Class III, BMI 40-49.9 (morbid obesity) (HCC) 05/22/2014   OSA on CPAP 05/22/2014   PCP:  Marisue Ivan, MD Pharmacy:   [facility] [address], [place] - [address] [place] [address] Phone: [phone] Fax: [phone]     Social Determinants of Health (SDOH) Social History: SDOH Screenings   Food Insecurity: No Food Insecurity (11/12/2022)  Housing: Low Risk  (11/12/2022)  Transportation Needs: No Transportation Needs (11/12/2022)  Utilities: Not At Risk (11/12/2022)  Tobacco Use: Medium Risk (11/12/2022)   SDOH Interventions: Housing Interventions: Patient Refused   Readmission Risk Interventions     No data to display

## 2022-11-13 NOTE — Evaluation (Signed)
Occupational Therapy Evaluation Patient Details Name: Alexander Wilcox MRN: [id number] DOB: 12-Apr-1951 Today's Date: 11/13/2022   History of Present Illness Pt admitted to [facility] on 11/11/22 for c/o crush injury; riding lawn mower rolled backwards and landed on his chest for about 2-3 minutes. Pt endorses hitting his head on the ground, but no LOC, bil rib pain, L ankle pain, and R knee pain. Imaging significant for T9-10 fracture, T8-9 anterior longitudinal ligament tear,  and L radial head nondisplaced fracture. S/p surgical fixation with T7-12 posterior spinal instrumentation and fusion with ORIF on 5/10. Concern for stroke after surgery due to c/o dysarthria and facial droop; imaging negative and pt not a candidate for tPA due to recent major surgery. Significant PMH includes: arthritis, diabetes, HTN, prostate CA, sleep apnea, cLBP, T2DM with neuropathy, HLD, cervical myelopathy (s/p cervical fusion).   Clinical Impression   Pt received with HOB raised. Due to high pain and high level of assist required with PT, OT engaged pt in bed level evaluation only. UE functional; pt reporting pain with use of UE (in back and ribs); education provided for pt to self-feed and self-groom at bed level (as pt repots wife was feeding him earlier). Patient will benefit from continued OT while in acute care.       Recommendations for follow up therapy are one component of a multi-disciplinary discharge planning process, led by the attending physician.  Recommendations may be updated based on patient status, additional functional criteria and insurance authorization.   Assistance Recommended at Discharge Frequent or constant Supervision/Assistance  Patient can return home with the following Two people to help with walking and/or transfers;Two people to help with bathing/dressing/bathroom;Assistance with cooking/housework;Direct supervision/assist for financial management;Direct supervision/assist for medications  management;Assist for transportation;Help with stairs or ramp for entrance    Functional Status Assessment  Patient has had a recent decline in their functional status and demonstrates the ability to make significant improvements in function in a reasonable and predictable amount of time.  Equipment Recommendations  Other (comment) (defer to next venue of care)    Recommendations for Other Services       Precautions / Restrictions Precautions Precautions: Back;Fall Restrictions Weight Bearing Restrictions: No Other Position/Activity Restrictions: MD confirmed no R wrist fx present. Bruised/painful ribs and back; s/p back surgery      Mobility Bed Mobility               General bed mobility comments: Bed mobility deferred at this time 2/2 pt being total assist with PT earlier today with increase in pain and MAX A x2 to attempt standing without success.    Transfers                   General transfer comment: deferred      Balance                                           ADL either performed or assessed with clinical judgement   ADL Overall ADL's : Needs assistance/impaired                                       General ADL Comments: Pt stating that moving his arms makes his back and ribs hurt worse and that wife was feeding him lunch  earlier. After UE assessment that arms are functional, OT provided education to pt and encouragement to self-feed instead of relying on others, as well as self grooming bed level. Pt acknowledged. Pt is total assist with toileting (bed level), LB bathing, LB dressing bed level; unable to safely transfer with PT earlier today when attempting to stand even with +2 assist. Deferred attempts to t/f or bed mobility at this time 2/2 pt's pain and high level of assist required.     Vision Patient Visual Report: No change from baseline       Perception     Praxis      Pertinent Vitals/Pain Pain  Assessment Pain Assessment: 0-10 Pain Score: 8  Pain Location: back and ribs while laying in the bed with HOB elevated Pain Descriptors / Indicators: Aching, Grimacing Pain Intervention(s): Limited activity within patient's tolerance, Monitored during session     Hand Dominance     Extremity/Trunk Assessment Upper Extremity Assessment Upper Extremity Assessment: Generalized weakness (No MMT due to UE pain with AROM, but pt able to perform AROM of shoulders, elbows, wrist, fingers. Pt denies sensation changes beyond baseline neuropathy (intermittent "shooting" pains, as he describes).)   Lower Extremity Assessment Lower Extremity Assessment: Defer to PT evaluation       Communication Communication Communication: No difficulties   Cognition Arousal/Alertness: Awake/alert Behavior During Therapy: WFL for tasks assessed/performed Overall Cognitive Status: Within Functional Limits for tasks assessed                                 General Comments: Pt able to provide all info and follow commands. Does keep eyes closed most of the time 2/2 pain, but is fully alert.     General Comments  wound vac in place. Pt used UE on bed rails to slightly readjust trunk in bed during session; HOB raised throughout session.    Exercises     Shoulder Instructions      Home Living Family/patient expects to be discharged to:: Private residence Living Arrangements: Spouse/significant other Available Help at Discharge: Family;Available 24 hours/day Type of Home: House Home Access: Stairs to enter [employer] of Steps: garage* 4-5 Entrance Stairs-Rails: Right Home Layout: One level     Bathroom Shower/Tub: Producer, television/film/video: Handicapped height     Home Equipment: Wheelchair - manual   Additional Comments: Pt states he was currently in process of renov[place] a bathroom [place]      Prior Functioning/Environment Prior  Level of Function : Independent/Modified Independent             Mobility Comments: (I). No AD. Chronic back pain, but not causing any change in functional status; states back pain is a 0-1 at baseline, and maybe a 3/10 if going over a pothole on a motorcycle. ADLs Comments: (I). Pt is retired from the [employer] 15 years ago. Used to enjoys motorcylcing. Currently [place], and remodeling his bathrooms.        OT Problem List:        OT Treatment/Interventions: Self-care/ADL training;Therapeutic exercise;Therapeutic activities;DME and/or AE instruction;Patient/family education    OT Goals(Current goals can be found in the care plan section) Acute Rehab OT Goals Patient Stated Goal: Get better; pain to improve OT Goal Formulation: With patient Time For Goal Achievement: 11/27/22 Potential to Achieve Goals: Fair  ADL Goals Pt Will Perform Eating: bed level;with set-up Pt Will Perform Grooming: with set-up;bed level Pt Will Perform Lower Body Bathing: with min assist;sit to/from stand Pt Will Perform Lower Body Dressing: with min assist;with adaptive equipment;sit to/from stand Pt Will Transfer to Toilet: with mod assist;bedside commode;stand pivot transfer  OT Frequency: Min 1X/week    Co-evaluation              AM-PAC OT "6 Clicks" Daily Activity     Outcome Measure Help from another person eating meals?: None Help from another person taking care of personal grooming?: None Help from another person toileting, which includes using toliet, bedpan, or urinal?: Total Help from another person bathing (including washing, rinsing, drying)?: Total Help from another person to put on and taking off regular upper body clothing?: A Lot Help from another person to put on and taking off regular lower body clothing?: Total 6 Click Score: 13   End of Session Nurse Communication: Mobility status  Activity Tolerance: Patient limited by  pain Patient left: in bed;with call bell/phone within reach;with bed alarm set  OT Visit Diagnosis: Muscle weakness (generalized) (M62.81);Pain                Time: 1610-9604 OT Time Calculation (min): 13 min Charges:  OT General Charges $OT Visit: 1 Visit OT Evaluation $OT Eval Moderate Complexity: 1 Mod  Alexander Lindahl Junie Panning, MS, OTR/L  Alvester Morin 11/13/2022, 4:15 PM

## 2022-11-13 NOTE — [place] FL2 (Signed)
[place] MEDICAID FL2 LEVEL OF CARE FORM     IDENTIFICATION  Patient Name: Alexander Wilcox Birthdate: April 13, 1951 Sex: male Admission Date (Current Location): 11/11/2022  [facility] and [place] Number:  Chiropodist and Address:  [facility], [address] [place], [place] [address]      Provider Number: [id number]  Attending Physician Name and Address:  Alford Highland, MD  Relative Name and Phone Number:  Peeters,Cynthia (Spouse) [phone] (Mobile)    Current Level of Care: Hospital Recommended Level of Care: Skilled Nursing Facility Prior Approval Number:    Date Approved/Denied:   PASRR Number: [id number]  Discharge Plan:      Current Diagnoses: Patient Active Problem List   Diagnosis Date Noted   Hyponatremia 11/13/2022   Closed T9 spinal fracture (HCC) 11/12/2022   Prostate cancer (HCC) 11/12/2022   Chronic back pain 11/12/2022   Radial head fracture, closed 11/12/2022   Transaminitis 11/12/2022   Leukocytosis 11/12/2022   Thoracic spine instability 11/12/2022   Closed tricolumnar fracture of thoracic vertebra (HCC) 11/12/2022   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 11/12/2022   Fx dorsal vertebra-closed (HCC) 11/12/2022   Facial droop 11/12/2022   Diabetic peripheral neuropathy (HCC) 01/21/2021   Elevated hemoglobin A1c 01/21/2021   Uncontrolled type 2 diabetes mellitus with hyperglycemia, with long-term current use of insulin (HCC) 01/21/2021   Cervical cord myelomalacia (HCC) 01/21/2021   Chronic pain syndrome 11/10/2020   Pharmacologic therapy 11/10/2020   Disorder of skeletal system 11/10/2020   Problems influencing health status 11/10/2020   Abnormal MRI, cervical spine (12/10/2020) 11/10/2020   Abnormal CT scan, cervical spine (06/17/2014) 11/10/2020   Abnormal MRI, lumbar spine (02/05/2013) 11/10/2020   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   History of lumbar surgery 11/10/2020    Cervical spondylosis with myelopathy and radiculopathy 11/10/2020   Cervical radiculitis (intermittent) 11/10/2020   Chronic upper extremity pain (Bilateral) (intermittent) 11/10/2020   Chronic lower extremity pain (Bilateral) (intermittent) 11/10/2020   Gastroesophageal reflux disease without esophagitis 06/24/2020   B12 deficiency 01/10/2020   Disease related peripheral neuropathy 01/10/2020   History of rheumatoid arthritis 12/19/2019   Medicare annual wellness visit, initial 09/18/2019   Mild concentric left ventricular hypertrophy (LVH) 02/13/2019   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  12/07/2016   History of hypertension 12/07/2016   History of prostate cancer 12/07/2016   Plantar fasciitis 12/07/2016   Elevated LFTs 12/07/2016   DDD (degenerative disc disease), lumbar s/p fusion  12/07/2016   DDD (degenerative disc disease), thoracic 12/07/2016   DDD (degenerative disc disease), cervical s/p fusion  12/07/2016   Osteoarthritis of hands (Bilateral) 12/07/2016   Osteoarthritis of knees (Bilateral) 12/07/2016   Osteoarthritis of feet (Bilateral) 12/07/2016   Rheumatoid factor positive 12/07/2016   History of sleep apnea 12/07/2016   Hyperuricemia 12/07/2016   Chronic right SI joint pain 12/07/2016   DISH (diffuse idiopathic skeletal hyperostosis) 08/05/2016   History of fusion of cervical spine 08/05/2016   Mixed hyperlipidemia 07/01/2016   Type 2 diabetes mellitus with hyperlipidemia (HCC) 06/23/2015   Essential hypertension 05/22/2014   Obesity, Class III, BMI 40-49.9 (morbid obesity) (HCC) 05/22/2014   OSA on CPAP 05/22/2014    Orientation RESPIRATION BLADDER Height & Weight     Self, Time, Situation, Place  Normal Indwelling catheter, Continent Weight: 264 lb 1.8 oz (119.8 kg) Height:  5\' 6"  (167.6 cm)  BEHAVIORAL SYMPTOMS/MOOD NEUROLOGICAL BOWEL NUTRITION STATUS      Continent Diet (heart)  AMBULATORY STATUS  COMMUNICATION OF NEEDS Skin     Verbally Wound  Vac                       Personal Care Assistance Level of Assistance  Bathing, Feeding, Dressing Bathing Assistance: Maximum assistance Feeding assistance: Limited assistance Dressing Assistance: Maximum assistance     Functional Limitations Info             SPECIAL CARE FACTORS FREQUENCY  PT (By licensed PT), OT (By licensed OT)     PT Frequency: 5 times per week OT Frequency: 5 times per week            Contractures      Additional Factors Info  Code Status, Allergies Code Status Info: full Allergies Info: dilaudid (hydromorphone hcl)           Current Medications (11/13/2022):  This is the current hospital active medication list Current Facility-Administered Medications  Medication Dose Route Frequency Provider Last Rate Last Admin   0.9 %  sodium chloride infusion   Intravenous Continuous Alford Highland, MD 50 mL/hr at 11/12/22 1336 New Bag at 11/12/22 1336   acetaminophen (TYLENOL) tablet 650 mg  650 mg Oral Q6H PRN Susanne Borders, PA       Or   acetaminophen (TYLENOL) suppository 650 mg  650 mg Rectal Q6H PRN Susanne Borders, PA       aspirin EC tablet 81 mg  81 mg Oral Daily Alford Highland, MD   81 mg at 11/13/22 0911   atorvastatin (LIPITOR) tablet 40 mg  40 mg Oral Daily Alford Highland, MD   40 mg at 11/13/22 0981   cyanocobalamin (VITAMIN B12) tablet 1,000 mcg  1,000 mcg Oral Daily Alford Highland, MD   1,000 mcg at 11/13/22 0911   diphenhydrAMINE (BENADRYL) capsule 25 mg  25 mg Oral Q8H PRN Alford Highland, MD   25 mg at 11/12/22 1640   DULoxetine (CYMBALTA) DR capsule 30 mg  30 mg Oral Daily Alford Highland, MD   30 mg at 11/13/22 0911   fenofibrate tablet 160 mg  160 mg Oral Daily Alford Highland, MD   160 mg at 11/13/22 0911   gabapentin (NEURONTIN) capsule 800 mg  800 mg Oral TID Alford Highland, MD   800 mg at 11/13/22 1509   HYDROmorphone (DILAUDID) injection 0.5-1 mg  0.5-1 mg Intravenous Q2H PRN Susanne Borders, PA   1  mg at 11/13/22 1522   insulin aspart (novoLOG) injection 0-20 Units  0-20 Units Subcutaneous Q4H Susanne Borders, [place]   7 Units at 11/13/22 1521   insulin glargine-yfgn (SEMGLEE) injection 10 Units  10 Units Subcutaneous QHS Susanne Borders, [place]   10 Units at 11/12/22 2212   methocarbamol (ROBAXIN) tablet 750 mg  750 mg Oral Q6H PRN Susanne Borders, PA       metoprolol succinate (TOPROL-XL) 24 hr tablet 25 mg  25 mg Oral BID Alford Highland, MD   25 mg at 11/13/22 0911   nortriptyline (PAMELOR) capsule 50 mg  50 mg Oral QHS Alford Highland, MD   50 mg at 11/12/22 2212   ondansetron (ZOFRAN) tablet 4 mg  4 mg Oral Q6H PRN Susanne Borders, PA       Or   ondansetron [facility]) injection 4 mg  4 mg Intravenous Q6H PRN Susanne Borders, PA       oxyCODONE (Oxy IR/ROXICODONE) immediate release tablet 5 mg  5 mg Oral Q4H PRN  Susanne Borders, PA   5 mg at 11/13/22 1141   pantoprazole (PROTONIX) EC tablet 40 mg  40 mg Oral Daily Alford Highland, MD   40 mg at 11/13/22 0911   polyethylene glycol (MIRALAX / GLYCOLAX) packet 17 g  17 g Oral Daily Alford Highland, MD   17 g at 11/13/22 1010   tamsulosin (FLOMAX) capsule 0.4 mg  0.4 mg Oral Daily Alford Highland, MD   0.4 mg at 11/13/22 0911     Discharge Medications: Please see discharge summary for a list of discharge medications.  Relevant Imaging Results:  Relevant Lab Results:   Additional Information SS #: [id number]  Adjoa Althouse E Latiqua Daloia, LCSW

## 2022-11-13 NOTE — Progress Notes (Signed)
Attending Progress Note  History: Alexander Wilcox is here for thoracic spinal cord fusion.  Alexander Wilcox unfortunately suffered a thoracic spinal fracture.  Alexander Wilcox was taken to the OR yesterday for reduction and fixation.  POD 0: Developed left sided weakness without any sensation changes.  Neurology was consulted for possibility of stroke given some left-sided facial weakness as well.  Alexander Wilcox underwent a thoracic MRI as well as a brain MRI with no acute findings.  POD1: Continues to have back pain as well as rib pain throughout.  Alexander Wilcox has had improved lower extremity function over the night.  Continues to have some mild facial asymmetry, however difficulty concerning whether or not this is due to some swelling in his lips.  Physical Exam: Vitals:   11/12/22 2300 11/13/22 0418  BP: 131/68 134/70  Pulse: 90 92  Resp: 17 18  Temp: 98.7 F (37.1 C) 99.7 F (37.6 C)  SpO2: 100% 97%    AA Ox3 CNI, apparent left-sided facial asymmetry.  Evaluated by neurology.  Strength: Strength in left lower extremity is improving.  Alexander Wilcox has 4 out of 5 in dorsiflexion and plantarflexion, 3 out of 5 in EHL, 2 out of 5 in hip flexion and knee extension.  On the right side still 4+ out of 5 at least, somewhat pain limited.  Data:  Recent Labs  Lab 11/11/22 2152 11/12/22 0424  NA 134* 134*  K 4.2 3.8  CL 96* 100  CO2 22 23  BUN 30* 35*  CREATININE 1.41* 1.42*  GLUCOSE 273* 169*  CALCIUM 10.3 9.3   Recent Labs  Lab 11/12/22 0424  AST 39  ALT 44  ALKPHOS 73     Recent Labs  Lab 11/11/22 2152 11/12/22 0424  WBC 11.9* 7.1  HGB 15.9 13.9  HCT 46.5 40.4  PLT 202 161   Recent Labs  Lab 11/12/22 0135  INR 1.1         Other tests/results:  Narrative & Impression  CLINICAL DATA:  Facial droop   EXAM: MRI HEAD WITHOUT CONTRAST   TECHNIQUE: Multiplanar, multiecho pulse sequences of the brain and surrounding structures were obtained without intravenous contrast.   COMPARISON:  None Available.    FINDINGS: Brain: No acute infarct, mass effect or extra-axial collection. No acute or chronic hemorrhage. Normal white matter signal, parenchymal volume and CSF spaces. The midline structures are normal.   Vascular: Major flow voids are preserved.   Skull and upper cervical spine: Normal calvarium and skull base. Visualized upper cervical spine and soft tissues are normal.   Sinuses/Orbits:No paranasal sinus fluid levels or advanced mucosal thickening. No mastoid or middle ear effusion. Normal orbits.   IMPRESSION: Normal brain MRI.     Electronically Signed   By: Deatra Robinson M.D.   On: 11/12/2022 22:13   arrative & Impression  CLINICAL DATA:  Trauma   EXAM: MRI THORACIC SPINE WITHOUT CONTRAST   TECHNIQUE: Multiplanar, multisequence MR imaging of the thoracic spine was performed. No intravenous contrast was administered.   COMPARISON:  11/11/2022 thoracic spine CT and 11/12/2022 preoperative thoracic spine MRI   FINDINGS: Alignment:  Physiologic.   Vertebrae: There is now posterior instrumented fusion extending from T7-T12, traversing the site of T9-10 fracture. No new osseous abnormality.   Cord:  Normal signal and morphology.   Paraspinal and other soft tissues: Negative.   Disc levels:   Unchanged severe narrowing of the thecal sac at T6-7 and T7-8   IMPRESSION: Posterior instrumented fusion extending from T7-T12, traversing  the site of T9-10 fracture. Alignment is normal.     Electronically Signed   By: Deatra Robinson M.D.   On: 11/12/2022 22:11   Assessment/Plan:  Alexander Wilcox is a 72 year old man with a history of DISH arthritis.  Alexander Wilcox suffered a fall and developed a 3 column thoracic spinal fracture.  Alexander Wilcox was taken to the OR yesterday for open decompression and fusion with reduction of his fracture.  Immediately postoperatively Alexander Wilcox is doing well, however shortly thereafter developed left-sided lower extremity weakness without sensation changes.   Neurology was consulted for possibility of stroke given some mild facial asymmetry  In regards to his left lower extremity Alexander Wilcox continues to improve overnight.  His MRI demonstrated stable compression.  - mobilize - pain control - DVT prophylaxis - PTOT, bracing while out of bed. - Avoid hypotension, currently auto mapping for his spinal cord health.  Plan discussed directly   Alexander Kim, MD/MSCR Department of Neurosurgery

## 2022-11-13 NOTE — Assessment & Plan Note (Signed)
Sodium 132. ?

## 2022-11-13 NOTE — Plan of Care (Signed)

## 2022-11-13 NOTE — Progress Notes (Signed)
Progress Note   Patient: Alexander Wilcox:[id number] DOB: 1950/08/26 DOA: 11/11/2022     1 DOS: the patient was seen and examined on 11/13/2022   Brief hospital course: 72 y.o. male with medical history significant for HTN, insulin-dependent type 2 diabetes with neuropathy, OSA on CPAP, HLD, prostate cancer on leuprolide, failed back surgical syndrome, and cervical myelopathy s/p cervical fusion who presented to the ED for evaluation of acute back pain related to trauma when his riding lawn more rolled backwards off the trailer, knocking him down, landing on his torso and left hip and pinning him to the ground.  He hit his head but did not lose consciousness.  His wife had to power on to mower and drive it off of him.  He was able to get up and cautiously ambulate but he has been complaining of abdominal, chest wall, left elbow ankle and right knee pain.  He was previously in his usual state of health. ED course and data review: Vitals within normal limits.  Labs notable for hyperglycemia of 273 with anion gap 16 and bicarb 22., creatinine 1.41.  Mild transaminitis with AST 50 and ALT 57.  WBC 11,900 with otherwise normal CBC. Trauma imaging showed a fracture of T9 on CT imaging and a left radial head nondisplaced fracture.  No other injuries noted on pan scan The ED provider spoke with neurosurgeon, Dr. Marcell Barlow who will take patient to the OR on 5/10.  5/10.  Dr. Myer Haff did open reduction internal fixation of T9 fracture and posterolateral arthrodesis T7-T12.  Dr. Richardo Hanks urology placed a Foley catheter for bladder neck contracture requiring dilatation.  When I came from my evaluation patient's wife noticed a left facial droop and intermittent slurred speech.  Case discussed with neurology and a CT angio head and neck ordered.  Patient not a candidate for tPA since had major surgery today. 5/11.  MRI brain negative for stroke.  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  Will continue aspirin.  Strength improved from yesterday for his left leg.  Assessment and Plan: * Closed T9 spinal fracture (HCC) Chronic back pain/history of lumbar laminectomy and cervical fusion Neurosurgery took to the OR 5/10 for open reduction internal fixation of T9 fracture and posterolateral arthrodesis T7-T12.  Uncontrolled type 2 diabetes mellitus with hyperglycemia, with long-term current use of insulin (HCC) Diabetic neuropathy Hemoglobin A1c 7.5 basal insulin with sliding scale coverage Continue gabapentin, duloxetine.  Facial droop MRI negative for stroke.  Radial head fracture, closed Ruled out by orthopedic surgery.  Cleared to work with physical therapy with his left arm.   Hyponatremia Sodium 1 point less than normal range  Leukocytosis Reactive.  Improved  Transaminitis Improved  Prostate cancer (HCC) On leuprolide every 6 months  OSA on CPAP Continue CPAP  Obesity, Class III, BMI 40-49.9 (morbid obesity) (HCC) BMI 42.63  Essential hypertension Continue Toprol.        Subjective: Patient feels pain in his ribs and when he tries to take a deep breath.  Had emergent neurosurgery for trauma with a lawnmower falling on him.  Physical Exam: Vitals:   11/12/22 2004 11/12/22 2300 11/13/22 0418 11/13/22 0849  BP: 118/68 131/68 134/70 (!) 146/71  Pulse: 90 90 92 91  Resp: 18 17 18 18   Temp: 98.2 F (36.8 C) 98.7 F (37.1 C) 99.7 F (37.6 C) 98.5 F (36.9 C)  TempSrc:   Oral Oral  SpO2: 98% 100% 97% 95%  Weight:      Height:       Physical Exam HENT:     Head: Normocephalic.     Mouth/Throat:     Pharynx: No oropharyngeal exudate.  Eyes:     General: Lids are normal.     Conjunctiva/sclera: Conjunctivae normal.  Cardiovascular:     Rate and Rhythm: Normal rate and regular rhythm.     Heart sounds: Normal heart sounds, S1 normal and S2 normal.  Pulmonary:     Breath sounds: No decreased breath sounds,  wheezing, rhonchi or rales.  Abdominal:     Palpations: Abdomen is soft.     Tenderness: There is no abdominal tenderness.  Musculoskeletal:     Right lower leg: No swelling.     Left lower leg: No swelling.  Skin:    General: Skin is warm.     Findings: No rash.  Neurological:     Mental Status: He is alert and oriented to person, place, and time.     Comments: Able to straight leg raise bilateral legs today.     Data Reviewed: MRI brain negative for stroke.  CT angio does show occlusions in internal carotid, vertebral and basilar arteries.  MRI thoracic spine as per neurosurgeon stable.  Family Communication: Updated patient's wife on the phone  Disposition: Status is: Inpatient Remains inpatient appropriate because: Recovering a postoperative day 1 from thoracic spine urgent surgery secondary to trauma.  Planned Discharge Destination: Rehab    Time spent: 28 minutes Case discussed with neurosurgery.  Author: Alford Highland, MD 11/13/2022 1:14 PM  For on call review [email or web address].

## 2022-11-14 DIAGNOSIS — N179 Acute kidney failure, unspecified: Secondary | ICD-10-CM | POA: Insufficient documentation

## 2022-11-14 DIAGNOSIS — N320 Bladder-neck obstruction: Secondary | ICD-10-CM

## 2022-11-14 LAB — GLUCOSE, CAPILLARY
Glucose-Capillary: 168 mg/dL — ABNORMAL HIGH (ref 70–99)
Glucose-Capillary: 169 mg/dL — ABNORMAL HIGH (ref 70–99)
Glucose-Capillary: 171 mg/dL — ABNORMAL HIGH (ref 70–99)
Glucose-Capillary: 172 mg/dL — ABNORMAL HIGH (ref 70–99)
Glucose-Capillary: 182 mg/dL — ABNORMAL HIGH (ref 70–99)
Glucose-Capillary: 187 mg/dL — ABNORMAL HIGH (ref 70–99)
Glucose-Capillary: 189 mg/dL — ABNORMAL HIGH (ref 70–99)
Glucose-Capillary: 203 mg/dL — ABNORMAL HIGH (ref 70–99)

## 2022-11-14 LAB — BASIC METABOLIC PANEL
Anion gap: 7 (ref 5–15)
BUN: 17 mg/dL (ref 8–23)
CO2: 25 mmol/L (ref 22–32)
Calcium: 8.7 mg/dL — ABNORMAL LOW (ref 8.9–10.3)
Chloride: 100 mmol/L (ref 98–111)
Creatinine, Ser: 1.01 mg/dL (ref 0.61–1.24)
GFR, Estimated: >60 mL/min (ref >60)
Glucose, Bld: 188 mg/dL — ABNORMAL HIGH (ref 70–99)
Potassium: 3.7 mmol/L (ref 3.5–5.1)
Sodium: 132 mmol/L — ABNORMAL LOW (ref 135–145)

## 2022-11-14 LAB — CBC
HCT: 34.4 % — ABNORMAL LOW (ref 39.0–52.0)
Hemoglobin: 11.8 g/dL — ABNORMAL LOW (ref 13.0–17.0)
MCH: 30.6 pg (ref 26.0–34.0)
MCHC: 34.3 g/dL (ref 30.0–36.0)
MCV: 89.1 fL (ref 80.0–100.0)
Platelets: 154 10*3/uL (ref 150–400)
RBC: 3.86 MIL/uL — ABNORMAL LOW (ref 4.22–5.81)
RDW: 13 % (ref 11.5–15.5)
WBC: 8.1 10*3/uL (ref 4.0–10.5)
nRBC: 0 % (ref 0.0–0.2)

## 2022-11-14 MED ORDER — POLYETHYLENE GLYCOL 3350 17 G PO PACK
17.0000 g | PACK | Freq: Two times a day (BID) | ORAL | Status: DC
  Administered 2022-11-14 – 2022-11-15 (×3): 17 g via ORAL
  Filled 2022-11-14 (×4): qty 1

## 2022-11-14 MED ORDER — SENNA 8.6 MG PO TABS
1.0000 | ORAL_TABLET | Freq: Every day | ORAL | Status: DC
  Administered 2022-11-14 – 2022-11-15 (×2): 8.6 mg via ORAL
  Filled 2022-11-14 (×3): qty 1

## 2022-11-14 NOTE — Progress Notes (Signed)
Attending Progress Note  History: Alexander Wilcox is here for thoracic spinal cord fusion.  He unfortunately suffered a thoracic spinal fracture.  He was taken to the OR yesterday for reduction and fixation.  POD 0: Developed left sided weakness without any sensation changes.  Neurology was consulted for possibility of stroke given some left-sided facial weakness as well.  He underwent a thoracic MRI as well as a brain MRI with no acute findings.  POD1: Continues to have back pain as well as rib pain throughout.  He has had improved lower extremity function over the night.  Continues to have some mild facial asymmetry, however difficulty concerning whether or not this is due to some swelling in his lips.  POD 3: Continues to have back pain as expected.  Rib pain is stable.  He feels that his left lower extremity continues to be stable in its overall function.  He did not have any more episodes of complete loss of function in his left lower extremity yesterday.  Physical Exam: Vitals:   11/13/22 2119 11/14/22 0916  BP: (!) 141/66 137/61  Pulse: 90 78  Resp: 16 16  Temp: 99.6 F (37.6 C) 98.1 F (36.7 C)  SpO2: 93% 98%    AA Ox3 CNI, apparent left-sided facial asymmetry.  Evaluated by neurology.  Strength: Strength in left lower extremity is improving.  He has 4 out of 5 in dorsiflexion and plantarflexion, 3 out of 5 in EHL, 2 out of 5 in hip flexion and knee extension.  On the right side still 4+ out of 5 at least, somewhat pain limited.  Data:  Recent Labs  Lab 11/11/22 2152 11/12/22 0424 11/14/22 0514  NA 134* 134* 132*  K 4.2 3.8 3.7  CL 96* 100 100  CO2 22 23 25   BUN 30* 35* 17  CREATININE 1.41* 1.42* 1.01  GLUCOSE 273* 169* 188*  CALCIUM 10.3 9.3 8.7*   Recent Labs  Lab 11/12/22 0424  AST 39  ALT 44  ALKPHOS 73     Recent Labs  Lab 11/11/22 2152 11/12/22 0424 11/14/22 0514  WBC 11.9* 7.1 8.1  HGB 15.9 13.9 11.8*  HCT 46.5 40.4 34.4*  PLT 202 161 154    Recent Labs  Lab 11/12/22 0135  INR 1.1         Other tests/results:  Narrative & Impression  CLINICAL DATA:  Facial droop   EXAM: MRI HEAD WITHOUT CONTRAST   TECHNIQUE: Multiplanar, multiecho pulse sequences of the brain and surrounding structures were obtained without intravenous contrast.   COMPARISON:  None Available.   FINDINGS: Brain: No acute infarct, mass effect or extra-axial collection. No acute or chronic hemorrhage. Normal white matter signal, parenchymal volume and CSF spaces. The midline structures are normal.   Vascular: Major flow voids are preserved.   Skull and upper cervical spine: Normal calvarium and skull base. Visualized upper cervical spine and soft tissues are normal.   Sinuses/Orbits:No paranasal sinus fluid levels or advanced mucosal thickening. No mastoid or middle ear effusion. Normal orbits.   IMPRESSION: Normal brain MRI.     Electronically Signed   By: Deatra Robinson M.D.   On: 11/12/2022 22:13   arrative & Impression  CLINICAL DATA:  Trauma   EXAM: MRI THORACIC SPINE WITHOUT CONTRAST   TECHNIQUE: Multiplanar, multisequence MR imaging of the thoracic spine was performed. No intravenous contrast was administered.   COMPARISON:  11/11/2022 thoracic spine CT and 11/12/2022 preoperative thoracic spine MRI   FINDINGS: Alignment:  Physiologic.   Vertebrae: There is now posterior instrumented fusion extending from T7-T12, traversing the site of T9-10 fracture. No new osseous abnormality.   Cord:  Normal signal and morphology.   Paraspinal and other soft tissues: Negative.   Disc levels:   Unchanged severe narrowing of the thecal sac at T6-7 and T7-8   IMPRESSION: Posterior instrumented fusion extending from T7-T12, traversing the site of T9-10 fracture. Alignment is normal.     Electronically Signed   By: Deatra Robinson M.D.   On: 11/12/2022 22:11   Assessment/Plan:  Alexander Wilcox is a 72 year old man with  a history of DISH arthritis.  He suffered a fall and developed a 3 column thoracic spinal fracture.  He was taken to the OR for open decompression and fusion with reduction of his fracture.  Immediately postoperatively he is doing well, however shortly thereafter developed left-sided lower extremity weakness without sensation changes.  Neurology was consulted for possibility of stroke given some mild facial asymmetry In regards to his left lower extremity he continues to improve overnight.  His MRI demonstrated stable compression.  He has stable exam overnight and continues to have elevated blood pressures.  - mobilize - pain control - DVT prophylaxis - PTOT, bracing while out of bed. - Avoid hypotension, currently auto mapping for his spinal cord health.  Lovenia Kim, MD/MSCR Department of Neurosurgery

## 2022-11-14 NOTE — Plan of Care (Signed)

## 2022-11-14 NOTE — Assessment & Plan Note (Signed)
Creatinine 1.42 improved to 1.01 with IV fluids.

## 2022-11-14 NOTE — Progress Notes (Signed)
Progress Note   Patient: Alexander Wilcox UEA:[id number] DOB: Mar 24, 1951 DOA: 11/11/2022     2 DOS: the patient was seen and examined on 11/14/2022   Brief hospital course: 72 y.o. male with medical history significant for HTN, insulin-dependent type 2 diabetes with neuropathy, OSA on CPAP, HLD, prostate cancer on leuprolide, failed back surgical syndrome, and cervical myelopathy s/p cervical fusion who presented to the ED for evaluation of acute back pain related to trauma when his riding lawn more rolled backwards off the trailer, knocking him down, landing on his torso and left hip and pinning him to the ground.  He hit his head but did not lose consciousness.  His wife had to power on to mower and drive it off of him.  He was able to get up and cautiously ambulate but he has been complaining of abdominal, chest wall, left elbow ankle and right knee pain.  He was previously in his usual state of health. ED course and data review: Vitals within normal limits.  Labs notable for hyperglycemia of 273 with anion gap 16 and bicarb 22., creatinine 1.41.  Mild transaminitis with AST 50 and ALT 57.  WBC 11,900 with otherwise normal CBC. Trauma imaging showed a fracture of T9 on CT imaging and a left radial head nondisplaced fracture.  No other injuries noted on pan scan The ED provider spoke with neurosurgeon, Dr. Marcell Barlow who will take patient to the OR on 5/10.  5/10.  Dr. Myer Haff did open reduction internal fixation of T9 fracture and posterolateral arthrodesis T7-T12.  Dr. Richardo Hanks urology placed a Foley catheter for bladder neck contracture requiring dilatation.  When I came from my evaluation patient's wife noticed a left facial droop and intermittent slurred speech.  Case discussed with neurology and a CT angio head and neck ordered.  Patient not a candidate for tPA since had major surgery today. 5/11.  MRI brain negative for stroke.  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  Will continue aspirin.  Strength improved from yesterday for his left leg.  Assessment and Plan: * Closed T9 spinal fracture (HCC) Chronic back pain/history of lumbar laminectomy and cervical fusion Neurosurgery took to the OR 5/10 for open reduction internal fixation of T9 fracture and posterolateral arthrodesis T7-T12.  Continue PT and OT evaluations.  Patient will need rehab.  Pain control.  Uncontrolled type 2 diabetes mellitus with hyperglycemia, with long-term current use of insulin (HCC) Diabetic neuropathy Hemoglobin A1c 7.5 basal insulin with sliding scale coverage Continue gabapentin, duloxetine.  Facial droop MRI negative for stroke.  Essential hypertension Continue Toprol.  Bladder neck contracture Foley catheter placed in operating room by urology.  Recommend keeping him catheter for 5 to 10 days  Transaminitis Improved  OSA on CPAP Continue CPAP  Obesity, Class III, BMI 40-49.9 (morbid obesity) (HCC) BMI 42.63  Leukocytosis Reactive.  Improved  Prostate cancer (HCC) On leuprolide every 6 months  Radial head fracture, closed Ruled out by orthopedic surgery.  Cleared to work with physical therapy with his left arm.   AKI (acute kidney injury) (HCC) Creatinine 1.42 improved to 1.01 with IV fluids.  Hyponatremia Sodium 132        Subjective: Patient still having some pain in his ribs.  Physical Exam: Vitals:   11/13/22 0849 11/13/22 1659 11/13/22 2119 11/14/22 0916  BP: (!) 146/71 (!) 112/58 (!) 141/66 137/61  Pulse: 91 88 90 78  Resp: 18 18 16 16   Temp: 98.5  F (36.9 C) 100.1 F (37.8 C) 99.6 F (37.6 C)   TempSrc: Oral Oral    SpO2: 95% 95% 93%   Weight:      Height:       Physical Exam HENT:     Head: Normocephalic.     Mouth/Throat:     Pharynx: No oropharyngeal exudate.  Eyes:     General: Lids are normal.     Conjunctiva/sclera: Conjunctivae normal.  Cardiovascular:     Rate and Rhythm: Normal rate and  regular rhythm.     Heart sounds: Normal heart sounds, S1 normal and S2 normal.  Pulmonary:     Breath sounds: No decreased breath sounds, wheezing, rhonchi or rales.  Abdominal:     Palpations: Abdomen is soft.     Tenderness: There is no abdominal tenderness.  Musculoskeletal:     Right lower leg: No swelling.     Left lower leg: No swelling.  Skin:    General: Skin is warm.     Findings: No rash.  Neurological:     Mental Status: He is alert and oriented to person, place, and time.     Comments: Weakness with left leg straight leg raise today     Data Reviewed: Sodium 132, creatinine 1.04, hemoglobin 11.8, white blood cell count 8.1, platelet count 154  Family Communication: Spoke with wife at the bedside  Disposition: Status is: Inpatient Remains inpatient appropriate because: Patient will end up needing rehab.  Postoperative day 2  Planned Discharge Destination: Rehab    Time spent: 27 minutes  Author: Alford Highland, MD 11/14/2022 1:08 PM  For on call review [email or web address].

## 2022-11-14 NOTE — Assessment & Plan Note (Signed)
Foley catheter placed in operating room by urology.  Spoke with urology today and recommended follow-up in the clinic on 5/17 for Foley catheter removal.

## 2022-11-14 NOTE — TOC Progression Note (Addendum)
Transition of Care [facility]) - Progression Note    Patient Details  Name: MIKIAS NEUBER MRN: [id number] Date of Birth: 1950/08/20  [facility] of Care [facility]) CM/SW Contact  Liliana Cline, LCSW Phone Number: 11/14/2022, 8:52 AM  Clinical Narrative:    CSW reached out to [place] at [facility]. Requested she review referral. Awaiting response.   4:43- Call from Tiffany at [facility]. She states she will have to review referral tomorrow. Called and updated patient and spouse. They state they only want [facility] and are hopeful patient will be accepted. TOC handoff updated.    Expected Discharge Plan: Skilled Nursing Facility Barriers to Discharge: Continued Medical Work up  Expected Discharge Plan and Services       Living arrangements for the past 2 months: Single Family Home                                       Social Determinants of Health (SDOH) Interventions SDOH Screenings   Food Insecurity: No Food Insecurity (11/12/2022)  Housing: Low Risk  (11/12/2022)  Transportation Needs: No Transportation Needs (11/12/2022)  Utilities: Not At Risk (11/12/2022)  Tobacco Use: Medium Risk (11/12/2022)    Readmission Risk Interventions     No data to display

## 2022-11-15 ENCOUNTER — Encounter: Payer: Self-pay | Admitting: Neurosurgery

## 2022-11-15 DIAGNOSIS — R319 Hematuria, unspecified: Secondary | ICD-10-CM | POA: Insufficient documentation

## 2022-11-15 DIAGNOSIS — K5900 Constipation, unspecified: Secondary | ICD-10-CM | POA: Insufficient documentation

## 2022-11-15 DIAGNOSIS — N179 Acute kidney failure, unspecified: Secondary | ICD-10-CM

## 2022-11-15 LAB — CBC
HCT: 33.1 % — ABNORMAL LOW (ref 39.0–52.0)
Hemoglobin: 11.3 g/dL — ABNORMAL LOW (ref 13.0–17.0)
MCH: 30.1 pg (ref 26.0–34.0)
MCHC: 34.1 g/dL (ref 30.0–36.0)
MCV: 88 fL (ref 80.0–100.0)
Platelets: 150 10*3/uL (ref 150–400)
RBC: 3.76 MIL/uL — ABNORMAL LOW (ref 4.22–5.81)
RDW: 12.8 % (ref 11.5–15.5)
WBC: 7.6 10*3/uL (ref 4.0–10.5)
nRBC: 0 % (ref 0.0–0.2)

## 2022-11-15 LAB — GLUCOSE, CAPILLARY
Glucose-Capillary: 152 mg/dL — ABNORMAL HIGH (ref 70–99)
Glucose-Capillary: 170 mg/dL — ABNORMAL HIGH (ref 70–99)
Glucose-Capillary: 177 mg/dL — ABNORMAL HIGH (ref 70–99)
Glucose-Capillary: 181 mg/dL — ABNORMAL HIGH (ref 70–99)
Glucose-Capillary: 181 mg/dL — ABNORMAL HIGH (ref 70–99)
Glucose-Capillary: 182 mg/dL — ABNORMAL HIGH (ref 70–99)
Glucose-Capillary: 196 mg/dL — ABNORMAL HIGH (ref 70–99)

## 2022-11-15 MED ORDER — LACTULOSE 10 GM/15ML PO SOLN
30.0000 g | Freq: Two times a day (BID) | ORAL | Status: DC
  Administered 2022-11-15 (×2): 30 g via ORAL
  Filled 2022-11-15 (×2): qty 60

## 2022-11-15 MED ORDER — ENOXAPARIN SODIUM 60 MG/0.6ML IJ SOSY
0.5000 mg/kg | PREFILLED_SYRINGE | INTRAMUSCULAR | Status: DC
  Administered 2022-11-15 – 2022-11-18 (×4): 60 mg via SUBCUTANEOUS
  Filled 2022-11-15 (×4): qty 0.6

## 2022-11-15 MED ORDER — ORAL CARE MOUTH RINSE: 15.0000 mL | OROMUCOSAL | Status: DC | PRN

## 2022-11-15 MED ORDER — SODIUM CHLORIDE 0.9 % IV SOLN: INTRAVENOUS | Status: DC

## 2022-11-15 MED ORDER — CHLORHEXIDINE GLUCONATE CLOTH 2 % EX PADS
6.0000 | MEDICATED_PAD | Freq: Every day | CUTANEOUS | Status: DC
  Administered 2022-11-15 – 2022-11-18 (×4): 6 via TOPICAL

## 2022-11-15 MED ORDER — SORBITOL 70 % SOLN
960.0000 mL | TOPICAL_OIL | Freq: Once | ORAL | Status: AC
  Administered 2022-11-16: 960 mL via RECTAL
  Filled 2022-11-15: qty 240

## 2022-11-15 NOTE — Progress Notes (Addendum)
PT Cancellation Note  Patient Details Name: Alexander Wilcox MRN: [id number] DOB: 08/02/1950   Cancelled Treatment:    Reason Eval/Treat Not Completed: Other (comment);Pain limiting ability to participate/patient refusal. (He reports significant pain with just getting on/off bed pan and declined PT despite recently having pain medication. PT will continue with attempts.)  Donna Bernard, PT, MPT  Ina Homes 11/15/2022, 2:16 PM

## 2022-11-15 NOTE — Progress Notes (Addendum)
Attending Progress Note  History: Alexander Wilcox is here for thoracic spinal cord fusion.  He unfortunately suffered a thoracic spinal fracture.  He was taken to the OR yesterday for reduction and fixation.  POD 0: Developed left sided weakness without any sensation changes.  Neurology was consulted for possibility of stroke given some left-sided facial weakness as well.  He underwent a thoracic MRI as well as a brain MRI with no acute findings.  POD1: Continues to have back pain as well as rib pain throughout.  He has had improved lower extremity function over the night.  Continues to have some mild facial asymmetry, however difficulty concerning whether or not this is due to some swelling in his lips.  POD 3: Continues to have back pain as expected.  Rib pain is stable.  He feels that his left lower extremity continues to be stable in its overall function.  He did not have any more episodes of complete loss of function in his left lower extremity yesterday.  POD4: Pt attempting to use the bedpan  Physical Exam: Vitals:   11/14/22 2324 11/15/22 0733  BP: 130/70 119/66  Pulse: 85 74  Resp: 14 17  Temp: 98.6 F (37 C) (!) 97.5 F (36.4 C)  SpO2: 96% 97%    AA Ox3 CNI, continued left-sided facial asymmetry.   Strength: 4/5 DF, PF, 3/5 EHL, 2/5 HF and KE on the left. 4+/5 throughout on the right  Incision covered with incisional wound vac  Data:  Recent Labs  Lab 11/11/22 2152 11/12/22 0424 11/14/22 0514  NA 134* 134* 132*  K 4.2 3.8 3.7  CL 96* 100 100  CO2 22 23 25   BUN 30* 35* 17  CREATININE 1.41* 1.42* 1.01  GLUCOSE 273* 169* 188*  CALCIUM 10.3 9.3 8.7*    Recent Labs  Lab 11/12/22 0424  AST 39  ALT 44  ALKPHOS 73      Recent Labs  Lab 11/12/22 0424 11/14/22 0514 11/15/22 0420  WBC 7.1 8.1 7.6  HGB 13.9 11.8* 11.3*  HCT 40.4 34.4* 33.1*  PLT 161 154 150    Recent Labs  Lab 11/12/22 0135  INR 1.1          Other tests/results:  Narrative  & Impression  CLINICAL DATA:  Facial droop   EXAM: MRI HEAD WITHOUT CONTRAST   TECHNIQUE: Multiplanar, multiecho pulse sequences of the brain and surrounding structures were obtained without intravenous contrast.   COMPARISON:  None Available.   FINDINGS: Brain: No acute infarct, mass effect or extra-axial collection. No acute or chronic hemorrhage. Normal white matter signal, parenchymal volume and CSF spaces. The midline structures are normal.   Vascular: Major flow voids are preserved.   Skull and upper cervical spine: Normal calvarium and skull base. Visualized upper cervical spine and soft tissues are normal.   Sinuses/Orbits:No paranasal sinus fluid levels or advanced mucosal thickening. No mastoid or middle ear effusion. Normal orbits.   IMPRESSION: Normal brain MRI.     Electronically Signed   By: Deatra Robinson M.D.   On: 11/12/2022 22:13   arrative & Impression  CLINICAL DATA:  Trauma   EXAM: MRI THORACIC SPINE WITHOUT CONTRAST   TECHNIQUE: Multiplanar, multisequence MR imaging of the thoracic spine was performed. No intravenous contrast was administered.   COMPARISON:  11/11/2022 thoracic spine CT and 11/12/2022 preoperative thoracic spine MRI   FINDINGS: Alignment:  Physiologic.   Vertebrae: There is now posterior instrumented fusion extending from T7-T12, traversing the  site of T9-10 fracture. No new osseous abnormality.   Cord:  Normal signal and morphology.   Paraspinal and other soft tissues: Negative.   Disc levels:   Unchanged severe narrowing of the thecal sac at T6-7 and T7-8   IMPRESSION: Posterior instrumented fusion extending from T7-T12, traversing the site of T9-10 fracture. Alignment is normal.     Electronically Signed   By: Deatra Robinson M.D.   On: 11/12/2022 22:11   Assessment/Plan:  Alexander Wilcox is a 72 year old man with a history of DISH arthritis.  He suffered a fall and developed a 3 column thoracic spinal  fracture.  He was taken to the OR for open decompression and fusion with reduction of his fracture.  - mobilize - pain control - DVT prophylaxis - PTOT, bracing while out of bed. - Avoid hypotension, currently auto mapping for his spinal cord health. - Will continue wound vac for now with plans to remove prior to discharge  Manning Charity PA-C Department of Neurosurgery   Addendum to above.  I have seen and examined [name].  He is stable and slowly improving neurologically.  Will work on a bowel movement.

## 2022-11-15 NOTE — Progress Notes (Signed)
Progress Note   Patient: Alexander Wilcox RUE:[id number] DOB: [birth year] DOA: 11/11/2022     3 DOS: the patient was seen and examined on 11/15/2022   Brief hospital course: 72 y.o. male with medical history significant for HTN, insulin-dependent type 2 diabetes with neuropathy, OSA on CPAP, HLD, prostate cancer on leuprolide, failed back surgical syndrome, and cervical myelopathy s/p cervical fusion who presented to the ED for evaluation of acute back pain related to trauma when his riding lawn more rolled backwards off the trailer, knocking him down, landing on his torso and left hip and pinning him to the ground.  He hit his head but did not lose consciousness.  His wife had to power on to mower and drive it off of him.  He was able to get up and cautiously ambulate but he has been complaining of abdominal, chest wall, left elbow ankle and right knee pain.  He was previously in his usual state of health. ED course and data review: Vitals within normal limits.  Labs notable for hyperglycemia of 273 with anion gap 16 and bicarb 22., creatinine 1.41.  Mild transaminitis with AST 50 and ALT 57.  WBC 11,900 with otherwise normal CBC. Trauma imaging showed a fracture of T9 on CT imaging and a left radial head nondisplaced fracture.  No other injuries noted on pan scan The ED provider spoke with neurosurgeon, Dr. Marcell Barlow who will take patient to the OR on 5/10.  5/10.  Dr. Myer Haff did open reduction internal fixation of T9 fracture and posterolateral arthrodesis T7-T12.  Dr. Richardo Hanks urology placed a Foley catheter for bladder neck contracture requiring dilatation.  When I came from my evaluation patient's wife noticed a left facial droop and intermittent slurred speech.  Case discussed with neurology and a CT angio head and neck ordered.  Patient not a candidate for tPA since had major surgery today. 5/11.  MRI brain negative for stroke.  CT angio head and neck showed stenosis vertebral, internal carotid  and basilar arteries.  Will continue aspirin.  Strength improved from yesterday for his left leg. 5/12.  Started meds for constipation. 5/13.  Added lactulose for constipation.  Patient unable to straight leg raise with left leg today.  Developed some hematuria overnight.  Assessment and Plan: * Closed T9 spinal fracture (HCC) Chronic back pain/history of lumbar laminectomy and cervical fusion Neurosurgery took urgently to the OR 5/10 secondary to thoracic instability.  Patient had a open reduction internal fixation of T9 fracture and posterolateral arthrodesis T7-T12.  Continue PT and OT evaluations.  Patient will need rehab.  Pain control.  Uncontrolled type 2 diabetes mellitus with hyperglycemia, with long-term current use of insulin (HCC) Diabetic neuropathy Hemoglobin A1c 7.5 basal insulin with sliding scale coverage Continue gabapentin, duloxetine.  Facial droop Transient facial droop likely secondary to trauma.  MRI negative for stroke.  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  Continue aspirin.  Essential hypertension Continue Toprol.  Bladder neck contracture Foley catheter placed in operating room by urology.  Recommend keeping him catheter for 5 to 10 days  Transaminitis Improved  OSA on CPAP Continue CPAP  Obesity, Class III, BMI 40-49.9 (morbid obesity) (HCC) BMI 42.63  Leukocytosis Reactive.  Improved  Prostate cancer (HCC) On leuprolide every 6 months  Radial head fracture, closed Ruled out by orthopedic surgery.  Cleared  to work with physical therapy with his left arm.   Hematuria Will give gentle IV fluid.  Occurred after being moved back to the bed yesterday evening with [facility] lift.  Constipation Continue MiraLAX and added lactulose.  AKI (acute kidney  injury) (HCC) Creatinine 1.42 improved to 1.01 with IV fluids.  Hyponatremia Sodium 132        Subjective: Patient stated he has not had a bowel movement.  Still having back and chest discomfort.  Patient has not walked well.  Frustrated that he is unable to lift up his left leg.  Admitted after trauma with lawnmower falling on him and having urgent thoracic back surgery.  Physical Exam: Vitals:   11/14/22 0916 11/14/22 2238 11/14/22 2324 11/15/22 0733  BP: 137/61 130/74 130/70 119/66  Pulse: 78 87 85 74  Resp: 16  14 17   Temp: 98.1 F (36.7 C)  98.6 F (37 C) (!) 97.5 F (36.4 C)  TempSrc: Oral     SpO2: 98% 95% 96% 97%  Weight:      Height:       Physical Exam HENT:     Head: Normocephalic.     Mouth/Throat:     Pharynx: No oropharyngeal exudate.  Eyes:     General: Lids are normal.     Conjunctiva/sclera: Conjunctivae normal.  Cardiovascular:     Rate and Rhythm: Normal rate and regular rhythm.     Heart sounds: Normal heart sounds, S1 normal and S2 normal.  Pulmonary:     Breath sounds: No decreased breath sounds, wheezing, rhonchi or rales.  Abdominal:     Palpations: Abdomen is soft.     Tenderness: There is no abdominal tenderness.  Musculoskeletal:     Right lower leg: No swelling.     Left lower leg: No swelling.  Skin:    General: Skin is warm.     Findings: No rash.  Neurological:     Mental Status: He is alert and oriented to person, place, and time.     Comments: Able to flex up and down with left ankle.  Able to flex slightly with left knee.  Unable to straight leg raise with left leg today     Data Reviewed: Sodium 132, creatinine 1.04, hemoglobin 11.3  Family Communication: Spoke with wife at bedside  Disposition: Status is: Inpatient Remains inpatient appropriate because: Developed hematuria yesterday afternoon  Planned Discharge Destination: Rehab    Time spent: 27 minutes  Author: Alford Highland, MD 11/15/2022 3:21 PM  For on  call review [email or web address].

## 2022-11-15 NOTE — Assessment & Plan Note (Signed)
Resolved and now having diarrhea.  Hold anticonstipation medications.

## 2022-11-15 NOTE — TOC Progression Note (Signed)
Transition of Care [facility]) - Progression Note    Patient Details  Name: Alexander Wilcox MRN: [id number] Date of Birth: [birth year]  [facility]) CM/SW Contact  Marlowe Sax, RN Phone Number: 11/15/2022, 2:23 PM  Clinical Narrative:    Met with the patient and his wife in the room, they are excited that [facility] will offer a bed, Ins will need to be started once he is able to work with PT again   Expected Discharge Plan: Skilled Nursing Facility Barriers to Discharge: SNF Pending bed offer, Insurance Authorization  Expected Discharge Plan and Services       Living arrangements for the past 2 months: Single Family Home                                       Social Determinants of Health (SDOH) Interventions SDOH Screenings   Food Insecurity: No Food Insecurity (11/12/2022)  Housing: Low Risk  (11/12/2022)  Transportation Needs: No Transportation Needs (11/12/2022)  Utilities: Not At Risk (11/12/2022)  Tobacco Use: Medium Risk (11/15/2022)    Readmission Risk Interventions     No data to display

## 2022-11-15 NOTE — Plan of Care (Signed)
  Problem: Clinical Measurements: Goal: Ability to maintain clinical measurements within normal limits will improve Outcome: Progressing Goal: Will remain free from infection Outcome: Progressing   Problem: Activity: Goal: Risk for activity intolerance will decrease Outcome: Progressing   Problem: Nutrition: Goal: Adequate nutrition will be maintained Outcome: Progressing   Problem: Elimination: Goal: Will not experience complications related to urinary retention Outcome: Progressing   Problem: Pain Managment: Goal: General experience of comfort will improve Outcome: Progressing   Problem: Safety: Goal: Ability to remain free from injury will improve Outcome: Progressing   Problem: Skin Integrity: Goal: Risk for impaired skin integrity will decrease Outcome: Progressing   

## 2022-11-15 NOTE — TOC Progression Note (Signed)
Transition of Care [facility]) - Progression Note    Patient Details  Name: Alexander Wilcox MRN: [id number] Date of Birth: Feb 16, 1951  Transition of Care [facility]) CM/SW Contact  Marlowe Sax, RN Phone Number: 11/15/2022, 10:29 AM  Clinical Narrative:   Reached out to Tiffany at [facility] asking if they are going to be able to offer a bed, awaiting a responce    Expected Discharge Plan: Skilled Nursing Facility Barriers to Discharge: Continued Medical Work up  Expected Discharge Plan and Services       Living arrangements for the past 2 months: Single Family Home                                       Social Determinants of Health (SDOH) Interventions SDOH Screenings   Food Insecurity: No Food Insecurity (11/12/2022)  Housing: Low Risk  (11/12/2022)  Transportation Needs: No Transportation Needs (11/12/2022)  Utilities: Not At Risk (11/12/2022)  Tobacco Use: Medium Risk (11/12/2022)    Readmission Risk Interventions     No data to display

## 2022-11-15 NOTE — Consult Note (Signed)
ANTICOAGULATION CONSULT NOTE - Initial Consult  Pharmacy Consult for Enoxaparin Indication: VTE prophylaxis  Allergies  Allergen Reactions   Dilaudid [Hydromorphone Hcl] Itching    Patient Measurements: Height: 5\' 6"  (167.6 cm) Weight: 119.8 kg (264 lb 1.8 oz) IBW/kg (Calculated) : 63.8  Vital Signs: Temp: 97.5 F (36.4 C) (05/13 0733) BP: 119/66 (05/13 0733) Pulse Rate: 74 (05/13 0733)  Labs: Recent Labs    11/14/22 0514 11/15/22 0420  HGB 11.8* 11.3*  HCT 34.4* 33.1*  PLT 154 150  CREATININE 1.01  --     Estimated Creatinine Clearance: 83 mL/min (by C-G formula based on SCr of 1.01 mg/dL).  Medical History: Past Medical History:  Diagnosis Date   Arthritis    Diabetes mellitus without complication (HCC)    Hypertension    Prostate cancer (HCC)    Sleep apnea    Assessment: Patient admitted with closed T9 spinal fracture. PMH includes HTN, insulin-dependent type 2 diabetes with neuropathy, OSA on CPAP, HLD, prostate cancer on leuprolide. Pharmacy consulted to initiated enoxaparin for VTE prophylaxis 72hr after laminectomy and cervical fusion.   CrCl 100ml/min, Wt 119.8kg (BMI > 40), CBC stable, Aki resolved, MRI negative for stroke.    Plan:  Lovenox 0.5mg  Alexander Wilcox q 24hrs.  Alexander Wilcox PharmD, BCPS 11/15/2022 7:36 AM

## 2022-11-15 NOTE — Assessment & Plan Note (Signed)
Will give gentle IV fluid.  Occurred after being moved back to the bed yesterday evening with [facility] lift.

## 2022-11-16 ENCOUNTER — Inpatient Hospital Stay: Payer: Medicare HMO

## 2022-11-16 DIAGNOSIS — S22079S Unspecified fracture of T9-T10 vertebra, sequela: Secondary | ICD-10-CM

## 2022-11-16 LAB — GLUCOSE, CAPILLARY
Glucose-Capillary: 161 mg/dL — ABNORMAL HIGH (ref 70–99)
Glucose-Capillary: 165 mg/dL — ABNORMAL HIGH (ref 70–99)
Glucose-Capillary: 170 mg/dL — ABNORMAL HIGH (ref 70–99)
Glucose-Capillary: 184 mg/dL — ABNORMAL HIGH (ref 70–99)
Glucose-Capillary: 189 mg/dL — ABNORMAL HIGH (ref 70–99)
Glucose-Capillary: 199 mg/dL — ABNORMAL HIGH (ref 70–99)

## 2022-11-16 MED ORDER — OXYCODONE HCL 5 MG PO TABS
10.0000 mg | ORAL_TABLET | ORAL | Status: DC | PRN
  Administered 2022-11-16 – 2022-11-18 (×7): 10 mg via ORAL
  Filled 2022-11-16 (×7): qty 2

## 2022-11-16 MED ORDER — POLYETHYLENE GLYCOL 3350 17 G PO PACK: 17.0000 g | PACK | Freq: Every day | ORAL | Status: DC | PRN

## 2022-11-16 NOTE — TOC Progression Note (Signed)
Transition of Care [facility]) - Progression Note    Patient Details  Name: Alexander Wilcox MRN: [id number] Date of Birth: August 30, 1950  Transition of Care [facility]) CM/SW Contact  Marlowe Sax, RN Phone Number: 11/16/2022, 4:23 PM  Clinical Narrative:    Approved 5/15 - 5/17, review 5/17. Reference ID: [id number]. Plan Auth ID: [id number]    Expected Discharge Plan: Skilled Nursing Facility Barriers to Discharge: SNF Pending bed offer, Insurance Authorization  Expected Discharge Plan and Services       Living arrangements for the past 2 months: Single Family Home                                       Social Determinants of Health (SDOH) Interventions SDOH Screenings   Food Insecurity: No Food Insecurity (11/12/2022)  Housing: Low Risk  (11/12/2022)  Transportation Needs: No Transportation Needs (11/12/2022)  Utilities: Not At Risk (11/12/2022)  Tobacco Use: Medium Risk (11/15/2022)    Readmission Risk Interventions     No data to display

## 2022-11-16 NOTE — Progress Notes (Signed)
Occupational Therapy Treatment Patient Details Name: Alexander Wilcox MRN: [id number] DOB: [birth year] Today's Date: 11/16/2022   History of present illness Pt admitted to [facility] on 11/11/22 for c/o crush injury; riding lawn mower rolled backwards and landed on his chest for about 2-3 minutes. Pt endorses hitting his head on the ground, but no LOC, bil rib pain, L ankle pain, and R knee pain. Imaging significant for T9-10 fracture, T8-9 anterior longitudinal ligament tear,  and L radial head nondisplaced fracture. S/p surgical fixation with T7-12 posterior spinal instrumentation and fusion with ORIF on 5/10. Concern for stroke after surgery due to c/o dysarthria and facial droop; imaging negative and pt not a candidate for tPA due to recent major surgery. Significant PMH includes: arthritis, diabetes, HTN, prostate CA, sleep apnea, cLBP, T2DM with neuropathy, HLD, cervical myelopathy (s/p cervical fusion).   OT comments  Pt received in bed with PTA in room, co-treat for pt and therapist safety. Patient participates with good motivation throughout session. Log rolling technique with mod a x 1, transition to EOB max a x 2. TLSO donned sitting EOB, pt with good tolerance to sitting EOB. Several very large loose explosive BM's during tx session, requiring total assist throughout for posterior hygiene. Care provided, and linens changed as necessary (tech aware). Able to stand EOB 3x using RW with mod a x 2, cues for upright posture. Unable to perform sidesteps or offset weight from BLE due to heavy lean on RW. Pt returned to supine with HOB elevated, all needs within reach.     Recommendations for follow up therapy are one component of a multi-disciplinary discharge planning process, led by the attending physician.  Recommendations may be updated based on patient status, additional functional criteria and insurance authorization.    Assistance Recommended at Discharge Frequent or constant Supervision/Assistance   Patient can return home with the following  Two people to help with walking and/or transfers;Two people to help with bathing/dressing/bathroom;Assistance with cooking/housework;Direct supervision/assist for financial management;Direct supervision/assist for medications management;Assist for transportation;Help with stairs or ramp for entrance   Equipment Recommendations  Other (comment)    Recommendations for Other Services      Precautions / Restrictions Precautions Precautions: Back;Fall Required Braces or Orthoses: Other Brace Other Brace: TLSO Restrictions Weight Bearing Restrictions: No Other Position/Activity Restrictions: MD confirmed no R wrist fx present. Bruised/painful ribs and back; s/p back surgery       Mobility Bed Mobility Overal bed mobility: Needs Assistance Bed Mobility: Rolling, Supine to Sit, Sit to Supine Rolling: Mod assist   Supine to sit: Max assist, +2 for physical assistance Sit to supine: Max assist, +2 for physical assistance        Transfers Overall transfer level: Needs assistance Equipment used: Rolling walker (2 wheels) Transfers: Sit to/from Stand Sit to Stand: Mod assist, +2 safety/equipment           General transfer comment: difficulty getting fully upright     Balance Overall balance assessment: Needs assistance Sitting-balance support: Feet supported Sitting balance-Leahy Scale: Good   Postural control: Posterior lean Standing balance support: Bilateral upper extremity supported                               ADL either performed or assessed with clinical judgement   ADL Overall ADL's : Needs assistance/impaired  Toileting- Clothing Manipulation and Hygiene: Bed level;Cueing for back precautions;Total assistance;+2 for physical assistance       Functional mobility during ADLs: +2 for physical assistance;Cueing for sequencing;Maximal assistance;Moderate assistance  (Log roll technique mod a x1,  max a x 2 from supine with HOB elevated to sitting EOB, sits unsupported overall CGA, total A for donning TLSO) General ADL Comments: Pt with good efforts throughout, overall total assist for toliet hygiene.    Extremity/Trunk Assessment Upper Extremity Assessment Upper Extremity Assessment: Generalized weakness   Lower Extremity Assessment Lower Extremity Assessment: Generalized weakness   Cervical / Trunk Assessment Cervical / Trunk Assessment: Back Surgery    Vision Ability to See in Adequate Light: 0 Adequate            Cognition Arousal/Alertness: Awake/alert Behavior During Therapy: WFL for tasks assessed/performed Overall Cognitive Status: Within Functional Limits for tasks assessed                                 General Comments: Pt able to follow instructions, verbalizes understanding of TLSO                   Pertinent Vitals/ Pain       Pain Assessment Pain Assessment: Faces Faces Pain Scale: Hurts little more  Home Living                                          Prior Functioning/Environment              Frequency  Min 1X/week        Progress Toward Goals  OT Goals(current goals can now be found in the care plan section)  Progress towards OT goals: Progressing toward goals  Acute Rehab OT Goals Time For Goal Achievement: 11/27/22 Potential to Achieve Goals: Fair  Plan Discharge plan remains appropriate;Frequency remains appropriate    Co-evaluation    PT/OT/SLP Co-Evaluation/Treatment: Yes Reason for Co-Treatment: Complexity of the patient's impairments (multi-system involvement) PT goals addressed during session: Mobility/safety with mobility;Balance OT goals addressed during session: ADL's and self-care      AM-PAC OT "6 Clicks" Daily Activity     Outcome Measure   Help from another person eating meals?: None Help from another person taking care of personal  grooming?: None Help from another person toileting, which includes using toliet, bedpan, or urinal?: Total Help from another person bathing (including washing, rinsing, drying)?: Total Help from another person to put on and taking off regular upper body clothing?: A Lot Help from another person to put on and taking off regular lower body clothing?: Total 6 Click Score: 13    End of Session    OT Visit Diagnosis: Muscle weakness (generalized) (M62.81);Pain   Activity Tolerance Patient limited by pain   Patient Left in bed;with call bell/phone within reach;with bed alarm set   Nurse Communication          Time: 8119-1478 OT Time Calculation (min): 34 min  Charges: OT General Charges $OT Visit: 1 Visit OT Treatments $Self Care/Home Management : 8-22 mins  Raychelle Hudman L. Tyaire Odem, OTR/L  11/16/22, 4:10 PM

## 2022-11-16 NOTE — TOC Progression Note (Signed)
Transition of Care [facility]) - Progression Note    Patient Details  Name: Alexander Wilcox MRN: [id number] Date of Birth: Oct 06, 1950  Transition of Care [facility]) CM/SW Contact  Marlowe Sax, RN Phone Number: 11/16/2022, 12:13 PM  Clinical Narrative:    TOC continues to follow the patient, He plans to DC to [facility], Once able to work with PT will get ins auth started    Expected Discharge Plan: Skilled Nursing Facility Barriers to Discharge: SNF Pending bed offer, Insurance Authorization  Expected Discharge Plan and Services       Living arrangements for the past 2 months: Single Family Home                                       Social Determinants of Health (SDOH) Interventions SDOH Screenings   Food Insecurity: No Food Insecurity (11/12/2022)  Housing: Low Risk  (11/12/2022)  Transportation Needs: No Transportation Needs (11/12/2022)  Utilities: Not At Risk (11/12/2022)  Tobacco Use: Medium Risk (11/15/2022)    Readmission Risk Interventions     No data to display

## 2022-11-16 NOTE — Discharge Instructions (Signed)
  Your surgeon has performed an operation on your spine. Many times, patients feel better immediately after surgery and can "overdo it." Even if you feel well, it is important that you follow these activity guidelines. If you do not let your back heal properly from the surgery, you can increase the chance of hardware complications and/or return of your symptoms. The following are instructions to help in your recovery once you have been discharged from the hospital.  Do not use NSAIDs for 3 months after surgery.   Activity    No bending, lifting, or twisting ("BLT"). Avoid lifting objects heavier than 10 pounds (gallon milk jug).  Where possible, avoid household activities that involve lifting, bending, pushing, or pulling such as laundry, vacuuming, grocery shopping, and childcare. Try to arrange for help from friends and family for these activities while your back heals.  Increase physical activity slowly as tolerated.  Taking short walks is encouraged, but avoid strenuous exercise. Do not jog, run, bicycle, lift weights, or participate in any other exercises unless specifically allowed by your doctor. Avoid prolonged sitting, including car rides.  Talk to your doctor before resuming sexual activity.  You should not drive until cleared by your doctor.  Until released by your doctor, you should not return to work or school.  You should rest at home and let your body heal.   You may shower three days after your surgery.  After showering, lightly dab your incision dry. Do not take a tub bath or go swimming for 3 weeks, or until approved by your doctor at your follow-up appointment.  If you smoke, we strongly recommend that you quit.  Smoking has been proven to interfere with normal healing in your back and will dramatically reduce the success rate of your surgery. Please contact [facility] (800-QUIT-NOW) and use the resources at www.[facility].com for assistance in stopping smoking.  Surgical  Incision   If you have a dressing on your incision, you may remove it three days after your surgery. Keep your incision area clean and dry.  Your incision was closed with Dermabond glue. The glue should begin to peel away within about a week.  Diet            You may return to your usual diet. Be sure to stay hydrated.  When to Contact us  Although your surgery and recovery will likely be uneventful, you may have some residual numbness, aches, and pains in your back and/or legs. This is normal and should improve in the next few weeks.  However, should you experience any of the following, contact us immediately: New numbness or weakness Pain that is progressively getting worse, and is not relieved by your pain medications or rest Bleeding, redness, swelling, pain, or drainage from surgical incision Chills or flu-like symptoms Fever greater than 101.0 F (38.3 C) Problems with bowel or bladder functions Difficulty breathing or shortness of breath Warmth, tenderness, or swelling in your calf  Contact Information During office hours (Monday-Friday 9 am to 5 pm), please call your physician at [phone] and ask for Sharlot Gowda After hours and weekends, please call [phone] and speak with the neurosurgeon on call For a life-threatening emergency, call 911

## 2022-11-16 NOTE — Progress Notes (Signed)
Attending Progress Note  History: Alexander Wilcox is here for thoracic spinal cord fusion.  He unfortunately suffered a thoracic spinal fracture.  He was taken to the OR yesterday for reduction and fixation.  POD 0: Developed left sided weakness without any sensation changes.  Neurology was consulted for possibility of stroke given some left-sided facial weakness as well.  He underwent a thoracic MRI as well as a brain MRI with no acute findings.  POD1: Continues to have back pain as well as rib pain throughout.  He has had improved lower extremity function over the night.  Continues to have some mild facial asymmetry, however difficulty concerning whether or not this is due to some swelling in his lips.  POD 3: Continues to have back pain as expected.  Rib pain is stable.  He feels that his left lower extremity continues to be stable in its overall function.  He did not have any more episodes of complete loss of function in his left lower extremity yesterday.  POD4: Pt attempting to use the bedpan  POD5: Continues to have abdominal distention and pain as a result. Currently on the bedpan. Reports 3-4 small Bms this morning  Physical Exam: Vitals:   11/15/22 2255 11/15/22 2344  BP: (!) 148/78 135/74  Pulse: 89 91  Resp: 17 20  Temp: 99.5 F (37.5 C) 98 F (36.7 C)  SpO2: 97% 95%    AA Ox3 CNI, continued left-sided facial asymmetry.   Strength: 4/5 DF, PF, 3/5 EHL, 2/5 HF and KE on the left. 4+/5 throughout on the right  Incision covered with incisional wound vac  Data:  Recent Labs  Lab 11/11/22 2152 11/12/22 0424 11/14/22 0514  NA 134* 134* 132*  K 4.2 3.8 3.7  CL 96* 100 100  CO2 22 23 25   BUN 30* 35* 17  CREATININE 1.41* 1.42* 1.01  GLUCOSE 273* 169* 188*  CALCIUM 10.3 9.3 8.7*    Recent Labs  Lab 11/12/22 0424  AST 39  ALT 44  ALKPHOS 73      Recent Labs  Lab 11/12/22 0424 11/14/22 0514 11/15/22 0420  WBC 7.1 8.1 7.6  HGB 13.9 11.8* 11.3*  HCT  40.4 34.4* 33.1*  PLT 161 154 150    Recent Labs  Lab 11/12/22 0135  INR 1.1          Other tests/results:  Narrative & Impression  CLINICAL DATA:  Facial droop   EXAM: MRI HEAD WITHOUT CONTRAST   TECHNIQUE: Multiplanar, multiecho pulse sequences of the brain and surrounding structures were obtained without intravenous contrast.   COMPARISON:  None Available.   FINDINGS: Brain: No acute infarct, mass effect or extra-axial collection. No acute or chronic hemorrhage. Normal white matter signal, parenchymal volume and CSF spaces. The midline structures are normal.   Vascular: Major flow voids are preserved.   Skull and upper cervical spine: Normal calvarium and skull base. Visualized upper cervical spine and soft tissues are normal.   Sinuses/Orbits:No paranasal sinus fluid levels or advanced mucosal thickening. No mastoid or middle ear effusion. Normal orbits.   IMPRESSION: Normal brain MRI.     Electronically Signed   By: Deatra Robinson M.D.   On: 11/12/2022 22:13   arrative & Impression  CLINICAL DATA:  Trauma   EXAM: MRI THORACIC SPINE WITHOUT CONTRAST   TECHNIQUE: Multiplanar, multisequence MR imaging of the thoracic spine was performed. No intravenous contrast was administered.   COMPARISON:  11/11/2022 thoracic spine CT and 11/12/2022 preoperative thoracic  spine MRI   FINDINGS: Alignment:  Physiologic.   Vertebrae: There is now posterior instrumented fusion extending from T7-T12, traversing the site of T9-10 fracture. No new osseous abnormality.   Cord:  Normal signal and morphology.   Paraspinal and other soft tissues: Negative.   Disc levels:   Unchanged severe narrowing of the thecal sac at T6-7 and T7-8   IMPRESSION: Posterior instrumented fusion extending from T7-T12, traversing the site of T9-10 fracture. Alignment is normal.     Electronically Signed   By: Deatra Robinson M.D.   On: 11/12/2022 22:11    Assessment/Plan:  Alexander Wilcox is a 72 year old man with a history of DISH arthritis.  He suffered a fall and developed a 3 column thoracic spinal fracture.  He was taken to the OR for open decompression and fusion with reduction of his fracture.  - mobilize - pain control - DVT prophylaxis - PTOT, bracing while out of bed. - Avoid hypotension, currently auto mapping for his spinal cord health. - Will report wound vac this morning and replace with clean dressing.   Manning Charity PA-C Department of Neurosurgery

## 2022-11-16 NOTE — Plan of Care (Signed)
  Problem: Education: Goal: Knowledge of General Education information will improve Description: Including pain rating scale, medication(s)/side effects and non-pharmacologic comfort measures Outcome: Progressing   Problem: Health Behavior/Discharge Planning: Goal: Ability to manage health-related needs will improve Outcome: Progressing   Problem: Clinical Measurements: Goal: Ability to maintain clinical measurements within normal limits will improve Outcome: Progressing   Problem: Activity: Goal: Risk for activity intolerance will decrease Outcome: Progressing   Problem: Nutrition: Goal: Adequate nutrition will be maintained Outcome: Progressing   Problem: Elimination: Goal: Will not experience complications related to bowel motility Outcome: Progressing   Problem: Pain Managment: Goal: General experience of comfort will improve Outcome: Progressing   

## 2022-11-16 NOTE — Progress Notes (Signed)
Called [employer] support because device not working, will not turn on. Advised to charge device. Once hooked up to charger device turned on. Device on and charging at this time.

## 2022-11-16 NOTE — Progress Notes (Signed)
Progress Note   Patient: Alexander Wilcox ZOX:[id number] DOB: 07/31/1950 DOA: 11/11/2022     4 DOS: the patient was seen and examined on 11/16/2022   Brief hospital course: 72 y.o. male with medical history significant for HTN, insulin-dependent type 2 diabetes with neuropathy, OSA on CPAP, HLD, prostate cancer on leuprolide, failed back surgical syndrome, and cervical myelopathy s/p cervical fusion who presented to the ED for evaluation of acute back pain related to trauma when his riding lawn more rolled backwards off the trailer, knocking him down, landing on his torso and left hip and pinning him to the ground.  He hit his head but did not lose consciousness.  His wife had to power on to mower and drive it off of him.  He was able to get up and cautiously ambulate but he has been complaining of abdominal, chest wall, left elbow ankle and right knee pain.  He was previously in his usual state of health. ED course and data review: Vitals within normal limits.  Labs notable for hyperglycemia of 273 with anion gap 16 and bicarb 22., creatinine 1.41.  Mild transaminitis with AST 50 and ALT 57.  WBC 11,900 with otherwise normal CBC. Trauma imaging showed a fracture of T9 on CT imaging and a left radial head nondisplaced fracture.  No other injuries noted on pan scan The ED provider spoke with neurosurgeon, Dr. Marcell Barlow who will take patient to the OR on 5/10.  5/10.  Dr. Myer Haff did open reduction internal fixation of T9 fracture and posterolateral arthrodesis T7-T12.  Dr. Richardo Hanks urology placed a Foley catheter for bladder neck contracture requiring dilatation.  When I came from my evaluation patient's wife noticed a left facial droop and intermittent slurred speech.  Case discussed with neurology and a CT angio head and neck ordered.  Patient not a candidate for tPA since had major surgery today. 5/11.  MRI brain negative for stroke.  CT angio head and neck showed stenosis vertebral, internal carotid  and basilar arteries.  Will continue aspirin.  Strength improved from yesterday for his left leg. 5/12.  Started meds for constipation. 5/13.  Added lactulose for constipation.  Patient unable to straight leg raise with left leg today.  Developed some hematuria overnight. 5/14.  Now having diarrhea.  Anticonstipation medications held.  Abdominal x-ray showing gaseous distention.  No vomiting so I doubt ileus.  Assessment and Plan: * Closed T9 spinal fracture (HCC) Chronic back pain/history of lumbar laminectomy and cervical fusion Neurosurgery took urgently to the OR 5/10 secondary to thoracic instability.  Patient had a open reduction internal fixation of T9 fracture and posterolateral arthrodesis T7-T12.  Continue PT and OT evaluations.  Patient will need rehab.  Pain control.  Uncontrolled type 2 diabetes mellitus with hyperglycemia, with long-term current use of insulin (HCC) Diabetic neuropathy Hemoglobin A1c 7.5 basal insulin with sliding scale coverage Continue gabapentin, duloxetine.  Facial droop Transient facial droop likely secondary to trauma.  MRI negative for stroke.  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  Continue aspirin.  Essential hypertension Continue Toprol.  Bladder neck contracture Foley catheter placed in operating room by urology.  Spoke with urology today and recommended follow-up in the clinic on 5/17 for Foley catheter removal.  Transaminitis Improved  OSA on CPAP Continue CPAP  Obesity, Class III, BMI 40-49.9 (morbid obesity) (  HCC) BMI 42.63  Leukocytosis Reactive.  Improved  Prostate cancer (HCC) On leuprolide every 6 months  Radial head fracture, closed Ruled out by orthopedic surgery.  Cleared to work with physical therapy with his left  arm.   Hematuria Occurred after being moved back to the bed with [facility] lift.  Constipation Resolved and now having diarrhea.  Hold anticonstipation medications.  AKI (acute kidney injury) (HCC) Creatinine 1.42 improved to 1.01 with IV fluids.  Hyponatremia Sodium 132        Subjective: Patient now having diarrhea with laxatives given yesterday.  Feels very weak.  Nursing staff to flush his Foley secondary to some clots.  Physical Exam: Vitals:   11/15/22 2004 11/15/22 2255 11/15/22 2344 11/16/22 0859  BP: 129/72 (!) 148/78 135/74 132/73  Pulse: 88 89 91 79  Resp: 20 17 20 18   Temp: 98.5 F (36.9 C) 99.5 F (37.5 C) 98 F (36.7 C) 98.6 F (37 C)  TempSrc:  Oral  Oral  SpO2: 99% 97% 95% 98%  Weight:      Height:       Physical Exam HENT:     Head: Normocephalic.     Mouth/Throat:     Pharynx: No oropharyngeal exudate.  Eyes:     General: Lids are normal.     Conjunctiva/sclera: Conjunctivae normal.  Cardiovascular:     Rate and Rhythm: Normal rate and regular rhythm.     Heart sounds: Normal heart sounds, S1 normal and S2 normal.  Pulmonary:     Breath sounds: No decreased breath sounds, wheezing, rhonchi or rales.  Abdominal:     Palpations: Abdomen is soft.     Tenderness: There is no abdominal tenderness.  Musculoskeletal:     Right lower leg: No swelling.     Left lower leg: No swelling.  Skin:    General: Skin is warm.     Findings: No rash.  Neurological:     Mental Status: He is alert and oriented to person, place, and time.     Comments: Able to flex up and down with left ankle.  Able to flex slightly with left knee.  Unable to straight leg raise with left leg today     Data Reviewed: Gaseous distention of the colon with gas in the rectum.  No evidence of obstruction.  Potential ileus.  Family Communication: Spoke with wife at the bedside  Disposition: Status is: Inpatient Remains inpatient appropriate because: Now having diarrhea with  anticonstipation meds given.  Still having a lot of pain and requiring IV pain meds.  Planned Discharge Destination: Skilled nursing facility    Time spent: 28 minutes Case discussed with nursing staff.  Author: Alford Highland, MD 11/16/2022 3:46 PM  For on call review [email or web address].

## 2022-11-16 NOTE — Consult Note (Signed)
[facility]) Accountable Care Organization (ACO) [facility] Liaison Note  11/16/2022  Alexander Wilcox 1950/10/29 [id number]  Location: [facility] RN Hospital Liaison screened the patient remotely at [facility].  Insurance: Alexander Wilcox is a 72 y.o. male who is a Primary Care Patient of Alexander Ivan, MD [facility]). The patient was screened for readmission hospitalization with noted medium risk score for unplanned readmission risk with  1 IP in 6 months.  The patient was assessed for potential [facility]) Care Management service needs for post hospital transition for care coordination. Review of patient's electronic medical record reveals patient was admitted due to a Blunt Trauma. Currently pending rehab for SNF level of care.  Plan: [facility] Liaison will continue to follow progress and disposition to asess for post hospital community care coordination/management needs.  Referral request for community care coordination: pending disposition.   [facility] Care Management/Population Health does not replace or interfere with any arrangements made by the Inpatient Transition of Care team.   For questions contact:   Alexander Cousin, RN, BSN [facility] Liaison Enterprise   [facility]  Population Health Office Hours MTWF 8:00 am to 6 pm off on Thursday [phone] mobile [phone] [Office toll free line][facility] Office Hours are M-F 8:30 - 5 pm 24 hour nurse advise line [phone] Conceirge  [email or web address]

## 2022-11-16 NOTE — Progress Notes (Signed)
Physical Therapy Treatment Patient Details Name: Alexander Wilcox MRN: [id number] DOB: 1950-09-02 Today's Date: 11/16/2022   History of Present Illness Pt admitted to [facility] on 11/11/22 for c/o crush injury; riding lawn mower rolled backwards and landed on his chest for about 2-3 minutes. Pt endorses hitting his head on the ground, but no LOC, bil rib pain, L ankle pain, and R knee pain. Imaging significant for T9-10 fracture, T8-9 anterior longitudinal ligament tear,  and L radial head nondisplaced fracture. S/p surgical fixation with T7-12 posterior spinal instrumentation and fusion with ORIF on 5/10. Concern for stroke after surgery due to c/o dysarthria and facial droop; imaging negative and pt not a candidate for tPA due to recent major surgery. Significant PMH includes: arthritis, diabetes, HTN, prostate CA, sleep apnea, cLBP, T2DM with neuropathy, HLD, cervical myelopathy (s/p cervical fusion).    PT Comments    Pt ready for session.  Participated in exercises as described below.  OT arrived for mobility skills.  Rolling with mod a x 1.  Transition to EOB with max a x 2.  Steady in sitting with max a to don back brace. He is able to stand x 3 at EOB with RW and mod a x 2.  He has difficulty standing fully upright but does put in excellent effort.  He does attempt sidesteps but is unable to move or shuffle his feet.  Transitions back to supine with max a x 2.    Pt does have a several very large loose explosive BM's during mobility.  Care is provided and linen changed.  Tech aware.   Recommendations for follow up therapy are one component of a multi-disciplinary discharge planning process, led by the attending physician.  Recommendations may be updated based on patient status, additional functional criteria and insurance authorization.  Follow Up Recommendations       Assistance Recommended at Discharge Frequent or constant Supervision/Assistance  Patient can return home with the following Two  people to help with walking and/or transfers;Two people to help with bathing/dressing/bathroom;Assistance with cooking/housework;Direct supervision/assist for medications management;Direct supervision/assist for financial management;Assist for transportation;Help with stairs or ramp for entrance   Equipment Recommendations       Recommendations for Other Services       Precautions / Restrictions Precautions Precautions: Back;Fall Required Braces or Orthoses: Other Brace Other Brace: TLSO Restrictions Weight Bearing Restrictions: No Other Position/Activity Restrictions: MD confirmed no R wrist fx present. Bruised/painful ribs and back; s/p back surgery     Mobility  Bed Mobility Overal bed mobility: Needs Assistance Bed Mobility: Rolling, Supine to Sit, Sit to Supine Rolling: Mod assist   Supine to sit: Max assist, +2 for physical assistance Sit to supine: Max assist, +2 for physical assistance     Patient Response: Cooperative  Transfers Overall transfer level: Needs assistance Equipment used: Rolling walker (2 wheels) Transfers: Sit to/from Stand Sit to Stand: Mod assist, +2 safety/equipment           General transfer comment: difficulty getting fully upright    Ambulation/Gait               General Gait Details: uanble to step   Stairs             Wheelchair Mobility    Modified Rankin (Stroke Patients Only)       Balance Overall balance assessment: Needs assistance Sitting-balance support: Feet supported Sitting balance-Leahy Scale: Good     Standing balance support: Bilateral upper extremity supported Standing balance-Leahy  Scale: Poor Standing balance comment: +2 with forward lean on walker                            Cognition Arousal/Alertness: Awake/alert Behavior During Therapy: WFL for tasks assessed/performed Overall Cognitive Status: Within Functional Limits for tasks assessed                                  General Comments: Pt able to provide all info and follow commands. Does keep eyes closed most of the time 2/2 pain, but is fully alert.        Exercises Other Exercises Other Exercises: BLE AAROM x 10 prior to mobility and OT arrival    General Comments        Pertinent Vitals/Pain Pain Assessment Pain Assessment: Faces Faces Pain Scale: Hurts even more Pain Location: back and ribs while laying in the bed with HOB elevated Pain Descriptors / Indicators: Aching, Grimacing Pain Intervention(s): Limited activity within patient's tolerance, Monitored during session, Repositioned    Home Living                          Prior Function            PT Goals (current goals can now be found in the care plan section) Progress towards PT goals: Progressing toward goals    Frequency    Min 2X/week      PT Plan Current plan remains appropriate    Co-evaluation PT/OT/SLP Co-Evaluation/Treatment: Yes Reason for Co-Treatment: Complexity of the patient's impairments (multi-system involvement) PT goals addressed during session: Mobility/safety with mobility;Balance OT goals addressed during session: ADL's and self-care      AM-PAC PT "6 Clicks" Mobility   Outcome Measure  Help needed turning from your back to your side while in a flat bed without using bedrails?: A Lot Help needed moving from lying on your back to sitting on the side of a flat bed without using bedrails?: Total Help needed moving to and from a bed to a chair (including a wheelchair)?: Total Help needed standing up from a chair using your arms (e.g., wheelchair or bedside chair)?: A Lot Help needed to walk in hospital room?: Total Help needed climbing 3-5 steps with a railing? : Total 6 Click Score: 8    End of Session Equipment Utilized During Treatment: Gait belt;Back brace Activity Tolerance: Patient limited by fatigue;Patient limited by pain Patient left: in bed;with call  bell/phone within reach;with bed alarm set Nurse Communication: Mobility status PT Visit Diagnosis: Unsteadiness on feet (R26.81);Muscle weakness (generalized) (M62.81);Difficulty in walking, not elsewhere classified (R26.2);Pain     Time: 1321-1403 PT Time Calculation (min) (ACUTE ONLY): 42 min  Charges:  $Therapeutic Exercise: 8-22 mins $Therapeutic Activity: 8-22 mins                   Danielle Dess, PTA 11/16/22, 2:32 PM

## 2022-11-17 DIAGNOSIS — Z794 Long term (current) use of insulin: Secondary | ICD-10-CM | POA: Diagnosis not present

## 2022-11-17 DIAGNOSIS — M532X4 Spinal instabilities, thoracic region: Secondary | ICD-10-CM | POA: Diagnosis not present

## 2022-11-17 DIAGNOSIS — E1165 Type 2 diabetes mellitus with hyperglycemia: Secondary | ICD-10-CM | POA: Diagnosis not present

## 2022-11-17 LAB — CBC
HCT: 33.1 % — ABNORMAL LOW (ref 39.0–52.0)
Hemoglobin: 11.7 g/dL — ABNORMAL LOW (ref 13.0–17.0)
MCH: 30.7 pg (ref 26.0–34.0)
MCHC: 35.3 g/dL (ref 30.0–36.0)
MCV: 86.9 fL (ref 80.0–100.0)
Platelets: 202 10*3/uL (ref 150–400)
RBC: 3.81 MIL/uL — ABNORMAL LOW (ref 4.22–5.81)
RDW: 12.8 % (ref 11.5–15.5)
WBC: 7.1 10*3/uL (ref 4.0–10.5)
nRBC: 0 % (ref 0.0–0.2)

## 2022-11-17 LAB — BASIC METABOLIC PANEL
Anion gap: 9 (ref 5–15)
BUN: 22 mg/dL (ref 8–23)
CO2: 22 mmol/L (ref 22–32)
Calcium: 8.6 mg/dL — ABNORMAL LOW (ref 8.9–10.3)
Chloride: 100 mmol/L (ref 98–111)
Creatinine, Ser: 1 mg/dL (ref 0.61–1.24)
GFR, Estimated: >60 mL/min (ref >60)
Glucose, Bld: 184 mg/dL — ABNORMAL HIGH (ref 70–99)
Potassium: 3.6 mmol/L (ref 3.5–5.1)
Sodium: 131 mmol/L — ABNORMAL LOW (ref 135–145)

## 2022-11-17 LAB — GLUCOSE, CAPILLARY
Glucose-Capillary: 175 mg/dL — ABNORMAL HIGH (ref 70–99)
Glucose-Capillary: 181 mg/dL — ABNORMAL HIGH (ref 70–99)
Glucose-Capillary: 198 mg/dL — ABNORMAL HIGH (ref 70–99)
Glucose-Capillary: 195 mg/dL — ABNORMAL HIGH (ref 70–99)
Glucose-Capillary: 159 mg/dL — ABNORMAL HIGH (ref 70–99)

## 2022-11-17 LAB — MAGNESIUM: Magnesium: 2 mg/dL (ref 1.7–2.4)

## 2022-11-17 LAB — PHOSPHORUS: Phosphorus: 3.2 mg/dL (ref 2.5–4.6)

## 2022-11-17 MED ORDER — CALCIUM CARBONATE ANTACID 500 MG PO CHEW
1.0000 | CHEWABLE_TABLET | Freq: Three times a day (TID) | ORAL | Status: DC
  Administered 2022-11-18: 200 mg via ORAL
  Filled 2022-11-17: qty 1

## 2022-11-17 MED ORDER — POLYETHYLENE GLYCOL 3350 17 G PO PACK
17.0000 g | PACK | Freq: Every day | ORAL | Status: DC
  Administered 2022-11-17: 17 g via ORAL
  Filled 2022-11-17 (×2): qty 1

## 2022-11-17 NOTE — Progress Notes (Signed)
PROGRESS NOTE    NAIROBI PIPES  ZOX:[id number] DOB: Aug 01, 1950 DOA: 11/11/2022 PCP: Marisue Ivan, MD    Brief Narrative:  72 y.o. male with medical history significant for HTN, insulin-dependent type 2 diabetes with neuropathy, OSA on CPAP, HLD, prostate cancer on leuprolide, failed back surgical syndrome, and cervical myelopathy s/p cervical fusion who presented to the ED for evaluation of acute back pain related to trauma when his riding lawn mower rolled backwards off the trailer, knocking him down, landing on his torso and left hip and pinning him to the ground.  He hit his head but did not lose consciousness.  His wife had to power on to mower and drive it off of him.  He was able to get up and cautiously ambulate but he has been complaining of abdominal, chest wall, left elbow ankle and right knee pain. Trauma imaging showed a fracture of T9 on CT imaging and a left radial head nondisplaced fracture.  No other injuries noted on pan scan  5/10.  Dr. Myer Haff did open reduction internal fixation of T9 fracture and posterolateral arthrodesis T7-T12.  Dr. Richardo Hanks urology placed a Foley catheter for bladder neck contracture requiring dilatation.  patient's wife noticed a left facial droop and intermittent slurred speech.  Case discussed with neurology and a CT angio head and neck ordered.  Patient not a candidate for tPA since had major surgery today. 5/11.  MRI brain negative for stroke.  CT angio head and neck showed stenosis vertebral, internal carotid and basilar arteries.  Will continue aspirin.  Strength improved from yesterday for his left leg. 5/12.  Started meds for constipation. 5/13.  Added lactulose for constipation.  Patient unable to straight leg raise with left leg . Developed some hematuria overnight. Remains very debilitated, now with ileus.   Assessment & Plan:   Closed traumatic T9 vertebral body fracture with history of extensive vertebral disease, lumbar  laminectomy and cervical fusion and chronic back pain: Suffered trauma.  Emergent surgical stabilization secondary to thoracic instability.  Remains in poor mobility, poor participation to physical therapy. Continue adequate pain medications, IV oral and IV opiates.  Continue to work with physical therapy.  Incentive spirometry.  Colonic ileus: Patient has significant abdominal distention.  No evidence of intestinal obstruction.  Passing flatus.  KUB with significantly dilated colon and air into the rectum. Continue to attempt regular diet. Resume MiraLAX daily to avoid constipation. Focus on mobility. Potassium, magnesium and phosphorus is adequate.  Continue IV fluids until good oral intake.  Uncontrolled type 2 diabetes with hyperglycemia, long-term use of insulin: Recent known A1c 7.5.  On basal and mealtime insulin.  On gabapentin and duloxetine for peripheral neuropathy.  Facial droop: Likely secondary to medications or stress.  MRI negative for acute stroke.  CT angiogram with cervical arterial disease.  Continue aspirin.  Essential hypertension: Patient seen, blood pressure controlled on Toprol.  Bladder neck contracture: Foley catheter placed in the operating room by urology.  Discharging with Foley catheter.  Follow-up with urology for voiding trials.  Sleep apnea: Using CPAP.  Morbid obesity, BMI more than 40.  He will definitely benefit with weight loss and exercise.  Prostate cancer: On leuprolide every 6 months.  Right radial head fracture: Ruled out.  Weightbearing as tolerated.     DVT prophylaxis: SCDs Start: 11/12/22 0259   Code Status: Full code Family Communication: Wife at the bedside Disposition Plan: Status is: Inpatient Remains inpatient appropriate because: Significant ileus, needs rehab  Consultants:  Urology Neurosurgery Orthopedics  Procedures:  T9 vertebral body stabilization and instrumentation  Antimicrobials:   None   Subjective: Patient seen and examined.  Wife at the bedside.  Patient is quite upset about not able to get out of the bed to the chair.  He is hoping for 1 today.  Still has significant pain and he is taking oxycodone.  He has no bowel movement since last 24 hours after having episodes of diarrhea.  Passing flatus.  Belly is distended.  Objective: Vitals:   11/16/22 1754 11/16/22 2159 11/17/22 0003 11/17/22 0749  BP: (!) 141/75 134/79 136/77 129/63  Pulse: 83 89 86 74  Resp: 18 17 20 18   Temp: 97.7 F (36.5 C) 97.8 F (36.6 C) 98 F (36.7 C) 98.3 F (36.8 C)  TempSrc: Oral   Oral  SpO2: 98% 97% 96% 96%  Weight:      Height:        Intake/Output Summary (Last 24 hours) at 11/17/2022 1315 Last data filed at 11/17/2022 0900 Gross per 24 hour  Intake 120 ml  Output 750 ml  Net -630 ml   Filed Weights   11/11/22 2117  Weight: 119.8 kg    Examination:  General: Sick looking gentleman, anxious with flat affect.  On room air.  In mild distress with back pain and circumstances. Cardiovascular: S1-S2 normal.  Regular rate rhythm. Respiratory: Bilateral clear.  No added sounds. Gastrointestinal: Soft.  Diffusely distended.  Bowel sounds are hyperactive.  Mild diffuse tenderness on deep palpation. Ext: No edema.      Data Reviewed: I have personally reviewed following labs and imaging studies  CBC: Recent Labs  Lab 11/11/22 2152 11/12/22 0424 11/14/22 0514 11/15/22 0420 11/17/22 0449  WBC 11.9* 7.1 8.1 7.6 7.1  NEUTROABS 8.8*  --   --   --   --   HGB 15.9 13.9 11.8* 11.3* 11.7*  HCT 46.5 40.4 34.4* 33.1* 33.1*  MCV 87.6 87.3 89.1 88.0 86.9  PLT 202 161 154 150 202   Basic Metabolic Panel: Recent Labs  Lab 11/11/22 2152 11/12/22 0424 11/14/22 0514 11/17/22 0449 11/17/22 0929  NA 134* 134* 132* 131*  --   K 4.2 3.8 3.7 3.6  --   CL 96* 100 100 100  --   CO2 22 23 25 22   --   GLUCOSE 273* 169* 188* 184*  --   BUN 30* 35* 17 22  --   CREATININE  1.41* 1.42* 1.01 1.00  --   CALCIUM 10.3 9.3 8.7* 8.6*  --   MG  --   --   --   --  2.0  PHOS  --   --   --   --  3.2   GFR: Estimated Creatinine Clearance: 83.8 mL/min (by C-G formula based on SCr of 1 mg/dL). Liver Function Tests: Recent Labs  Lab 11/11/22 2152 11/12/22 0424  AST 50* 39  ALT 57* 44  ALKPHOS 87 73  BILITOT 1.1 0.9  PROT 8.6* 7.1  ALBUMIN 4.9 4.2   No results for input(s): "LIPASE", "AMYLASE" in the last 168 hours. No results for input(s): "AMMONIA" in the last 168 hours. Coagulation Profile: Recent Labs  Lab 11/12/22 0135  INR 1.1   Cardiac Enzymes: No results for input(s): "CKTOTAL", "CKMB", "CKMBINDEX", "TROPONINI" in the last 168 hours. BNP (last 3 results) No results for input(s): "PROBNP" in the last 8760 hours. HbA1C: No results for input(s): "HGBA1C" in the last 72 hours. CBG: Recent  Labs  Lab 11/16/22 2014 11/16/22 2349 11/17/22 0406 11/17/22 0742 11/17/22 1213  GLUCAP 165* 161* 175* 181* 198*   Lipid Profile: No results for input(s): "CHOL", "HDL", "LDLCALC", "TRIG", "CHOLHDL", "LDLDIRECT" in the last 72 hours. Thyroid Function Tests: No results for input(s): "TSH", "T4TOTAL", "FREET4", "T3FREE", "THYROIDAB" in the last 72 hours. Anemia Panel: No results for input(s): "VITAMINB12", "FOLATE", "FERRITIN", "TIBC", "IRON", "RETICCTPCT" in the last 72 hours. Sepsis Labs: No results for input(s): "PROCALCITON", "LATICACIDVEN" in the last 168 hours.  No results found for this or any previous visit (from the past 240 hour(s)).       Radiology Studies: DG Abd 2 Views  Result Date: 11/16/2022 CLINICAL DATA:  Abdominal distension. Recent spine surgery (11/12/2022) EXAM: ABDOMEN - 2 VIEW COMPARISON:  Lumbar CT 11/11/2022 FINDINGS: There is gaseous distention of the colon. Gas in the rectum. No small bowel dilatation identified. No pathologic calcification. Posterior thoracolumbar fusion IMPRESSION: Gaseous distention of the colon with gas in  the rectum. No evidence of high-grade obstruction. Potential colonic ileus. Electronically Signed   By: Genevive Bi M.D.   On: 11/16/2022 15:32        Scheduled Meds:  aspirin EC  81 mg Oral Daily   atorvastatin  40 mg Oral Daily   Chlorhexidine Gluconate Cloth  6 each Topical Q0600   cyanocobalamin  1,000 mcg Oral Daily   DULoxetine  30 mg Oral Daily   enoxaparin (LOVENOX) injection  0.5 mg/kg Subcutaneous Q24H   fenofibrate  160 mg Oral Daily   gabapentin  800 mg Oral TID   insulin aspart  0-20 Units Subcutaneous Q4H   insulin glargine-yfgn  10 Units Subcutaneous QHS   metoprolol succinate  25 mg Oral BID   nortriptyline  50 mg Oral QHS   pantoprazole  40 mg Oral Daily   polyethylene glycol  17 g Oral Daily   tamsulosin  0.4 mg Oral Daily   Continuous Infusions:  sodium chloride 50 mL/hr at 11/17/22 0618     LOS: 5 days    Time spent: 35 minutes    Dorcas Carrow, MD [facility] Pager [phone]

## 2022-11-17 NOTE — Plan of Care (Signed)
  Problem: Education: Goal: Knowledge of General Education information will improve Description: Including pain rating scale, medication(s)/side effects and non-pharmacologic comfort measures Outcome: Progressing   Problem: Clinical Measurements: Goal: Ability to maintain clinical measurements within normal limits will improve Outcome: Progressing Goal: Will remain free from infection Outcome: Progressing Goal: Respiratory complications will improve Outcome: Progressing Goal: Cardiovascular complication will be avoided Outcome: Progressing   Problem: Activity: Goal: Risk for activity intolerance will decrease Outcome: Progressing   Problem: Nutrition: Goal: Adequate nutrition will be maintained Outcome: Progressing   Problem: Coping: Goal: Level of anxiety will decrease Outcome: Progressing   Problem: Pain Managment: Goal: General experience of comfort will improve Outcome: Progressing   Problem: Safety: Goal: Ability to remain free from injury will improve Outcome: Progressing   Problem: Skin Integrity: Goal: Risk for impaired skin integrity will decrease Outcome: Progressing

## 2022-11-17 NOTE — Progress Notes (Signed)
Occupational Therapy Treatment Patient Details Name: Alexander Wilcox MRN: [id number] DOB: 29-Mar-1951 Today's Date: 11/17/2022   History of present illness Pt admitted to [facility] on 11/11/22 for c/o crush injury; riding lawn mower rolled backwards and landed on his chest for about 2-3 minutes. Pt endorses hitting his head on the ground, but no LOC, bil rib pain, L ankle pain, and R knee pain. Imaging significant for T9-10 fracture, T8-9 anterior longitudinal ligament tear,  and L radial head nondisplaced fracture. S/p surgical fixation with T7-12 posterior spinal instrumentation and fusion with ORIF on 5/10. Concern for stroke after surgery due to c/o dysarthria and facial droop; imaging negative and pt not a candidate for tPA due to recent major surgery. Significant PMH includes: arthritis, diabetes, HTN, prostate CA, sleep apnea, cLBP, T2DM with neuropathy, HLD, cervical myelopathy (s/p cervical fusion).   OT comments  Pt received semi-reclined in bed. Appearing alert; willing to work with OT on t/f to recliner. T/f with Corene Cornea non-mechanical sit to stand lift. See flowsheet below for further details of session. Left in recliner; dependent lift sling available; with all needs in reach.  Patient will benefit from continued OT while in acute care.   Recommendations for follow up therapy are one component of a multi-disciplinary discharge planning process, led by the attending physician.  Recommendations may be updated based on patient status, additional functional criteria and insurance authorization.    Assistance Recommended at Discharge Frequent or constant Supervision/Assistance  Patient can return home with the following  Two people to help with walking and/or transfers;Two people to help with bathing/dressing/bathroom;Assistance with cooking/housework;Direct supervision/assist for financial management;Direct supervision/assist for medications management;Assist for transportation;Help with stairs  or ramp for entrance   Equipment Recommendations  Other (comment) (defer to next venue of care)    Recommendations for Other Services      Precautions / Restrictions Precautions Precautions: Back;Fall Required Braces or Orthoses: Other Brace Other Brace: TLSO Restrictions Weight Bearing Restrictions: No Other Position/Activity Restrictions: MD confirmed no R wrist fx present. Bruised/painful ribs and back; s/p back surgery       Mobility Bed Mobility Overal bed mobility: Needs Assistance Bed Mobility: Supine to Sit     Supine to sit: Mod assist, Max assist, +2 for physical assistance (HOB elevated)     General bed mobility comments: HOB elevated    Transfers Overall transfer level: Needs assistance Equipment used:  Huntley Dec STEDY sit to stand (non-mechanical) lift) Transfers: Sit to/from Stand Sit to Stand: Mod assist, +2 physical assistance                 Balance Overall balance assessment: Needs assistance Sitting-balance support: Feet supported, Single extremity supported Sitting balance-Leahy Scale: Fair     Standing balance support: Reliant on assistive device for balance, Bilateral upper extremity supported Huntley Dec STEDY non-mechanical sit to stand lift) Standing balance-Leahy Scale: Poor                             ADL either performed or assessed with clinical judgement   ADL Overall ADL's : Needs assistance/impaired                                     Functional mobility during ADLs: +2 for physical assistance (Utilized sit to stand lift (non-mechanical, Corene Cornea) today for standing and transferring to chair; were going to t/f  to [facility] to practice, but pt with decreased activity tolerance; stated he was up to the [facility] with +3 assist earlier today with nursing.) General ADL Comments: Pt overall requires extensive assist with ADLs due to decreased mobility. Able to perform tasks with UE when at bed level, but any sitting up or  standing pt needs UE support.    Extremity/Trunk Assessment Upper Extremity Assessment Upper Extremity Assessment: Generalized weakness   Lower Extremity Assessment Lower Extremity Assessment: Generalized weakness (noting difficulty with moving L foot onto STEDY platform)        Vision       Perception     Praxis      Cognition Arousal/Alertness: Awake/alert Behavior During Therapy: WFL for tasks assessed/performed Overall Cognitive Status: Within Functional Limits for tasks assessed                                 General Comments: Follows instructions, motivated        Exercises      Shoulder Instructions       General Comments On room air.    Pertinent Vitals/ Pain       Pain Assessment Pain Assessment: 0-10 Pain Score: 8  Pain Location: back and ribs while laying in the bed with HOB elevated Pain Descriptors / Indicators: Aching, Grimacing Pain Intervention(s): Limited activity within patient's tolerance, Monitored during session, Repositioned  Home Living                                          Prior Functioning/Environment              Frequency  Min 1X/week        Progress Toward Goals  OT Goals(current goals can now be found in the care plan section)  Progress towards OT goals: Progressing toward goals  Acute Rehab OT Goals Patient Stated Goal: Get better; pain to improve OT Goal Formulation: With patient Time For Goal Achievement: 11/27/22 Potential to Achieve Goals: Fair ADL Goals Pt Will Perform Eating: bed level;with set-up Pt Will Perform Grooming: with set-up;bed level Pt Will Perform Lower Body Bathing: with min assist;sit to/from stand Pt Will Perform Lower Body Dressing: with min assist;with adaptive equipment;sit to/from stand Pt Will Transfer to Toilet: with mod assist;bedside commode;stand pivot transfer  Plan Discharge plan remains appropriate;Frequency remains appropriate     Co-evaluation    PT/OT/SLP Co-Evaluation/Treatment: Yes Reason for Co-Treatment: Complexity of the patient's impairments (multi-system involvement);For patient/therapist safety   OT goals addressed during session: ADL's and self-care;Strengthening/ROM      AM-PAC OT "6 Clicks" Daily Activity     Outcome Measure   Help from another person eating meals?: None Help from another person taking care of personal grooming?: A Little Help from another person toileting, which includes using toliet, bedpan, or urinal?: Total Help from another person bathing (including washing, rinsing, drying)?: Total Help from another person to put on and taking off regular upper body clothing?: A Lot Help from another person to put on and taking off regular lower body clothing?: Total 6 Click Score: 12    End of Session Equipment Utilized During Treatment: Gait belt;Other (comment) Huntley Dec STEDY non-mechanical sit to stand lift)  OT Visit Diagnosis: Muscle weakness (generalized) (M62.81);Pain   Activity Tolerance Patient limited by pain   Patient Left in chair;with  chair alarm set;with call bell/phone within reach;with family/visitor present;Other (comment) (family in hallway)   Nurse Communication Mobility status;Other (comment) (pt in chair; dependent lift pad available for t/f back to bed)        Time: 4540-9811 OT Time Calculation (min): 23 min  Charges: OT General Charges $OT Visit: 1 Visit OT Treatments $Therapeutic Activity: 8-22 mins  Linward Foster, MS, OTR/L  Alvester Morin 11/17/2022, 3:33 PM

## 2022-11-17 NOTE — Progress Notes (Signed)
Attending Progress Note  History: Alexander Wilcox is here for thoracic spinal cord fusion.  He unfortunately suffered a thoracic spinal fracture.  He was taken to the OR yesterday for reduction and fixation.  POD 0: Developed left sided weakness without any sensation changes.  Neurology was consulted for possibility of stroke given some left-sided facial weakness as well.  He underwent a thoracic MRI as well as a brain MRI with no acute findings.  POD1: Continues to have back pain as well as rib pain throughout.  He has had improved lower extremity function over the night.  Continues to have some mild facial asymmetry, however difficulty concerning whether or not this is due to some swelling in his lips.  POD 3: Continues to have back pain as expected.  Rib pain is stable.  He feels that his left lower extremity continues to be stable in its overall function.  He did not have any more episodes of complete loss of function in his left lower extremity yesterday.  POD4: Pt attempting to use the bedpan  POD5: Continues to have abdominal distention and pain as a result. Currently on the bedpan. Reports 3-4 small Bms this morning   POD6: improved abdominal pain. Feels improvement in LLE   Physical Exam: Vitals:   11/17/22 0003 11/17/22 0749  BP: 136/77 129/63  Pulse: 86 74  Resp: 20 18  Temp: 98 F (36.7 C) 98.3 F (36.8 C)  SpO2: 96% 96%    AA Ox3 CNI, continued left-sided facial asymmetry.   Strength: 4/5 DF, PF, 3/5 EHL, 3/5 HF and 2/5 KE on the left. 4+/5 throughout on the right  Incision covered with dressing  Data:  Recent Labs  Lab 11/12/22 0424 11/14/22 0514 11/17/22 0449  NA 134* 132* 131*  K 3.8 3.7 3.6  CL 100 100 100  CO2 23 25 22   BUN 35* 17 22  CREATININE 1.42* 1.01 1.00  GLUCOSE 169* 188* 184*  CALCIUM 9.3 8.7* 8.6*    Recent Labs  Lab 11/12/22 0424  AST 39  ALT 44  ALKPHOS 73      Recent Labs  Lab 11/14/22 0514 11/15/22 0420 11/17/22 0449   WBC 8.1 7.6 7.1  HGB 11.8* 11.3* 11.7*  HCT 34.4* 33.1* 33.1*  PLT 154 150 202    Recent Labs  Lab 11/12/22 0135  INR 1.1          Other tests/results:  Narrative & Impression  CLINICAL DATA:  Facial droop   EXAM: MRI HEAD WITHOUT CONTRAST   TECHNIQUE: Multiplanar, multiecho pulse sequences of the brain and surrounding structures were obtained without intravenous contrast.   COMPARISON:  None Available.   FINDINGS: Brain: No acute infarct, mass effect or extra-axial collection. No acute or chronic hemorrhage. Normal white matter signal, parenchymal volume and CSF spaces. The midline structures are normal.   Vascular: Major flow voids are preserved.   Skull and upper cervical spine: Normal calvarium and skull base. Visualized upper cervical spine and soft tissues are normal.   Sinuses/Orbits:No paranasal sinus fluid levels or advanced mucosal thickening. No mastoid or middle ear effusion. Normal orbits.   IMPRESSION: Normal brain MRI.     Electronically Signed   By: Deatra Robinson M.D.   On: 11/12/2022 22:13   arrative & Impression  CLINICAL DATA:  Trauma   EXAM: MRI THORACIC SPINE WITHOUT CONTRAST   TECHNIQUE: Multiplanar, multisequence MR imaging of the thoracic spine was performed. No intravenous contrast was administered.   COMPARISON:  11/11/2022 thoracic spine CT and 11/12/2022 preoperative thoracic spine MRI   FINDINGS: Alignment:  Physiologic.   Vertebrae: There is now posterior instrumented fusion extending from T7-T12, traversing the site of T9-10 fracture. No new osseous abnormality.   Cord:  Normal signal and morphology.   Paraspinal and other soft tissues: Negative.   Disc levels:   Unchanged severe narrowing of the thecal sac at T6-7 and T7-8   IMPRESSION: Posterior instrumented fusion extending from T7-T12, traversing the site of T9-10 fracture. Alignment is normal.     Electronically Signed   By: Deatra Robinson M.D.    On: 11/12/2022 22:11   Assessment/Plan:  [name] is a 72 year old man with a history of DISH arthritis.  He suffered a fall and developed a 3 column thoracic spinal fracture.  He was taken to the OR for open decompression and fusion with reduction of his fracture.  - mobilize - pain control - DVT prophylaxis - PTOT, bracing while out of bed. - Avoid hypotension, currently auto mapping for his spinal cord health. - wound vac removed on 5/14. Dressing can be removed and incision left open to air on 5/17.  Manning Charity PA-C Department of Neurosurgery

## 2022-11-17 NOTE — Plan of Care (Signed)
  Problem: Education: Goal: Knowledge of General Education information will improve Description: Including pain rating scale, medication(s)/side effects and non-pharmacologic comfort measures Outcome: Progressing   Problem: Health Behavior/Discharge Planning: Goal: Ability to manage health-related needs will improve Outcome: Progressing   Problem: Clinical Measurements: Goal: Ability to maintain clinical measurements within normal limits will improve Outcome: Progressing Goal: Diagnostic test results will improve Outcome: Progressing Goal: Respiratory complications will improve Outcome: Progressing Goal: Cardiovascular complication will be avoided Outcome: Progressing   Problem: Activity: Goal: Risk for activity intolerance will decrease Outcome: Progressing   Problem: Nutrition: Goal: Adequate nutrition will be maintained Outcome: Progressing   Problem: Elimination: Goal: Will not experience complications related to bowel motility Outcome: Progressing   Problem: Pain Managment: Goal: General experience of comfort will improve Outcome: Progressing

## 2022-11-17 NOTE — Progress Notes (Signed)
Physical Therapy Treatment Patient Details Name: Alexander Wilcox MRN: [id number] DOB: [birth year] Today's Date: 11/17/2022   History of Present Illness Pt admitted to [facility] on 11/11/22 for c/o crush injury; riding lawn mower rolled backwards and landed on his chest for about 2-3 minutes. Pt endorses hitting his head on the ground, but no LOC, bil rib pain, L ankle pain, and R knee pain. Imaging significant for T9-10 fracture, T8-9 anterior longitudinal ligament tear,  and L radial head nondisplaced fracture. S/p surgical fixation with T7-12 posterior spinal instrumentation and fusion with ORIF on 5/10. Concern for stroke after surgery due to c/o dysarthria and facial droop; imaging negative and pt not a candidate for tPA due to recent major surgery. Significant PMH includes: arthritis, diabetes, HTN, prostate CA, sleep apnea, cLBP, T2DM with neuropathy, HLD, cervical myelopathy (s/p cervical fusion).    PT Comments    Pt laying in bed on arrival, eager to try getting up to EOB and moving but remains very weak in LEs (L weaker than R). He showed great effort in getting up to EOB, heavy use of PT and OTs hands to pull up after initially getting some trunk elevation and hip rotation toward EOB with rails.  Heavy assist to scoot forward to EOB but able to maintain sitting balance EOB while donning TLSO and prepping for transfer with STEDY. Pt very fatigued post session, requests to get back to bed from recliner just minutes after getting into (and requesting) recliner.  Loosened TLSO to relieve some rib pressure and he was agreeable to sitting up for a while.  Good overall effort, pt remains very weak L>R LE, continue with POC.   Recommendations for follow up therapy are one component of a multi-disciplinary discharge planning process, led by the attending physician.  Recommendations may be updated based on patient status, additional functional criteria and insurance authorization.  Follow Up Recommendations   Can patient physically be transported by private vehicle: No    Assistance Recommended at Discharge Frequent or constant Supervision/Assistance  Patient can return home with the following Two people to help with walking and/or transfers;Two people to help with bathing/dressing/bathroom;Assistance with cooking/housework;Direct supervision/assist for medications management;Direct supervision/assist for financial management;Assist for transportation;Help with stairs or ramp for entrance   Equipment Recommendations   (TBD at next level of care)    Recommendations for Other Services       Precautions / Restrictions Precautions Precautions: Back;Fall Required Braces or Orthoses: Other Brace Other Brace: TLSO Restrictions Weight Bearing Restrictions: No Other Position/Activity Restrictions: MD confirmed no R wrist fx present. Bruised/painful ribs and back; s/p back surgery     Mobility  Bed Mobility Overal bed mobility: Needs Assistance Bed Mobility: Rolling, Supine to Sit, Sit to Supine     Supine to sit: Mod assist, Max assist, +2 for physical assistance     General bed mobility comments: HOB elevated, pt able to initiate movement, help to hold rails then heavy HHA +2 to allow him to help pull up.  Heavy assist to get to square with EOB    Transfers Overall transfer level: Needs assistance Equipment used:  Corene Cornea) Transfers: Sit to/from Stand Sit to Stand: Mod assist, +2 safety/equipment, From elevated surface           General transfer comment: Pt showed great effort using UEs on STEDY, ultimately did need +2 heavy assist to elevate enough to get seat panels under him    Ambulation/Gait  General Gait Details: unsafe to attempt   Stairs             Wheelchair Mobility    Modified Rankin (Stroke Patients Only)       Balance Overall balance assessment: Needs assistance                                           Cognition Arousal/Alertness: Awake/alert Behavior During Therapy: WFL for tasks assessed/performed Overall Cognitive Status: Within Functional Limits for tasks assessed                                          Exercises General Exercises - Lower Extremity Ankle Circles/Pumps: AAROM, AROM, 10 reps Quad Sets: Strengthening, AROM, 10 reps Heel Slides: AAROM, 10 reps (with resisted leg ext per tolerance) Hip ABduction/ADduction: AROM, 5 reps    General Comments General comments (skin integrity, edema, etc.): On room air.      Pertinent Vitals/Pain Pain Assessment Pain Assessment: 0-10 Pain Score: 8  Pain Location: ribs, back 6/10, TLSO causing some of the increased pain    Home Living                          Prior Function            PT Goals (current goals can now be found in the care plan section) Progress towards PT goals: Progressing toward goals    Frequency    Min 2X/week      PT Plan Current plan remains appropriate    Co-evaluation PT/OT/SLP Co-Evaluation/Treatment: Yes Reason for Co-Treatment: Complexity of the patient's impairments (multi-system involvement);For patient/therapist safety PT goals addressed during session: Mobility/safety with mobility;Balance;Strengthening/ROM;Proper use of DME OT goals addressed during session: ADL's and self-care;Strengthening/ROM      AM-PAC PT "6 Clicks" Mobility   Outcome Measure  Help needed turning from your back to your side while in a flat bed without using bedrails?: A Lot Help needed moving from lying on your back to sitting on the side of a flat bed without using bedrails?: Total Help needed moving to and from a bed to a chair (including a wheelchair)?: Total Help needed standing up from a chair using your arms (e.g., wheelchair or bedside chair)?: A Lot Help needed to walk in hospital room?: Total Help needed climbing 3-5 steps with a railing? : Total 6 Click Score: 8     End of Session Equipment Utilized During Treatment: Gait belt;Back brace Activity Tolerance: Patient limited by fatigue;Patient limited by pain Patient left: with call bell/phone within reach;with chair alarm set;with family/visitor present (lift sling in recliner) Nurse Communication: Mobility status;Need for lift equipment PT Visit Diagnosis: Unsteadiness on feet (R26.81);Muscle weakness (generalized) (M62.81);Difficulty in walking, not elsewhere classified (R26.2);Pain Pain - Right/Left: Left Pain - part of body: Knee (back, ribs)     Time: 1610-9604 PT Time Calculation (min) (ACUTE ONLY): 48 min  Charges:  $Therapeutic Exercise: 8-22 mins $Therapeutic Activity: 8-22 mins                     Malachi Pro, DPT 11/17/2022, 4:08 PM

## 2022-11-18 DIAGNOSIS — R319 Hematuria, unspecified: Secondary | ICD-10-CM | POA: Diagnosis not present

## 2022-11-18 DIAGNOSIS — A419 Sepsis, unspecified organism: Secondary | ICD-10-CM | POA: Diagnosis not present

## 2022-11-18 DIAGNOSIS — N3289 Other specified disorders of bladder: Secondary | ICD-10-CM | POA: Diagnosis present

## 2022-11-18 DIAGNOSIS — G629 Polyneuropathy, unspecified: Secondary | ICD-10-CM | POA: Diagnosis not present

## 2022-11-18 DIAGNOSIS — Z09 Encounter for follow-up examination after completed treatment for conditions other than malignant neoplasm: Secondary | ICD-10-CM | POA: Diagnosis not present

## 2022-11-18 DIAGNOSIS — R54 Age-related physical debility: Secondary | ICD-10-CM | POA: Diagnosis present

## 2022-11-18 DIAGNOSIS — Y846 Urinary catheterization as the cause of abnormal reaction of the patient, or of later complication, without mention of misadventure at the time of the procedure: Secondary | ICD-10-CM | POA: Diagnosis present

## 2022-11-18 DIAGNOSIS — I6501 Occlusion and stenosis of right vertebral artery: Secondary | ICD-10-CM | POA: Diagnosis not present

## 2022-11-18 DIAGNOSIS — R6520 Severe sepsis without septic shock: Secondary | ICD-10-CM | POA: Diagnosis not present

## 2022-11-18 DIAGNOSIS — D62 Acute posthemorrhagic anemia: Secondary | ICD-10-CM | POA: Diagnosis not present

## 2022-11-18 DIAGNOSIS — I10 Essential (primary) hypertension: Secondary | ICD-10-CM | POA: Diagnosis not present

## 2022-11-18 DIAGNOSIS — D649 Anemia, unspecified: Secondary | ICD-10-CM | POA: Diagnosis not present

## 2022-11-18 DIAGNOSIS — E860 Dehydration: Secondary | ICD-10-CM | POA: Diagnosis present

## 2022-11-18 DIAGNOSIS — I82442 Acute embolism and thrombosis of left tibial vein: Secondary | ICD-10-CM | POA: Diagnosis present

## 2022-11-18 DIAGNOSIS — N3090 Cystitis, unspecified without hematuria: Secondary | ICD-10-CM | POA: Diagnosis not present

## 2022-11-18 DIAGNOSIS — Z8546 Personal history of malignant neoplasm of prostate: Secondary | ICD-10-CM | POA: Diagnosis not present

## 2022-11-18 DIAGNOSIS — I82402 Acute embolism and thrombosis of unspecified deep veins of left lower extremity: Secondary | ICD-10-CM | POA: Diagnosis not present

## 2022-11-18 DIAGNOSIS — W19XXXD Unspecified fall, subsequent encounter: Secondary | ICD-10-CM | POA: Diagnosis not present

## 2022-11-18 DIAGNOSIS — E871 Hypo-osmolality and hyponatremia: Secondary | ICD-10-CM | POA: Diagnosis not present

## 2022-11-18 DIAGNOSIS — X58XXXD Exposure to other specified factors, subsequent encounter: Secondary | ICD-10-CM | POA: Diagnosis present

## 2022-11-18 DIAGNOSIS — S22078D Other fracture of T9-T10 vertebra, subsequent encounter for fracture with routine healing: Secondary | ICD-10-CM | POA: Diagnosis not present

## 2022-11-18 DIAGNOSIS — G9341 Metabolic encephalopathy: Secondary | ICD-10-CM | POA: Diagnosis not present

## 2022-11-18 DIAGNOSIS — N390 Urinary tract infection, site not specified: Secondary | ICD-10-CM | POA: Diagnosis not present

## 2022-11-18 DIAGNOSIS — Z794 Long term (current) use of insulin: Secondary | ICD-10-CM | POA: Diagnosis not present

## 2022-11-18 DIAGNOSIS — Z7984 Long term (current) use of oral hypoglycemic drugs: Secondary | ICD-10-CM | POA: Diagnosis not present

## 2022-11-18 DIAGNOSIS — G4733 Obstructive sleep apnea (adult) (pediatric): Secondary | ICD-10-CM | POA: Diagnosis not present

## 2022-11-18 DIAGNOSIS — I2699 Other pulmonary embolism without acute cor pulmonale: Secondary | ICD-10-CM | POA: Diagnosis not present

## 2022-11-18 DIAGNOSIS — K567 Ileus, unspecified: Secondary | ICD-10-CM | POA: Diagnosis not present

## 2022-11-18 DIAGNOSIS — N3091 Cystitis, unspecified with hematuria: Secondary | ICD-10-CM | POA: Diagnosis present

## 2022-11-18 DIAGNOSIS — R739 Hyperglycemia, unspecified: Secondary | ICD-10-CM | POA: Diagnosis not present

## 2022-11-18 DIAGNOSIS — S22070A Wedge compression fracture of T9-T10 vertebra, initial encounter for closed fracture: Secondary | ICD-10-CM | POA: Diagnosis not present

## 2022-11-18 DIAGNOSIS — R4182 Altered mental status, unspecified: Secondary | ICD-10-CM | POA: Diagnosis not present

## 2022-11-18 DIAGNOSIS — R310 Gross hematuria: Secondary | ICD-10-CM | POA: Diagnosis not present

## 2022-11-18 DIAGNOSIS — N320 Bladder-neck obstruction: Secondary | ICD-10-CM | POA: Diagnosis not present

## 2022-11-18 DIAGNOSIS — T83518A Infection and inflammatory reaction due to other urinary catheter, initial encounter: Secondary | ICD-10-CM | POA: Diagnosis not present

## 2022-11-18 DIAGNOSIS — N179 Acute kidney failure, unspecified: Secondary | ICD-10-CM | POA: Diagnosis present

## 2022-11-18 DIAGNOSIS — R000 Tachycardia, unspecified: Secondary | ICD-10-CM | POA: Diagnosis not present

## 2022-11-18 DIAGNOSIS — Z79899 Other long term (current) drug therapy: Secondary | ICD-10-CM | POA: Diagnosis not present

## 2022-11-18 DIAGNOSIS — M532X4 Spinal instabilities, thoracic region: Secondary | ICD-10-CM | POA: Diagnosis not present

## 2022-11-18 DIAGNOSIS — I700 Atherosclerosis of aorta: Secondary | ICD-10-CM | POA: Diagnosis not present

## 2022-11-18 DIAGNOSIS — E1165 Type 2 diabetes mellitus with hyperglycemia: Secondary | ICD-10-CM | POA: Diagnosis not present

## 2022-11-18 DIAGNOSIS — T83511A Infection and inflammatory reaction due to indwelling urethral catheter, initial encounter: Secondary | ICD-10-CM | POA: Diagnosis not present

## 2022-11-18 DIAGNOSIS — E1142 Type 2 diabetes mellitus with diabetic polyneuropathy: Secondary | ICD-10-CM | POA: Diagnosis not present

## 2022-11-18 DIAGNOSIS — E669 Obesity, unspecified: Secondary | ICD-10-CM | POA: Diagnosis present

## 2022-11-18 DIAGNOSIS — Z981 Arthrodesis status: Secondary | ICD-10-CM | POA: Diagnosis not present

## 2022-11-18 DIAGNOSIS — Z87448 Personal history of other diseases of urinary system: Secondary | ICD-10-CM | POA: Diagnosis not present

## 2022-11-18 DIAGNOSIS — E785 Hyperlipidemia, unspecified: Secondary | ICD-10-CM | POA: Diagnosis present

## 2022-11-18 DIAGNOSIS — Z6838 Body mass index (BMI) 38.0-38.9, adult: Secondary | ICD-10-CM | POA: Diagnosis not present

## 2022-11-18 DIAGNOSIS — B9620 Unspecified Escherichia coli [E. coli] as the cause of diseases classified elsewhere: Secondary | ICD-10-CM | POA: Diagnosis present

## 2022-11-18 DIAGNOSIS — I959 Hypotension, unspecified: Secondary | ICD-10-CM | POA: Diagnosis not present

## 2022-11-18 LAB — GLUCOSE, CAPILLARY
Glucose-Capillary: 160 mg/dL — ABNORMAL HIGH (ref 70–99)
Glucose-Capillary: 168 mg/dL — ABNORMAL HIGH (ref 70–99)
Glucose-Capillary: 172 mg/dL — ABNORMAL HIGH (ref 70–99)
Glucose-Capillary: 181 mg/dL — ABNORMAL HIGH (ref 70–99)

## 2022-11-18 MED ORDER — OXYCODONE HCL 10 MG PO TABS: 10.0000 mg | ORAL_TABLET | ORAL | 0 refills | Status: AC | PRN

## 2022-11-18 MED ORDER — DOCUSATE SODIUM 100 MG PO CAPS: 100.0000 mg | ORAL_CAPSULE | Freq: Two times a day (BID) | ORAL | 2 refills | Status: DC

## 2022-11-18 MED ORDER — INSULIN GLARGINE-YFGN 100 UNIT/ML ~~LOC~~ SOLN: 10.0000 [IU] | Freq: Every day | SUBCUTANEOUS | 11 refills | Status: DC

## 2022-11-18 MED ORDER — METHOCARBAMOL 750 MG PO TABS: 750.0000 mg | ORAL_TABLET | Freq: Four times a day (QID) | ORAL | 0 refills | Status: AC | PRN

## 2022-11-18 MED ORDER — POLYETHYLENE GLYCOL 3350 17 G PO PACK: 17.0000 g | PACK | Freq: Every day | ORAL | 0 refills | Status: DC

## 2022-11-18 MED ORDER — PREGABALIN 75 MG PO CAPS: 75.0000 mg | ORAL_CAPSULE | Freq: Two times a day (BID) | ORAL | 0 refills | Status: DC

## 2022-11-18 MED ORDER — ACETAMINOPHEN 325 MG PO TABS: 650.0000 mg | ORAL_TABLET | Freq: Four times a day (QID) | ORAL | Status: DC | PRN

## 2022-11-18 MED ORDER — DIPHENHYDRAMINE HCL 25 MG PO CAPS: 25.0000 mg | ORAL_CAPSULE | Freq: Three times a day (TID) | ORAL | 0 refills | Status: DC | PRN

## 2022-11-18 MED ORDER — GABAPENTIN 800 MG PO TABS: 800.0000 mg | ORAL_TABLET | Freq: Three times a day (TID) | ORAL | 0 refills | Status: DC

## 2022-11-18 NOTE — Care Management Important Message (Signed)
Important Message  Patient Details  Name: Alexander Wilcox MRN: [id number] Date of Birth: 1951/05/10   Medicare Important Message Given:  Yes     Olegario Messier A Jaiyanna Safran 11/18/2022, 10:38 AM

## 2022-11-18 NOTE — TOC Progression Note (Addendum)
Transition of Care [facility]) - Progression Note    Patient Details  Name: Alexander Wilcox MRN: [id number] Date of Birth: 07/18/1950  Transition of Care [facility]) CM/SW Contact  Marlowe Sax, RN Phone Number: 11/18/2022, 1:49 PM  Clinical Narrative:   Elmarie Shiley with [facility] called and stated that the patient has a very expensive Cancer medication and due to the cost they are not able to accept him on that medication unless he is able to go without it, I asked if he could bring the medication from home and she stated that due to medicare guidelines that are not allowed to have the patient provide the medication I notified the Physician and the wife, the wife stated that he has been off of it for 4 weeks, I notified the doctor and called [facility] I explained that the patient has not taken the medication for 4 weeks and will be removed from dc summary he will still go to [facility] today EMS called to arrange for transport to go to room 505 at [facility]    Expected Discharge Plan: Skilled Nursing Facility Barriers to Discharge: SNF Pending bed offer, Insurance Authorization  Expected Discharge Plan and Services       Living arrangements for the past 2 months: Single Family Home Expected Discharge Date: 11/18/22                                     Social Determinants of Health (SDOH) Interventions SDOH Screenings   Food Insecurity: No Food Insecurity (11/12/2022)  Housing: Low Risk  (11/12/2022)  Transportation Needs: No Transportation Needs (11/12/2022)  Utilities: Not At Risk (11/12/2022)  Tobacco Use: Medium Risk (11/15/2022)    Readmission Risk Interventions     No data to display

## 2022-11-18 NOTE — Plan of Care (Signed)
  Problem: Education: Goal: Knowledge of General Education information will improve Description: Including pain rating scale, medication(s)/side effects and non-pharmacologic comfort measures Outcome: Progressing   Problem: Health Behavior/Discharge Planning: Goal: Ability to manage health-related needs will improve Outcome: Progressing   Problem: Clinical Measurements: Goal: Ability to maintain clinical measurements within normal limits will improve Outcome: Progressing Goal: Diagnostic test results will improve Outcome: Progressing   Problem: Activity: Goal: Risk for activity intolerance will decrease Outcome: Progressing   Problem: Nutrition: Goal: Adequate nutrition will be maintained Outcome: Progressing   Problem: Pain Managment: Goal: General experience of comfort will improve Outcome: Progressing   Problem: Safety: Goal: Ability to remain free from injury will improve Outcome: Progressing

## 2022-11-18 NOTE — TOC Progression Note (Signed)
Transition of Care [facility]) - Progression Note    Patient Details  Name: Alexander Wilcox MRN: [id number] Date of Birth: 03/15/1951  [facility]) CM/SW Contact  Marlowe Sax, RN Phone Number: 11/18/2022, 10:55 AM  Clinical Narrative:  Spoke to the patient's wife [name] and let her know that he will DC to [facility] today    Expected Discharge Plan: Skilled Nursing Facility Barriers to Discharge: SNF Pending bed offer, Insurance Authorization  Expected Discharge Plan and Services       Living arrangements for the past 2 months: Single Family Home Expected Discharge Date: 11/18/22                                     Social Determinants of Health (SDOH) Interventions SDOH Screenings   Food Insecurity: No Food Insecurity (11/12/2022)  Housing: Low Risk  (11/12/2022)  Transportation Needs: No Transportation Needs (11/12/2022)  Utilities: Not At Risk (11/12/2022)  Tobacco Use: Medium Risk (11/15/2022)    Readmission Risk Interventions     No data to display

## 2022-11-18 NOTE — Plan of Care (Signed)

## 2022-11-18 NOTE — TOC Progression Note (Signed)
[facility] - [facility]    Patient Details  Name: Alexander Wilcox MRN: [id number] Date of Birth: 28-Jul-1950  Transition of Care [facility]) CM/SW Contact  Marlowe Sax, RN Phone Number: 11/18/2022, 1:24 PM  Clinical Narrative:   Called EMS to arrange transport to [facility] Wife is aware and will take his cpap to [facility] he is number 5 on transport list    Expected Discharge Plan: Skilled Nursing Facility Barriers to Discharge: SNF Pending bed offer, Insurance Authorization  Expected Discharge Plan and Services       Living arrangements for the past 2 months: Single Family Home Expected Discharge Date: 11/18/22                                     Social Determinants of Health (SDOH) Interventions SDOH Screenings   Food Insecurity: No Food Insecurity (11/12/2022)  Housing: Low Risk  (11/12/2022)  Transportation Needs: No Transportation Needs (11/12/2022)  Utilities: Not At Risk (11/12/2022)  Tobacco Use: Medium Risk (11/15/2022)    Readmission Risk Interventions     No data to display

## 2022-11-18 NOTE — Progress Notes (Signed)
Physical Therapy Treatment Patient Details Name: Alexander Wilcox MRN: [id number] DOB: 01-04-1951 Today's Date: 11/18/2022   History of Present Illness Pt admitted to [facility] on 11/11/22 for c/o crush injury; riding lawn mower rolled backwards and landed on his chest for about 2-3 minutes. Pt endorses hitting his head on the ground, but no LOC, bil rib pain, L ankle pain, and R knee pain. Imaging significant for T9-10 fracture, T8-9 anterior longitudinal ligament tear,  and L radial head nondisplaced fracture. S/p surgical fixation with T7-12 posterior spinal instrumentation and fusion with ORIF on 5/10. Concern for stroke after surgery due to c/o dysarthria and facial droop; imaging negative and pt not a candidate for tPA due to recent major surgery. Significant PMH includes: arthritis, diabetes, HTN, prostate CA, sleep apnea, cLBP, T2DM with neuropathy, HLD, cervical myelopathy (s/p cervical fusion).    PT Comments    Pt pleasant and motivated but continues to have very weak LEs (L weaker than R) with continued need for significant assist for bed mobility and transfers.  He was better able to assist with rise to standing in the STEDY this date and though still needing heavy +2 assist for transfers again was able to show improved effort.  Overall pt remains functionally limited but highly motivated and pleasant t/o session.  Continue with PT POC per pt tolerance.   Recommendations for follow up therapy are one component of a multi-disciplinary discharge planning process, led by the attending physician.  Recommendations may be updated based on patient status, additional functional criteria and insurance authorization.  Follow Up Recommendations  Can patient physically be transported by private vehicle: No    Assistance Recommended at Discharge Frequent or constant Supervision/Assistance  Patient can return home with the following Two people to help with walking and/or transfers;Two people to help with  bathing/dressing/bathroom;Assistance with cooking/housework;Direct supervision/assist for medications management;Direct supervision/assist for financial management;Assist for transportation;Help with stairs or ramp for entrance   Equipment Recommendations   (TBD at rehab)    Recommendations for Other Services       Precautions / Restrictions Precautions Precautions: Back;Fall Required Braces or Orthoses: Other Brace Other Brace: TLSO Restrictions Weight Bearing Restrictions: No     Mobility  Bed Mobility Overal bed mobility: Needs Assistance Bed Mobility: Supine to Sit     Supine to sit: Mod assist, +2 for physical assistance Sit to supine: Max assist, +2 for physical assistance   General bed mobility comments: HOB elevated    Transfers Overall transfer level: Needs assistance Equipment used:  (STEDY) Transfers: Sit to/from Stand, Bed to chair/wheelchair/BSC Sit to Stand: Mod assist, +2 physical assistance           General transfer comment: Pt showed great effort using UEs pulling up on STEDY, heavy but less than yesterday +2 assist to elevate enough to get seat panels under him    Ambulation/Gait                   Stairs             Wheelchair Mobility    Modified Rankin (Stroke Patients Only)       Balance Overall balance assessment: Needs assistance Sitting-balance support: Bilateral upper extremity supported Sitting balance-Leahy Scale: Fair Sitting balance - Comments: intermittent posterior lean in sitting requiring BUE support for steadying; assist ranging from CGA-mod assist, needing less assist once assisted and squared to EOB  Cognition Arousal/Alertness: Awake/alert Behavior During Therapy: WFL for tasks assessed/performed Overall Cognitive Status: Within Functional Limits for tasks assessed                                 General Comments: Follows instructions,  motivated        Exercises      General Comments        Pertinent Vitals/Pain Pain Assessment Pain Score: 6  Pain Location: back and ribs Pain Intervention(s): Limited activity within patient's tolerance    Home Living                          Prior Function            PT Goals (current goals can now be found in the care plan section) Progress towards PT goals: Progressing toward goals    Frequency    Min 2X/week      PT Plan Current plan remains appropriate    Co-evaluation              AM-PAC PT "6 Clicks" Mobility   Outcome Measure  Help needed turning from your back to your side while in a flat bed without using bedrails?: A Lot Help needed moving from lying on your back to sitting on the side of a flat bed without using bedrails?: Total Help needed moving to and from a bed to a chair (including a wheelchair)?: Total Help needed standing up from a chair using your arms (e.g., wheelchair or bedside chair)?: A Lot Help needed to walk in hospital room?: Total Help needed climbing 3-5 steps with a railing? : Total 6 Click Score: 8    End of Session Equipment Utilized During Treatment: Gait belt;Back brace Activity Tolerance: Patient limited by fatigue;Patient limited by pain Patient left: with bed alarm set;with call bell/phone within reach;with family/visitor present Nurse Communication: Mobility status;Need for lift equipment PT Visit Diagnosis: Unsteadiness on feet (R26.81);Muscle weakness (generalized) (M62.81);Difficulty in walking, not elsewhere classified (R26.2);Pain Pain - Right/Left: Left Pain - part of body: Knee (back and ribs)     Time: 4098-1191 PT Time Calculation (min) (ACUTE ONLY): 27 min  Charges:  $Therapeutic Activity: 23-37 mins                     Malachi Pro, DPT 11/18/2022, 1:18 PM

## 2022-11-18 NOTE — Discharge Summary (Addendum)
Physician Discharge Summary  Alexander Wilcox ZOX:[id number] DOB: 08-Feb-1951 DOA: 11/11/2022  PCP: Marisue Ivan, MD  Admit date: 11/11/2022 Discharge date: 11/18/2022  Admitted From: Home Disposition: Skilled nursing facility  Recommendations for Outpatient Follow-up:  Follow up with PCP in 1-2 weeks Please obtain BMP/CBC/magnesium/phosphorus in one week Neurosurgery to schedule follow-up  Discharge Condition: Fair CODE STATUS: Full code Diet recommendation: Low-salt and low-carb diet, regular consistency.  Laxatives to avoid constipation.  Discharge summary: 72 y.o. male with medical history significant for HTN, insulin-dependent type 2 diabetes with neuropathy, OSA on CPAP, HLD, prostate cancer on leuprolide, failed back surgical syndrome, and cervical myelopathy s/p cervical fusion who presented to the ED for evaluation of acute back pain related to trauma when his riding lawn mower rolled backwards off the trailer, knocking him down, landing on his torso and left hip and pinning him to the ground.  He hit his head but did not lose consciousness.  His wife had to power on to mower and drive it off of him.  He was able to get up and cautiously ambulate but he has been complaining of abdominal, chest wall, left elbow ankle and right knee pain. Trauma imaging showed a fracture of T9 on CT imaging and a left radial head nondisplaced fracture.  No other injuries noted on pan scan.   5/10.  Dr. Myer Haff did open reduction internal fixation of T9 fracture and posterolateral arthrodesis T7-T12.  Dr. Richardo Hanks urology placed a Foley catheter for bladder neck contracture requiring dilatation.  patient's wife noticed a left facial droop and intermittent slurred speech.  Case discussed with neurology and a CT angio head and neck ordered.   5/11.  MRI brain negative for stroke.  CT angio head and neck showed stenosis vertebral, internal carotid and basilar arteries.  Will continue aspirin.  Strength  improved from yesterday for his left leg. 5/12.  Started meds for constipation. 5/13.  Added lactulose for constipation.  Patient unable to straight leg raise with left leg . Developed some hematuria overnight. Remains very debilitated, developed ileus.  Now with some clinical improvement.  Will need ongoing rehab.     Assessment & plan of care:   Closed traumatic T9 vertebral body fracture with history of extensive vertebral disease, lumbar laminectomy and cervical fusion and chronic back pain: Suffered trauma.  Emergent surgical stabilization secondary to thoracic instability.  Remains in poor mobility, poor participation to physical therapy. Continue adequate pain medications, oral opiates, Lyrica, gabapentin.  Laxatives.  Continue to work with physical therapy.  Incentive spirometry.   Colonic ileus: Patient developed significant abdomen distention.  No evidence of intestinal obstruction.  Passing flatus.  KUB with significantly dilated colon and air into the rectum. Continue to attempt regular diet. Resume MiraLAX daily to avoid constipation.  Start Colace. Focus on mobility. Potassium, magnesium and phosphorus is adequate.   Some clinical improvement.   Uncontrolled type 2 diabetes with hyperglycemia, long-term use of insulin: Recent known A1c 7.5.  On basal and mealtime insulin.  On gabapentin and duloxetine for peripheral neuropathy. Patient will go back on his home regimen.   Facial droop: Likely secondary to medications or stress.  MRI negative for acute stroke.  CT angiogram with cervical arterial disease.  Continue aspirin.   Essential hypertension: blood pressure controlled on Toprol.   Bladder neck contracture: Foley catheter placed in the operating room by urology.  Discharging with Foley catheter.  Follow-up with urology for voiding trials. Do not remove Foley catheter until his  mobility improves.  There is risk of retaining again.   Sleep apnea: Using CPAP.   Morbid  obesity, BMI more than 40.  He will definitely benefit with weight loss and exercise.   Prostate cancer: On leuprolide every 6 months.   Right radial head fracture: Ruled out.  Weightbearing as tolerated.  Remains in poor clinical status, however is stable and will need to work with mobility and therapies.  Stable to transfer to a skilled level of care.  Addendum: Patient currently not taking apalutamide.  Med rec reviewed to reflect discontinued medications.     Discharge Diagnoses:  Principal Problem:   Closed T9 spinal fracture (HCC) Active Problems:   Uncontrolled type 2 diabetes mellitus with hyperglycemia, with long-term current use of insulin (HCC)   Facial droop   Essential hypertension   Bladder neck contracture   Transaminitis   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   Obesity, Class III, BMI 40-49.9 (morbid obesity) (HCC)   OSA on CPAP   Leukocytosis   Prostate cancer (HCC)   Radial head fracture, closed   Chronic back pain   Thoracic spine instability   Closed tricolumnar fracture of thoracic vertebra (HCC)   Fx dorsal vertebra-closed (HCC)   Hyponatremia   AKI (acute kidney injury) (HCC)   Constipation   Hematuria    Discharge Instructions  Discharge Instructions     Diet - low sodium heart healthy   Complete by: As directed    Discharge instructions   Complete by: As directed    Keep foley catheter until follow up with Urology   Increase activity slowly   Complete by: As directed    No dressing needed   Complete by: As directed       Allergies as of 11/18/2022       Reactions   Dilaudid [hydromorphone Hcl] Itching        Medication List     STOP taking these medications    cyclobenzaprine 10 MG tablet Commonly known as: FLEXERIL   Erleada 60 MG tablet Generic drug: apalutamide   HYDROcodone-acetaminophen 10-325 MG tablet Commonly known as: NORCO   leuprolide acetate (6 Month) 45 MG injection Generic  drug: leuprolide (6 Month)   Procysbi 300 MG Pack Generic drug: Cysteamine Bitartrate   quinapril-hydrochlorothiazide 10-12.5 MG tablet Commonly known as: ACCURETIC   Toujeo Max SoloStar 300 UNIT/ML Solostar Pen Generic drug: insulin glargine (2 Unit Dial)   Toujeo SoloStar 300 UNIT/ML Solostar Pen Generic drug: insulin glargine (1 Unit Dial)       TAKE these medications    acetaminophen 325 MG tablet Commonly known as: TYLENOL Take 2 tablets (650 mg total) by mouth every 6 (six) hours as needed for mild pain (or Fever >/= 101).   aspirin EC 81 MG tablet Take 81 mg by mouth daily.   atorvastatin 40 MG tablet Commonly known as: LIPITOR Take 40 mg by mouth daily.   cyanocobalamin 1000 MCG tablet Take 1,000 mcg by mouth daily.   diphenhydrAMINE 25 mg capsule Commonly known as: BENADRYL Take 1 capsule (25 mg total) by mouth every 8 (eight) hours as needed for itching.   docusate sodium 100 MG capsule Commonly known as: Colace Take 1 capsule (100 mg total) by mouth 2 (two) times daily.   DULoxetine 30 MG capsule Commonly known as: CYMBALTA Take 1 capsule (30 mg total) by mouth daily.   fenofibrate 160 MG tablet TAKE 1 TABLET DAILY   gabapentin 800 MG tablet Commonly known  as: NEURONTIN Take 1 tablet (800 mg total) by mouth 3 (three) times daily.   insulin glargine-yfgn 100 UNIT/ML injection Commonly known as: SEMGLEE Inject 0.1 mLs (10 Units total) into the skin at bedtime.   Lancets 30G Misc 1 each.   lisinopril-hydrochlorothiazide 10-12.5 MG tablet Commonly known as: ZESTORETIC Take 1 tablet by mouth daily.   metFORMIN 500 MG 24 hr tablet Commonly known as: GLUCOPHAGE-XR Take 1 tablet by mouth 2 (two) times daily. Pt taking one in the morning and one at night   methocarbamol 750 MG tablet Commonly known as: ROBAXIN Take 1 tablet (750 mg total) by mouth every 6 (six) hours as needed for up to 7 days for muscle spasms.   metoprolol succinate 25 MG 24  hr tablet Commonly known as: TOPROL-XL Take 25 mg by mouth 2 (two) times daily.   nortriptyline 50 MG capsule Commonly known as: PAMELOR Take 50 mg by mouth at bedtime.   omeprazole 40 MG capsule Commonly known as: PRILOSEC Take 40 mg by mouth daily.   Oxycodone HCl 10 MG Tabs Take 1 tablet (10 mg total) by mouth every 4 (four) hours as needed for up to 5 days for moderate pain.   polyethylene glycol 17 g packet Commonly known as: MIRALAX / GLYCOLAX Take 17 g by mouth daily.   pregabalin 75 MG capsule Commonly known as: LYRICA Take 1 capsule (75 mg total) by mouth 2 (two) times daily.   tamsulosin 0.4 MG Caps capsule Commonly known as: FLOMAX Take 0.4 mg by mouth daily as needed.   UltiGuard SafePack Pen Needle 32G X 4 MM Misc Generic drug: Insulin Pen Needle Use 1 each once daily               Discharge Care Instructions  (From admission, onward)           Start     Ordered   11/18/22 0000  No dressing needed        11/18/22 2130            Follow-up Information     Drake Leach, PA-C Follow up on 11/26/2022.   Specialty: Neurosurgery Contact information: [address] [place] [address] [phone]         Riki Altes, MD Follow up.   Specialty: Urology Why: Friday 11/19/22 for foley removal Contact information: [address] [place] [address] [phone]                Allergies  Allergen Reactions   Dilaudid [Hydromorphone Hcl] Itching    Consultations: neurosurgery Urology   Procedures/Studies: DG Abd 2 Views  Result Date: 11/16/2022 CLINICAL DATA:  Abdominal distension. Recent spine surgery (11/12/2022) EXAM: ABDOMEN - 2 VIEW COMPARISON:  Lumbar CT 11/11/2022 FINDINGS: There is gaseous distention of the colon. Gas in the rectum. No small bowel dilatation identified. No pathologic calcification. Posterior thoracolumbar fusion IMPRESSION: Gaseous distention of the  colon with gas in the rectum. No evidence of high-grade obstruction. Potential colonic ileus. Electronically Signed   By: Genevive Bi M.D.   On: 11/16/2022 15:32   MR BRAIN WO CONTRAST  Result Date: 11/12/2022 CLINICAL DATA:  Facial droop EXAM: MRI HEAD WITHOUT CONTRAST TECHNIQUE: Multiplanar, multiecho pulse sequences of the brain and surrounding structures were obtained without intravenous contrast. COMPARISON:  None Available. FINDINGS: Brain: No acute infarct, mass effect or extra-axial collection. No acute or chronic hemorrhage. Normal white matter signal, parenchymal volume and CSF spaces.  The midline structures are normal. Vascular: Major flow voids are preserved. Skull and upper cervical spine: Normal calvarium and skull base. Visualized upper cervical spine and soft tissues are normal. Sinuses/Orbits:No paranasal sinus fluid levels or advanced mucosal thickening. No mastoid or middle ear effusion. Normal orbits. IMPRESSION: Normal brain MRI. Electronically Signed   By: Deatra Robinson M.D.   On: 11/12/2022 22:13   MR THORACIC SPINE WO CONTRAST  Result Date: 11/12/2022 CLINICAL DATA:  Trauma EXAM: MRI THORACIC SPINE WITHOUT CONTRAST TECHNIQUE: Multiplanar, multisequence MR imaging of the thoracic spine was performed. No intravenous contrast was administered. COMPARISON:  11/11/2022 thoracic spine CT and 11/12/2022 preoperative thoracic spine MRI FINDINGS: Alignment:  Physiologic. Vertebrae: There is now posterior instrumented fusion extending from T7-T12, traversing the site of T9-10 fracture. No new osseous abnormality. Cord:  Normal signal and morphology. Paraspinal and other soft tissues: Negative. Disc levels: Unchanged severe narrowing of the thecal sac at T6-7 and T7-8 IMPRESSION: Posterior instrumented fusion extending from T7-T12, traversing the site of T9-10 fracture. Alignment is normal. Electronically Signed   By: Deatra Robinson M.D.   On: 11/12/2022 22:11   CT ANGIO HEAD NECK W WO  CM  Result Date: 11/12/2022 CLINICAL DATA:  Acute neurologic deficit. EXAM: CT ANGIOGRAPHY HEAD AND NECK WITH AND WITHOUT CONTRAST TECHNIQUE: Multidetector CT imaging of the head and neck was performed using the standard protocol during bolus administration of intravenous contrast. Multiplanar CT image reconstructions and MIPs were obtained to evaluate the vascular anatomy. Carotid stenosis measurements (when applicable) are obtained utilizing NASCET criteria, using the distal internal carotid diameter as the denominator. RADIATION DOSE REDUCTION: This exam was performed according to the departmental dose-optimization program which includes automated exposure control, adjustment of the mA and/or kV according to patient size and/or use of iterative reconstruction technique. CONTRAST:  75mL OMNIPAQUE IOHEXOL 350 MG/ML SOLN COMPARISON:  None Available. FINDINGS: CT HEAD FINDINGS Brain: There is no mass, hemorrhage or extra-axial collection. The size and configuration of the ventricles and extra-axial CSF spaces are normal. There is no acute or chronic infarction. The brain parenchyma is normal. Skull: The visualized skull base, calvarium and extracranial soft tissues are normal. Sinuses/Orbits: No fluid levels or advanced mucosal thickening of the visualized paranasal sinuses. No mastoid or middle ear effusion. The orbits are normal. CTA NECK FINDINGS SKELETON: There is no bony spinal canal stenosis. No lytic or blastic lesion. OTHER NECK: Normal pharynx, larynx and major salivary glands. No cervical lymphadenopathy. Unremarkable thyroid gland. UPPER CHEST: No pneumothorax or pleural effusion. No nodules or masses. AORTIC ARCH: There is no calcific atherosclerosis of the aortic arch. There is no aneurysm, dissection or hemodynamically significant stenosis of the visualized portion of the aorta. Conventional 3 vessel aortic branching pattern. The visualized proximal subclavian arteries are widely patent. RIGHT CAROTID  SYSTEM: No dissection, occlusion or aneurysm. Mild atherosclerotic calcification at the carotid bifurcation without hemodynamically significant stenosis. LEFT CAROTID SYSTEM: No dissection, occlusion or aneurysm. Mild atherosclerotic calcification at the carotid bifurcation without hemodynamically significant stenosis. VERTEBRAL ARTERIES: Left dominant configuration. There are streak artifacts obscuring much of the V2 segments. No visible opacification of the right V3 segment, which may be occluded. CTA HEAD FINDINGS POSTERIOR CIRCULATION: --Vertebral arteries: Occluded right V4 segment. Multifocal atherosclerotic calcification of the left V4 segment with severe stenosis. --Inferior cerebellar arteries: Patent --Basilar artery: Multifocal severe stenosis --Superior cerebellar arteries: Normal. --Posterior cerebral arteries (PCA): Predominantly fetal origin of the right PCA. There is severe stenosis of the proximal right P2 segment.  Normal left PCA origin. Multifocal atherosclerotic irregularity. ANTERIOR CIRCULATION: --Intracranial internal carotid arteries: Atherosclerotic calcification bilaterally with severe stenosis of the supraclinoid segments. --Anterior cerebral arteries (ACA): Normal. Both A1 segments are present. Patent anterior communicating artery (a-comm). --Middle cerebral arteries (MCA): Multifocal mild atherosclerotic irregularity without significant stenosis. VENOUS SINUSES: As permitted by contrast timing, patent. Review of the MIP images confirms the above findings. IMPRESSION: 1. 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 Severe stenosis of the V4 segment of the left vertebral artery. 2. 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 3. Severe stenosis of the supraclinoid internal carotid arteries. 4. Mild bilateral carotid bifurcation atherosclerosis without hemodynamically significant stenosis. Electronically Signed   By: Deatra Robinson M.D.   On:  11/12/2022 20:01   DG Thoracic Spine 2 View  Result Date: 11/12/2022 CLINICAL DATA:  T7-T12 posterior fusion EXAM: THORACIC SPINE 2 VIEWS COMPARISON:  11/12/2022 FLUOROSCOPY: Exposure Index (as provided by the fluoroscopic device): 160.2 mGy FINDINGS: Intraoperative fluoroscopic and CT assistance was given during the performance of the procedure, and images are provided for interpretation only. Images demonstrate placement of posterior fusion rod and intra corporal screws spanning T7 through T12. Alignment is anatomic. Please refer to the operative report. IMPRESSION: 1. Intraoperative evaluation as above. Please refer to the operative report. Electronically Signed   By: Sharlet Salina M.D.   On: 11/12/2022 11:19   DG C-Arm 1-60 Min-No Report  Result Date: 11/12/2022 Fluoroscopy was utilized by the requesting physician.  No radiographic interpretation.   DG C-Arm 1-60 Min-No Report  Result Date: 11/12/2022 Fluoroscopy was utilized by the requesting physician.  No radiographic interpretation.   MR THORACIC SPINE WO CONTRAST  Result Date: 11/12/2022 CLINICAL DATA:  Trauma EXAM: MRI THORACIC SPINE WITHOUT CONTRAST TECHNIQUE: Multiplanar, multisequence MR imaging of the thoracic spine was performed. No intravenous contrast was administered. COMPARISON:  CT thoracic spine 11/11/2022 FINDINGS: Alignment:  Normal Vertebrae: There is diffuse ankylosis of the thoracic spine. There is a fracture that traverses the anterior T9-10 disc space and extends through the inferior endplate and posterior wall of T9. The fracture extends through the bilateral posterior elements. The anterior longitudinal ligament is torn. Incidental T11 hemangioma. Cord: Normal signal. Severe mass effect on the spinal cord at the T6-8 levels. Paraspinal and other soft tissues: Small prevertebral effusion at the T8-12 levels. Disc levels: There is dorsal epidural lipomatosis throughout the thoracic spine. There is right dorsal spinal  canal ossification at the T5-7 levels as shown on the earlier CT. There is severe attenuation of the thecal sac at T6-7 and T7-8 with marked mass effect on the spinal cord due to combination of central disc protrusions and epidural lipomatosis. Moderate thecal sac attenuation at T8-9. IMPRESSION: 1. Diffuse ankylosis of the thoracic spine with fracture through the anterior T9-10 disc space and extending through the inferior endplate, posterior wall and bilateral posterior elements of T9. 2. 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 3. Severe attenuation of the thecal sac at T6-7 and T7-8 with severe mass effect on the spinal cord due to combination of central disc protrusions and epidural lipomatosis. Moderate thecal sac attenuation at T8-9. Electronically Signed   By: Deatra Robinson M.D.   On: 11/12/2022 01:46   DG Ankle Complete Left  Result Date: 11/11/2022 CLINICAL DATA:  Status post trauma. EXAM: LEFT ANKLE COMPLETE - 3+ VIEW COMPARISON:  None Available. FINDINGS: There is no evidence of an acute fracture, dislocation,  or joint effusion. A chronic fracture deformity is seen involving the left medial malleolus. Mild lateral soft tissue swelling is noted. Mild to moderate severity calcification of the plantar fascia of the proximal left foot is also seen. IMPRESSION: Mild lateral soft tissue swelling without evidence of acute fracture or dislocation. Electronically Signed   By: Aram Candela M.D.   On: 11/11/2022 23:13   DG Knee Complete 4 Views Right  Result Date: 11/11/2022 CLINICAL DATA:  Status post trauma. EXAM: RIGHT KNEE - COMPLETE 4+ VIEW COMPARISON:  None Available. FINDINGS: No evidence of an acute fracture or dislocation. Marked severity patellofemoral, medial tibiofemoral and lateral tibiofemoral compartment space narrowing is seen. Medial and lateral marginal osteophytes are also noted. There is a small to moderate sized joint effusion. IMPRESSION: 1.  Marked severity tricompartmental osteoarthritis. 2. Small to moderate sized joint effusion. Electronically Signed   By: Aram Candela M.D.   On: 11/11/2022 23:12   CT CHEST ABDOMEN PELVIS W CONTRAST  Result Date: 11/11/2022 CLINICAL DATA:  Lump poly trauma. Crush injury. EXAM: CT CHEST, ABDOMEN, AND PELVIS WITH CONTRAST TECHNIQUE: Multidetector CT imaging of the chest, abdomen and pelvis was performed following the standard protocol during bolus administration of intravenous contrast. RADIATION DOSE REDUCTION: This exam was performed according to the departmental dose-optimization program which includes automated exposure control, adjustment of the mA and/or kV according to patient size and/or use of iterative reconstruction technique. CONTRAST:  OMNIPAQUE IOHEXOL 300 MG/ML  SOLN COMPARISON:  Chest radiograph earlier today. FINDINGS: CT CHEST FINDINGS Cardiovascular: No evidence of acute aortic or vascular injury. Aortic atherosclerosis. Normal heart size. No central pulmonary embolus. No pericardial effusion. Mediastinum/Nodes: No mediastinal hemorrhage or hematoma. No pneumomediastinum. No esophageal wall thickening. No adenopathy. Lungs/Pleura: No pneumothorax. There is a trace right pleural effusion. No evidence of pulmonary contusion or focal airspace disease. The trachea and central airways are patent. Musculoskeletal: Thoracic spine injury is assessed on concurrent thoracic spine reformats, reported separately. No acute fracture of the ribs, included clavicles or shoulder girdles or sternum. There is no confluent chest wall soft tissue contusion. CT ABDOMEN PELVIS FINDINGS Hepatobiliary: No hepatic injury or perihepatic hematoma. Diffuse hepatic steatosis. The liver is enlarged spanning 23.6 cm cranial caudal. Gallbladder is unremarkable. Pancreas: No evidence of injury. No ductal dilatation or inflammation. Spleen: No splenic injury or perisplenic hematoma. Splenomegaly spleen spanning at least  13.9 cm cranial caudal. Adrenals/Urinary Tract: No adrenal hemorrhage or renal injury identified. Small cyst in the right kidney needs no further imaging follow-up. No hydronephrosis. Bladder is unremarkable. Stomach/Bowel: There is no evidence of bowel injury or mesenteric hematoma. No bowel wall thickening or inflammation. Prior appendectomy. Vascular/Lymphatic: Aortic atherosclerosis. No evidence of vascular injury. No retroperitoneal fluid. Patent portal vein. No suspicious adenopathy. Reproductive: Presumed prostatectomy. Other: No free air or free fluid. Fat in the left inguinal canal. There is no confluent body wall contusion. Musculoskeletal: Lumbar spine assessed on concurrent lumbar spine reformats, reported separately. Partial fusion of the sacroiliac joints. No pelvic fracture. IMPRESSION: 1. No evidence of acute traumatic injury to the chest, abdomen, or pelvis. Please reference concurrent thoracic spine reformats for thoracic spine injury assessment. 2. Trace right pleural effusion. 3. Hepatosplenomegaly and hepatic steatosis. Aortic Atherosclerosis (ICD10-I70.0). Electronically Signed   By: Narda Rutherford M.D.   On: 11/11/2022 22:48   CT T-SPINE NO CHARGE  Result Date: 11/11/2022 CLINICAL DATA:  Trauma, crush injury. EXAM: CT THORACIC SPINE WITHOUT CONTRAST TECHNIQUE: Multidetector CT images of the thoracic were obtained  using the standard protocol without intravenous contrast. RADIATION DOSE REDUCTION: This exam was performed according to the departmental dose-optimization program which includes automated exposure control, adjustment of the mA and/or kV according to patient size and/or use of iterative reconstruction technique. COMPARISON:  Thoracic spine radiograph 11/10/2020 FINDINGS: Alignment: There is widening of the anterior aspect of T9-T10 disc space. Vertebrae: There is diffuse thoracic ankylosis flowing anterior osteophytes/syndesmophytes. Distraction injury at T9. There is an acute  fracture through the posteroinferior aspect of T9 vertebral body extending to the T9-T10 disc space and into the posterior elements bilaterally. There is also a transverse process fracture on the right at T10. Possible small fracture through the T9 spinous process posteriorly. Incidental vertebral body hemangioma at T11. Paraspinal and other soft tissues: Anterior paraspinal soft tissue thickening at the level of distraction injury. Fully assessed on concurrent chest CT, reported separately. Disc levels: Diffuse flowing anterior osteophytes and syndesmophytes with thoracic ankylosis. There is posterior ossification possibly related to facet changes at the level of T6 and T7 that cause spinal canal stenosis. IMPRESSION: 1. Distraction injury at T9 with acute fracture through the posteroinferior aspect of T9 vertebral body extending to the T9-T10 disc space and into the posterior elements bilaterally. There is also a transverse process fracture on the right at T10. Possible small fracture through the T9 spinous process posteriorly. Consider MRI for more detailed assessment. 2. Diffuse thoracic ankylosis with flowing anterior osteophytes/syndesmophytes. 3. Posterior ossification possibly related to facet changes at the level of T6 and T7 that cause spinal canal stenosis. Electronically Signed   By: Narda Rutherford M.D.   On: 11/11/2022 22:41   CT Cervical Spine Wo Contrast  Result Date: 11/11/2022 CLINICAL DATA:  Crush injury. Lawnmower rolled backwards on landed on his chest. EXAM: CT HEAD WITHOUT CONTRAST CT CERVICAL SPINE WITHOUT CONTRAST TECHNIQUE: Multidetector CT imaging of the head and cervical spine was performed following the standard protocol without intravenous contrast. Multiplanar CT image reconstructions of the cervical spine were also generated. RADIATION DOSE REDUCTION: This exam was performed according to the departmental dose-optimization program which includes automated exposure control,  adjustment of the mA and/or kV according to patient size and/or use of iterative reconstruction technique. COMPARISON:  PET/CT 12/13/2019 and MRI cervical spine 12/10/2020 FINDINGS: CT HEAD FINDINGS Brain: No intracranial hemorrhage, mass effect, or evidence of acute infarct. No hydrocephalus. No extra-axial fluid collection. Vascular: No hyperdense vessel. Intracranial arterial calcification. Skull: No fracture or focal lesion. Sinuses/Orbits: No acute finding. Paranasal sinuses and mastoid air cells are well aerated. Other: None. CT CERVICAL SPINE FINDINGS Alignment: No evidence of traumatic malalignment. Skull base and vertebrae: No acute fracture. No primary bone lesion or focal pathologic process. Soft tissues and spinal canal: No prevertebral fluid or swelling. No visible canal hematoma. Disc levels: Posterior fusion C3-C4. Multilevel advanced spondylosis, displaced height loss, degenerative endplate changes. Fusion of anterior osteophytes. 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 This causes moderate spinal canal narrowing. Uncovertebral spurring and facet arthropathy cause multilevel moderate neural foraminal narrowing. Upper chest: No acute abnormality. Other: Carotid calcification IMPRESSION: 1. No acute intracranial abnormality. 2. No acute fracture in the cervical spine. Multilevel degenerative spondylosis. Electronically Signed   By: Minerva Fester M.D.   On: 11/11/2022 22:36   CT Head Wo Contrast  Result Date: 11/11/2022 CLINICAL DATA:  Crush injury. Lawnmower rolled backwards on landed on his chest. EXAM: CT HEAD WITHOUT CONTRAST CT CERVICAL SPINE WITHOUT CONTRAST TECHNIQUE: Multidetector CT imaging of the head and cervical  spine was performed following the standard protocol without intravenous contrast. Multiplanar CT image reconstructions of the cervical spine were also generated. RADIATION DOSE REDUCTION: This exam was performed according to the departmental  dose-optimization program which includes automated exposure control, adjustment of the mA and/or kV according to patient size and/or use of iterative reconstruction technique. COMPARISON:  PET/CT 12/13/2019 and MRI cervical spine 12/10/2020 FINDINGS: CT HEAD FINDINGS Brain: No intracranial hemorrhage, mass effect, or evidence of acute infarct. No hydrocephalus. No extra-axial fluid collection. Vascular: No hyperdense vessel. Intracranial arterial calcification. Skull: No fracture or focal lesion. Sinuses/Orbits: No acute finding. Paranasal sinuses and mastoid air cells are well aerated. Other: None. CT CERVICAL SPINE FINDINGS Alignment: No evidence of traumatic malalignment. Skull base and vertebrae: No acute fracture. No primary bone lesion or focal pathologic process. Soft tissues and spinal canal: No prevertebral fluid or swelling. No visible canal hematoma. Disc levels: Posterior fusion C3-C4. Multilevel advanced spondylosis, displaced height loss, degenerative endplate changes. Fusion of anterior osteophytes. 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 This causes moderate spinal canal narrowing. Uncovertebral spurring and facet arthropathy cause multilevel moderate neural foraminal narrowing. Upper chest: No acute abnormality. Other: Carotid calcification IMPRESSION: 1. No acute intracranial abnormality. 2. No acute fracture in the cervical spine. Multilevel degenerative spondylosis. Electronically Signed   By: Minerva Fester M.D.   On: 11/11/2022 22:36   CT L-SPINE NO CHARGE  Result Date: 11/11/2022 CLINICAL DATA:  Crush injury, lawnmower accident. EXAM: CT LUMBAR SPINE WITHOUT CONTRAST TECHNIQUE: Multidetector CT imaging of the lumbar spine was performed without intravenous contrast administration. Multiplanar CT image reconstructions were also generated. RADIATION DOSE REDUCTION: This exam was performed according to the departmental dose-optimization program which includes automated  exposure control, adjustment of the mA and/or kV according to patient size and/or use of iterative reconstruction technique. COMPARISON:  Lumbar radiograph 11/10/2020 FINDINGS: Segmentation: 5 lumbar type vertebrae. Alignment: Normal. Vertebrae: No acute fracture. Vertebral body heights are normal. The posterior elements are intact. Flowing anterior osteophytes throughout the lumbar spine. Paraspinal and other soft tissues: Assessed on concurrent abdominopelvic CT, reported separately. No lumbar paraspinal hematoma. Disc levels: Diffuse flowing anterior osteophytes and syndesmophytes. The disc spaces are relatively well preserved. There is mild posterior spurring at L5-S1. Moderate diffuse facet hypertrophy. Spinal canal stenosis at L2-L3, L3-L4, and L4-L5. prior right L5 laminectomy. IMPRESSION: 1. No acute fracture or subluxation of the lumbar spine. 2. Flowing anterior osteophytes and syndesmophytes throughout the lumbar spine may represent ankylosis or diffuse idiopathic skeletal hyperostosis. 3. Spinal canal stenosis at L2-L3, L3-L4 and L4-L5. Electronically Signed   By: Narda Rutherford M.D.   On: 11/11/2022 22:32   DG Chest Port 1 View  Result Date: 11/11/2022 CLINICAL DATA:  Status post trauma. EXAM: PORTABLE CHEST 1 VIEW COMPARISON:  December 27, 2007 FINDINGS: The heart size and mediastinal contours are within normal limits. There is marked severity calcification of the aortic arch. Low lung volumes are noted. Both lungs are clear. Multilevel degenerative changes seen throughout the thoracic spine. IMPRESSION: Low lung volumes without active cardiopulmonary disease. Electronically Signed   By: Aram Candela M.D.   On: 11/11/2022 22:13   DG Elbow 2 Views Left  Result Date: 11/11/2022 CLINICAL DATA:  Status post trauma. EXAM: LEFT ELBOW - 2 VIEW COMPARISON:  None Available. FINDINGS: A small linear lucency of indeterminate age is seen involving the lateral aspect of the left radial head. There is no  evidence of dislocation chronic and degenerative changes are seen involving  the medial epicondyle of the distal left humerus and volar aspect of the proximal left radius. Mild to moderate severity bony spurring is seen involving the left olecranon process. Soft tissues are unremarkable. IMPRESSION: Findings which may represent a small left radial head fracture of indeterminate age. Correlation with physical examination is recommended to determine the presence of point tenderness. Additional CT evaluation is recommended if acute fracture remains of clinical concern. Electronically Signed   By: Aram Candela M.D.   On: 11/11/2022 22:12   (Echo, Carotid, EGD, Colonoscopy, ERCP)    Subjective: Patient seen and examined.  Friend at the bedside.  He had 1 bowel movement yesterday.  Belly is slightly distended but less bloated than yesterday.  Denies any nausea or vomiting.  He does not have good appetite.  Passing flatus.  Still worried about unable to get out of the bed.  He thinks physical therapy should work very gently with him.  Catheter with clear urine.   Discharge Exam: Vitals:   11/17/22 2238 11/18/22 0836  BP: 134/61 (!) 149/71  Pulse: 83 84  Resp: 17 18  Temp: 98.4 F (36.9 C) 99.7 F (37.6 C)  SpO2: 98% 97%   Vitals:   11/17/22 0003 11/17/22 0749 11/17/22 2238 11/18/22 0836  BP: 136/77 129/63 134/61 (!) 149/71  Pulse: 86 74 83 84  Resp: 20 18 17 18   Temp: 98 F (36.7 C) 98.3 F (36.8 C) 98.4 F (36.9 C) 99.7 F (37.6 C)  TempSrc:  Oral Oral   SpO2: 96% 96% 98% 97%  Weight:      Height:        General: Pt is alert, awake, not in acute distress at rest.  Appropriately anxious. Cardiovascular: RRR, S1/S2 +, no rubs, no gallops Respiratory: CTA bilaterally, no wheezing, no rhonchi Abdominal: Soft, distended but nontender.  Bowel sound present.   Extremities: no edema, no cyanosis  Alert awake and oriented. Right LE with power 4/5 Left LE with power 2-3/5    The  results of significant diagnostics from this hospitalization (including imaging, microbiology, ancillary and laboratory) are listed below for reference.     Microbiology: No results found for this or any previous visit (from the past 240 hour(s)).   Labs: BNP (last 3 results) No results for input(s): "BNP" in the last 8760 hours. Basic Metabolic Panel: Recent Labs  Lab 11/11/22 2152 11/12/22 0424 11/14/22 0514 11/17/22 0449 11/17/22 0929  NA 134* 134* 132* 131*  --   K 4.2 3.8 3.7 3.6  --   CL 96* 100 100 100  --   CO2 22 23 25 22   --   GLUCOSE 273* 169* 188* 184*  --   BUN 30* 35* 17 22  --   CREATININE 1.41* 1.42* 1.01 1.00  --   CALCIUM 10.3 9.3 8.7* 8.6*  --   MG  --   --   --   --  2.0  PHOS  --   --   --   --  3.2   Liver Function Tests: Recent Labs  Lab 11/11/22 2152 11/12/22 0424  AST 50* 39  ALT 57* 44  ALKPHOS 87 73  BILITOT 1.1 0.9  PROT 8.6* 7.1  ALBUMIN 4.9 4.2   No results for input(s): "LIPASE", "AMYLASE" in the last 168 hours. No results for input(s): "AMMONIA" in the last 168 hours. CBC: Recent Labs  Lab 11/11/22 2152 11/12/22 0424 11/14/22 0514 11/15/22 0420 11/17/22 0449  WBC 11.9* 7.1 8.1 7.6  7.1  NEUTROABS 8.8*  --   --   --   --   HGB 15.9 13.9 11.8* 11.3* 11.7*  HCT 46.5 40.4 34.4* 33.1* 33.1*  MCV 87.6 87.3 89.1 88.0 86.9  PLT 202 161 154 150 202   Cardiac Enzymes: No results for input(s): "CKTOTAL", "CKMB", "CKMBINDEX", "TROPONINI" in the last 168 hours. BNP: Invalid input(s): "POCBNP" CBG: Recent Labs  Lab 11/17/22 2004 11/18/22 0027 11/18/22 0401 11/18/22 0814 11/18/22 1125  GLUCAP 159* 181* 168* 160* 172*   D-Dimer No results for input(s): "DDIMER" in the last 72 hours. Hgb A1c No results for input(s): "HGBA1C" in the last 72 hours. Lipid Profile No results for input(s): "CHOL", "HDL", "LDLCALC", "TRIG", "CHOLHDL", "LDLDIRECT" in the last 72 hours. Thyroid function studies No results for input(s): "TSH",  "T4TOTAL", "T3FREE", "THYROIDAB" in the last 72 hours.  Invalid input(s): "FREET3" Anemia work up No results for input(s): "VITAMINB12", "FOLATE", "FERRITIN", "TIBC", "IRON", "RETICCTPCT" in the last 72 hours. Urinalysis    Component Value Date/Time   COLORURINE YELLOW 06/10/2008 0902   APPEARANCEUR CLEAR 06/10/2008 0902   LABSPEC 1.018 06/10/2008 0902   PHURINE 5.5 06/10/2008 0902   GLUCOSEU NEGATIVE 06/10/2008 0902   HGBUR NEGATIVE 06/10/2008 0902   BILIRUBINUR NEGATIVE 06/10/2008 0902   KETONESUR NEGATIVE 06/10/2008 0902   PROTEINUR NEGATIVE 06/10/2008 0902   UROBILINOGEN 0.2 06/10/2008 0902   NITRITE NEGATIVE 06/10/2008 0902   LEUKOCYTESUR  06/10/2008 0902    NEGATIVE MICROSCOPIC NOT DONE ON URINES WITH NEGATIVE PROTEIN, BLOOD, LEUKOCYTES, NITRITE, OR GLUCOSE <1000 mg/dL.   Sepsis Labs Recent Labs  Lab 11/12/22 0424 11/14/22 0514 11/15/22 0420 11/17/22 0449  WBC 7.1 8.1 7.6 7.1   Microbiology No results found for this or any previous visit (from the past 240 hour(s)).   Time coordinating discharge:  35 minutes  SIGNED:   Dorcas Carrow, MD  [facility] 11/18/2022, 1:58 PM

## 2022-11-19 ENCOUNTER — Ambulatory Visit: Payer: Medicare HMO | Admitting: Physician Assistant

## 2022-11-22 DIAGNOSIS — Z7984 Long term (current) use of oral hypoglycemic drugs: Secondary | ICD-10-CM | POA: Diagnosis not present

## 2022-11-22 DIAGNOSIS — I10 Essential (primary) hypertension: Secondary | ICD-10-CM | POA: Diagnosis not present

## 2022-11-22 DIAGNOSIS — Z794 Long term (current) use of insulin: Secondary | ICD-10-CM | POA: Diagnosis not present

## 2022-11-22 DIAGNOSIS — S22078D Other fracture of T9-T10 vertebra, subsequent encounter for fracture with routine healing: Secondary | ICD-10-CM | POA: Diagnosis not present

## 2022-11-22 DIAGNOSIS — W19XXXD Unspecified fall, subsequent encounter: Secondary | ICD-10-CM | POA: Diagnosis not present

## 2022-11-22 DIAGNOSIS — N320 Bladder-neck obstruction: Secondary | ICD-10-CM | POA: Diagnosis not present

## 2022-11-22 DIAGNOSIS — E871 Hypo-osmolality and hyponatremia: Secondary | ICD-10-CM | POA: Diagnosis not present

## 2022-11-22 DIAGNOSIS — E1142 Type 2 diabetes mellitus with diabetic polyneuropathy: Secondary | ICD-10-CM | POA: Diagnosis not present

## 2022-11-22 DIAGNOSIS — G4733 Obstructive sleep apnea (adult) (pediatric): Secondary | ICD-10-CM | POA: Diagnosis not present

## 2022-11-23 ENCOUNTER — Ambulatory Visit: Payer: Medicare HMO | Admitting: Physician Assistant

## 2022-11-23 ENCOUNTER — Encounter: Payer: Self-pay | Admitting: Physician Assistant

## 2022-11-23 VITALS — BP 98/62 | HR 78 | Ht 68.0 in | Wt 267.0 lb

## 2022-11-23 DIAGNOSIS — K567 Ileus, unspecified: Secondary | ICD-10-CM | POA: Diagnosis not present

## 2022-11-23 DIAGNOSIS — N320 Bladder-neck obstruction: Secondary | ICD-10-CM

## 2022-11-23 DIAGNOSIS — D62 Acute posthemorrhagic anemia: Secondary | ICD-10-CM | POA: Diagnosis not present

## 2022-11-23 DIAGNOSIS — G4733 Obstructive sleep apnea (adult) (pediatric): Secondary | ICD-10-CM | POA: Diagnosis not present

## 2022-11-23 DIAGNOSIS — Z09 Encounter for follow-up examination after completed treatment for conditions other than malignant neoplasm: Secondary | ICD-10-CM | POA: Diagnosis not present

## 2022-11-23 DIAGNOSIS — G629 Polyneuropathy, unspecified: Secondary | ICD-10-CM | POA: Diagnosis not present

## 2022-11-23 DIAGNOSIS — S22078D Other fracture of T9-T10 vertebra, subsequent encounter for fracture with routine healing: Secondary | ICD-10-CM | POA: Diagnosis not present

## 2022-11-23 DIAGNOSIS — E1142 Type 2 diabetes mellitus with diabetic polyneuropathy: Secondary | ICD-10-CM | POA: Diagnosis not present

## 2022-11-23 DIAGNOSIS — E871 Hypo-osmolality and hyponatremia: Secondary | ICD-10-CM | POA: Diagnosis not present

## 2022-11-23 DIAGNOSIS — I10 Essential (primary) hypertension: Secondary | ICD-10-CM | POA: Diagnosis not present

## 2022-11-23 DIAGNOSIS — Z87448 Personal history of other diseases of urinary system: Secondary | ICD-10-CM

## 2022-11-23 NOTE — Progress Notes (Signed)
Patient presented to clinic today for scheduled Foley removal after undergoing bladder neck contracture dilation and complex Foley catheter placement with Dr. Richardo Hanks on 11/12/2022 at the time of neurosurgery with Dr. Myer Haff.  He has been discharged to [facility] and is accompanied today by his wife, who contributes to HPI.  He reports some discomfort from the Foley catheter, however he is still not able to use his left leg so he is minimally ambulatory.  He continues PT.  He thinks the Foley catheter has helped with urinary management and is concerned about his ability to use a urinal if his Foley is removed.  Discussed various options including Foley catheter removal versus continuing Foley catheter with plans for removal in several weeks once he has completed additional physical therapy.  He is tentatively scheduled for discharge back home on 12/04/2022.  Ultimately, he elected to keep his Foley catheter in place until after his return home.  Will schedule him for follow-up with [place] in early June for Foley removal versus consideration of continued catheterization depending on his mobility.  I did place a StatLock on his left leg today and remove his catheter strap, and he noted increased comfort with his catheter with this.  Carman Ching, PA-C 11/23/22 4:16 PM  I spent 15 minutes on the day of the encounter to include pre-visit record review, face-to-face time with the patient, and post-visit ordering of tests.

## 2022-11-24 DIAGNOSIS — Z7984 Long term (current) use of oral hypoglycemic drugs: Secondary | ICD-10-CM | POA: Diagnosis not present

## 2022-11-24 DIAGNOSIS — E871 Hypo-osmolality and hyponatremia: Secondary | ICD-10-CM | POA: Diagnosis not present

## 2022-11-24 DIAGNOSIS — D62 Acute posthemorrhagic anemia: Secondary | ICD-10-CM | POA: Diagnosis not present

## 2022-11-24 DIAGNOSIS — I10 Essential (primary) hypertension: Secondary | ICD-10-CM | POA: Diagnosis not present

## 2022-11-24 DIAGNOSIS — Z794 Long term (current) use of insulin: Secondary | ICD-10-CM | POA: Diagnosis not present

## 2022-11-24 DIAGNOSIS — N320 Bladder-neck obstruction: Secondary | ICD-10-CM | POA: Diagnosis not present

## 2022-11-24 DIAGNOSIS — E1142 Type 2 diabetes mellitus with diabetic polyneuropathy: Secondary | ICD-10-CM | POA: Diagnosis not present

## 2022-11-24 DIAGNOSIS — S22078D Other fracture of T9-T10 vertebra, subsequent encounter for fracture with routine healing: Secondary | ICD-10-CM | POA: Diagnosis not present

## 2022-11-24 DIAGNOSIS — K567 Ileus, unspecified: Secondary | ICD-10-CM | POA: Diagnosis not present

## 2022-11-24 NOTE — Progress Notes (Signed)
   REFERRING PHYSICIAN:  Marisue Ivan, Md [address] [place],  [place] [address]  DOS: 11/12/22  Open reduction internal fixation of T9 fracture  HISTORY OF PRESENT ILLNESS: Alexander Wilcox is approximately 2 weeks status post ORIF of T9 fracture. Was given robaxin and oxycodone 10mg  on discharge from the hospital.   He was discharged to SNF with a foley- had follow up with urology on 11/23/22 and they elected to keep his foley until June.   He has minimal back pain. He notes some pain in his ribs. No pain in left leg, but he still has weakness. He thinks it is improving, but he still cannot walk.    PHYSICAL EXAMINATION:  General: Patient is well developed, well nourished, calm, collected, and in no apparent distress.   NEUROLOGICAL:  General: In no acute distress.   Awake, alert, oriented to person, place, and time.  Pupils equal round and reactive to light.  Facial tone is symmetric.     Strength:            Side Iliopsoas Quads Hamstring PF DF EHL  R 5 5 5 5 5 5   L 3 4 4 4 4 3    Incision c/d/i   ROS (Neurologic):  Negative except as noted above  IMAGING: Nothing new to review.   ASSESSMENT/PLAN:  Alexander Wilcox is doing reasonably well s/p above surgery. Treatment options reviewed with patient and following plan made:   - I have advised the patient to lift up to 10 pounds until 6 weeks after surgery (follow up with Dr. Myer Haff).  - Reviewed wound care. Call if any concerns. Okay to get wet/soapy in shower. Do not submerge.  - No bending, twisting, or lifting.  - Continue on current medications including prn oxycodone. Agree with going down to oxycodone 5mg  q 6 hours prn severe pain.  - No muscle relaxer on his med list from SNF. If he is taking one, may consider holding it to help with sleepiness.  - Follow up as scheduled in 4 weeks and prn. Will need xrays prior to this visit. He needs to go to [facility] for these xrays 1 and 1/2  hours prior to his appointment.   Will update Dr. Myer Haff on his progress and message his wife with any further recommendations.   Advised to contact the office if any questions or concerns arise.  Drake Leach PA-C Department of neurosurgery

## 2022-11-26 ENCOUNTER — Ambulatory Visit (INDEPENDENT_AMBULATORY_CARE_PROVIDER_SITE_OTHER): Payer: Medicare HMO | Admitting: Orthopedic Surgery

## 2022-11-26 ENCOUNTER — Encounter: Payer: Self-pay | Admitting: Orthopedic Surgery

## 2022-11-26 VITALS — BP 124/68 | Ht 68.0 in | Wt 267.0 lb

## 2022-11-26 DIAGNOSIS — I10 Essential (primary) hypertension: Secondary | ICD-10-CM | POA: Diagnosis not present

## 2022-11-26 DIAGNOSIS — E871 Hypo-osmolality and hyponatremia: Secondary | ICD-10-CM | POA: Diagnosis not present

## 2022-11-26 DIAGNOSIS — Z981 Arthrodesis status: Secondary | ICD-10-CM

## 2022-11-26 DIAGNOSIS — S22078D Other fracture of T9-T10 vertebra, subsequent encounter for fracture with routine healing: Secondary | ICD-10-CM | POA: Diagnosis not present

## 2022-11-26 DIAGNOSIS — M4815 Ankylosing hyperostosis [Forestier], thoracolumbar region: Secondary | ICD-10-CM

## 2022-11-26 DIAGNOSIS — N320 Bladder-neck obstruction: Secondary | ICD-10-CM | POA: Diagnosis not present

## 2022-11-26 DIAGNOSIS — D62 Acute posthemorrhagic anemia: Secondary | ICD-10-CM | POA: Diagnosis not present

## 2022-11-26 DIAGNOSIS — S22079S Unspecified fracture of T9-T10 vertebra, sequela: Secondary | ICD-10-CM

## 2022-11-26 DIAGNOSIS — W19XXXD Unspecified fall, subsequent encounter: Secondary | ICD-10-CM

## 2022-11-26 DIAGNOSIS — K567 Ileus, unspecified: Secondary | ICD-10-CM | POA: Diagnosis not present

## 2022-11-26 DIAGNOSIS — M532X4 Spinal instabilities, thoracic region: Secondary | ICD-10-CM

## 2022-11-26 DIAGNOSIS — S22079D Unspecified fracture of T9-T10 vertebra, subsequent encounter for fracture with routine healing: Secondary | ICD-10-CM

## 2022-11-30 ENCOUNTER — Encounter: Payer: Self-pay | Admitting: Neurosurgery

## 2022-11-30 ENCOUNTER — Encounter: Payer: Self-pay | Admitting: Orthopedic Surgery

## 2022-12-01 ENCOUNTER — Inpatient Hospital Stay
Admission: EM | Admit: 2022-12-01 | Discharge: 2022-12-07 | DRG: 698 | Disposition: A | Discharge status: Skilled Nursing Facility | Payer: Medicare HMO | Source: Skilled Nursing Facility | Attending: Internal Medicine | Admitting: Internal Medicine

## 2022-12-01 ENCOUNTER — Emergency Department: Payer: Medicare HMO

## 2022-12-01 ENCOUNTER — Other Ambulatory Visit: Payer: Self-pay

## 2022-12-01 DIAGNOSIS — R809 Proteinuria, unspecified: Secondary | ICD-10-CM | POA: Diagnosis not present

## 2022-12-01 DIAGNOSIS — N3091 Cystitis, unspecified with hematuria: Secondary | ICD-10-CM | POA: Diagnosis present

## 2022-12-01 DIAGNOSIS — I2699 Other pulmonary embolism without acute cor pulmonale: Secondary | ICD-10-CM | POA: Diagnosis not present

## 2022-12-01 DIAGNOSIS — D62 Acute posthemorrhagic anemia: Secondary | ICD-10-CM | POA: Diagnosis not present

## 2022-12-01 DIAGNOSIS — R319 Hematuria, unspecified: Secondary | ICD-10-CM

## 2022-12-01 DIAGNOSIS — N3289 Other specified disorders of bladder: Secondary | ICD-10-CM | POA: Diagnosis present

## 2022-12-01 DIAGNOSIS — Y846 Urinary catheterization as the cause of abnormal reaction of the patient, or of later complication, without mention of misadventure at the time of the procedure: Secondary | ICD-10-CM | POA: Diagnosis present

## 2022-12-01 DIAGNOSIS — R310 Gross hematuria: Secondary | ICD-10-CM | POA: Diagnosis not present

## 2022-12-01 DIAGNOSIS — I6501 Occlusion and stenosis of right vertebral artery: Secondary | ICD-10-CM | POA: Diagnosis not present

## 2022-12-01 DIAGNOSIS — E871 Hypo-osmolality and hyponatremia: Secondary | ICD-10-CM | POA: Diagnosis present

## 2022-12-01 DIAGNOSIS — E1142 Type 2 diabetes mellitus with diabetic polyneuropathy: Secondary | ICD-10-CM | POA: Diagnosis present

## 2022-12-01 DIAGNOSIS — E1165 Type 2 diabetes mellitus with hyperglycemia: Secondary | ICD-10-CM | POA: Diagnosis not present

## 2022-12-01 DIAGNOSIS — I82442 Acute embolism and thrombosis of left tibial vein: Secondary | ICD-10-CM | POA: Diagnosis present

## 2022-12-01 DIAGNOSIS — X58XXXD Exposure to other specified factors, subsequent encounter: Secondary | ICD-10-CM | POA: Diagnosis present

## 2022-12-01 DIAGNOSIS — Z885 Allergy status to narcotic agent status: Secondary | ICD-10-CM

## 2022-12-01 DIAGNOSIS — N390 Urinary tract infection, site not specified: Secondary | ICD-10-CM | POA: Diagnosis not present

## 2022-12-01 DIAGNOSIS — T83518A Infection and inflammatory reaction due to other urinary catheter, initial encounter: Secondary | ICD-10-CM | POA: Diagnosis not present

## 2022-12-01 DIAGNOSIS — A419 Sepsis, unspecified organism: Secondary | ICD-10-CM | POA: Diagnosis present

## 2022-12-01 DIAGNOSIS — Z79899 Other long term (current) drug therapy: Secondary | ICD-10-CM | POA: Diagnosis not present

## 2022-12-01 DIAGNOSIS — E860 Dehydration: Secondary | ICD-10-CM | POA: Diagnosis present

## 2022-12-01 DIAGNOSIS — S22078D Other fracture of T9-T10 vertebra, subsequent encounter for fracture with routine healing: Secondary | ICD-10-CM | POA: Diagnosis not present

## 2022-12-01 DIAGNOSIS — I10 Essential (primary) hypertension: Secondary | ICD-10-CM | POA: Diagnosis present

## 2022-12-01 DIAGNOSIS — Z7984 Long term (current) use of oral hypoglycemic drugs: Secondary | ICD-10-CM | POA: Diagnosis not present

## 2022-12-01 DIAGNOSIS — D649 Anemia, unspecified: Secondary | ICD-10-CM | POA: Diagnosis not present

## 2022-12-01 DIAGNOSIS — R531 Weakness: Secondary | ICD-10-CM | POA: Insufficient documentation

## 2022-12-01 DIAGNOSIS — N320 Bladder-neck obstruction: Secondary | ICD-10-CM | POA: Diagnosis present

## 2022-12-01 DIAGNOSIS — E669 Obesity, unspecified: Secondary | ICD-10-CM | POA: Diagnosis present

## 2022-12-01 DIAGNOSIS — G9341 Metabolic encephalopathy: Secondary | ICD-10-CM

## 2022-12-01 DIAGNOSIS — B9620 Unspecified Escherichia coli [E. coli] as the cause of diseases classified elsewhere: Secondary | ICD-10-CM | POA: Diagnosis present

## 2022-12-01 DIAGNOSIS — Z7401 Bed confinement status: Secondary | ICD-10-CM | POA: Diagnosis not present

## 2022-12-01 DIAGNOSIS — R54 Age-related physical debility: Secondary | ICD-10-CM | POA: Diagnosis present

## 2022-12-01 DIAGNOSIS — R6520 Severe sepsis without septic shock: Secondary | ICD-10-CM | POA: Diagnosis present

## 2022-12-01 DIAGNOSIS — E785 Hyperlipidemia, unspecified: Secondary | ICD-10-CM | POA: Diagnosis present

## 2022-12-01 DIAGNOSIS — Z794 Long term (current) use of insulin: Secondary | ICD-10-CM

## 2022-12-01 DIAGNOSIS — Z8546 Personal history of malignant neoplasm of prostate: Secondary | ICD-10-CM

## 2022-12-01 DIAGNOSIS — W19XXXA Unspecified fall, initial encounter: Secondary | ICD-10-CM | POA: Diagnosis not present

## 2022-12-01 DIAGNOSIS — Z87891 Personal history of nicotine dependence: Secondary | ICD-10-CM

## 2022-12-01 DIAGNOSIS — Z8673 Personal history of transient ischemic attack (TIA), and cerebral infarction without residual deficits: Secondary | ICD-10-CM

## 2022-12-01 DIAGNOSIS — T83511A Infection and inflammatory reaction due to indwelling urethral catheter, initial encounter: Secondary | ICD-10-CM | POA: Diagnosis not present

## 2022-12-01 DIAGNOSIS — N400 Enlarged prostate without lower urinary tract symptoms: Secondary | ICD-10-CM | POA: Diagnosis present

## 2022-12-01 DIAGNOSIS — Z7982 Long term (current) use of aspirin: Secondary | ICD-10-CM

## 2022-12-01 DIAGNOSIS — G4733 Obstructive sleep apnea (adult) (pediatric): Secondary | ICD-10-CM | POA: Diagnosis present

## 2022-12-01 DIAGNOSIS — I700 Atherosclerosis of aorta: Secondary | ICD-10-CM | POA: Diagnosis not present

## 2022-12-01 DIAGNOSIS — Z9049 Acquired absence of other specified parts of digestive tract: Secondary | ICD-10-CM

## 2022-12-01 DIAGNOSIS — R4182 Altered mental status, unspecified: Secondary | ICD-10-CM

## 2022-12-01 DIAGNOSIS — Z6838 Body mass index (BMI) 38.0-38.9, adult: Secondary | ICD-10-CM

## 2022-12-01 DIAGNOSIS — R000 Tachycardia, unspecified: Secondary | ICD-10-CM | POA: Diagnosis not present

## 2022-12-01 DIAGNOSIS — S22079D Unspecified fracture of T9-T10 vertebra, subsequent encounter for fracture with routine healing: Secondary | ICD-10-CM

## 2022-12-01 DIAGNOSIS — M961 Postlaminectomy syndrome, not elsewhere classified: Secondary | ICD-10-CM | POA: Diagnosis present

## 2022-12-01 DIAGNOSIS — Z833 Family history of diabetes mellitus: Secondary | ICD-10-CM

## 2022-12-01 DIAGNOSIS — I82402 Acute embolism and thrombosis of unspecified deep veins of left lower extremity: Secondary | ICD-10-CM | POA: Diagnosis not present

## 2022-12-01 DIAGNOSIS — Z981 Arthrodesis status: Secondary | ICD-10-CM

## 2022-12-01 DIAGNOSIS — R739 Hyperglycemia, unspecified: Secondary | ICD-10-CM | POA: Diagnosis not present

## 2022-12-01 DIAGNOSIS — Z86711 Personal history of pulmonary embolism: Secondary | ICD-10-CM

## 2022-12-01 DIAGNOSIS — W19XXXD Unspecified fall, subsequent encounter: Secondary | ICD-10-CM | POA: Diagnosis not present

## 2022-12-01 DIAGNOSIS — I6529 Occlusion and stenosis of unspecified carotid artery: Secondary | ICD-10-CM

## 2022-12-01 DIAGNOSIS — Z9889 Other specified postprocedural states: Secondary | ICD-10-CM

## 2022-12-01 DIAGNOSIS — N3090 Cystitis, unspecified without hematuria: Secondary | ICD-10-CM | POA: Diagnosis not present

## 2022-12-01 DIAGNOSIS — N2889 Other specified disorders of kidney and ureter: Secondary | ICD-10-CM | POA: Diagnosis not present

## 2022-12-01 DIAGNOSIS — N179 Acute kidney failure, unspecified: Secondary | ICD-10-CM | POA: Diagnosis not present

## 2022-12-01 DIAGNOSIS — R0600 Dyspnea, unspecified: Secondary | ICD-10-CM | POA: Diagnosis not present

## 2022-12-01 HISTORY — DX: Other pulmonary embolism without acute cor pulmonale: I26.99

## 2022-12-01 LAB — BASIC METABOLIC PANEL
Anion gap: 10 (ref 5–15)
BUN: 30 mg/dL — ABNORMAL HIGH (ref 8–23)
CO2: 24 mmol/L (ref 22–32)
Calcium: 8.8 mg/dL — ABNORMAL LOW (ref 8.9–10.3)
Chloride: 90 mmol/L — ABNORMAL LOW (ref 98–111)
Creatinine, Ser: 1.38 mg/dL — ABNORMAL HIGH (ref 0.61–1.24)
GFR, Estimated: 55 mL/min — ABNORMAL LOW (ref >60)
Glucose, Bld: 263 mg/dL — ABNORMAL HIGH (ref 70–99)
Potassium: 4.1 mmol/L (ref 3.5–5.1)
Sodium: 124 mmol/L — ABNORMAL LOW (ref 135–145)

## 2022-12-01 LAB — URINALYSIS, W/ REFLEX TO CULTURE (INFECTION SUSPECTED)
Bilirubin Urine: NEGATIVE
Glucose, UA: 150 mg/dL — AB
Ketones, ur: NEGATIVE mg/dL
Nitrite: NEGATIVE
Protein, ur: 30 mg/dL — AB
RBC / HPF: >50 RBC/hpf (ref 0–5)
Specific Gravity, Urine: 1.01 (ref 1.005–1.030)
Squamous Epithelial / HPF: NONE SEEN /HPF (ref 0–5)
WBC, UA: >50 WBC/hpf (ref 0–5)
pH: 5 (ref 5.0–8.0)

## 2022-12-01 LAB — CBC
HCT: 31.3 % — ABNORMAL LOW (ref 39.0–52.0)
Hemoglobin: 10.7 g/dL — ABNORMAL LOW (ref 13.0–17.0)
MCH: 28.9 pg (ref 26.0–34.0)
MCHC: 34.2 g/dL (ref 30.0–36.0)
MCV: 84.6 fL (ref 80.0–100.0)
Platelets: 262 10*3/uL (ref 150–400)
RBC: 3.7 MIL/uL — ABNORMAL LOW (ref 4.22–5.81)
RDW: 12.8 % (ref 11.5–15.5)
WBC: 13.5 10*3/uL — ABNORMAL HIGH (ref 4.0–10.5)
nRBC: 0 % (ref 0.0–0.2)

## 2022-12-01 LAB — LACTIC ACID, PLASMA
Lactic Acid, Venous: 1.3 mmol/L (ref 0.5–1.9)
Lactic Acid, Venous: 1.5 mmol/L (ref 0.5–1.9)

## 2022-12-01 LAB — APTT: aPTT: 33 seconds (ref 24–36)

## 2022-12-01 LAB — HEPATIC FUNCTION PANEL
ALT: 26 U/L (ref 0–44)
AST: 17 U/L (ref 15–41)
Albumin: 3.5 g/dL (ref 3.5–5.0)
Alkaline Phosphatase: 114 U/L (ref 38–126)
Bilirubin, Direct: 0.3 mg/dL — ABNORMAL HIGH (ref 0.0–0.2)
Indirect Bilirubin: 1 mg/dL — ABNORMAL HIGH (ref 0.3–0.9)
Total Bilirubin: 1.3 mg/dL — ABNORMAL HIGH (ref 0.3–1.2)
Total Protein: 7.7 g/dL (ref 6.5–8.1)

## 2022-12-01 LAB — PROTIME-INR
INR: 1.2 (ref 0.8–1.2)
Prothrombin Time: 15.3 seconds — ABNORMAL HIGH (ref 11.4–15.2)

## 2022-12-01 MED ORDER — ONDANSETRON HCL 4 MG/2ML IJ SOLN: 4.0000 mg | Freq: Four times a day (QID) | INTRAMUSCULAR | Status: DC | PRN

## 2022-12-01 MED ORDER — MORPHINE SULFATE (PF) 4 MG/ML IV SOLN
4.0000 mg | Freq: Once | INTRAVENOUS | Status: AC
  Administered 2022-12-01: 4 mg via INTRAVENOUS
  Filled 2022-12-01: qty 1

## 2022-12-01 MED ORDER — HEPARIN BOLUS VIA INFUSION
6600.0000 [IU] | Freq: Once | INTRAVENOUS | Status: AC
  Administered 2022-12-01: 6600 [IU] via INTRAVENOUS
  Filled 2022-12-01: qty 6600

## 2022-12-01 MED ORDER — HEPARIN (PORCINE) 25000 UT/250ML-% IV SOLN
3250.0000 [IU]/h | INTRAVENOUS | Status: DC
  Administered 2022-12-01: 1550 [IU]/h via INTRAVENOUS
  Administered 2022-12-02: 1850 [IU]/h via INTRAVENOUS
  Administered 2022-12-03: 2800 [IU]/h via INTRAVENOUS
  Administered 2022-12-03: 2250 [IU]/h via INTRAVENOUS
  Administered 2022-12-04: 3250 [IU]/h via INTRAVENOUS
  Administered 2022-12-04: 3050 [IU]/h via INTRAVENOUS
  Administered 2022-12-05: 3250 [IU]/h via INTRAVENOUS
  Filled 2022-12-01: qty 500
  Filled 2022-12-01 (×10): qty 250

## 2022-12-01 MED ORDER — LACTATED RINGERS IV BOLUS
1000.0000 mL | Freq: Once | INTRAVENOUS | Status: AC
  Administered 2022-12-01: 1000 mL via INTRAVENOUS

## 2022-12-01 MED ORDER — LACTATED RINGERS IV SOLN: INTRAVENOUS | Status: DC

## 2022-12-01 MED ORDER — OXYCODONE HCL 5 MG PO TABS
5.0000 mg | ORAL_TABLET | ORAL | Status: DC | PRN
  Administered 2022-12-03 – 2022-12-04 (×4): 5 mg via ORAL
  Filled 2022-12-01 (×4): qty 1

## 2022-12-01 MED ORDER — ACETAMINOPHEN 325 MG PO TABS
650.0000 mg | ORAL_TABLET | Freq: Four times a day (QID) | ORAL | Status: DC | PRN
  Administered 2022-12-02 – 2022-12-04 (×2): 650 mg via ORAL
  Filled 2022-12-01 (×2): qty 2

## 2022-12-01 MED ORDER — SODIUM CHLORIDE 0.9 % IV SOLN
2.0000 g | Freq: Once | INTRAVENOUS | Status: AC
  Administered 2022-12-01: 2 g via INTRAVENOUS
  Filled 2022-12-01: qty 12.5

## 2022-12-01 MED ORDER — IOHEXOL 300 MG/ML  SOLN
100.0000 mL | Freq: Once | INTRAMUSCULAR | Status: AC | PRN
  Administered 2022-12-01: 100 mL via INTRAVENOUS

## 2022-12-01 MED ORDER — ONDANSETRON HCL 4 MG PO TABS: 4.0000 mg | ORAL_TABLET | Freq: Four times a day (QID) | ORAL | Status: DC | PRN

## 2022-12-01 MED ORDER — MORPHINE SULFATE (PF) 2 MG/ML IV SOLN
2.0000 mg | INTRAVENOUS | Status: DC | PRN
  Administered 2022-12-02 – 2022-12-03 (×7): 2 mg via INTRAVENOUS
  Filled 2022-12-01 (×7): qty 1

## 2022-12-01 MED ORDER — ACETAMINOPHEN 650 MG RE SUPP: 650.0000 mg | Freq: Four times a day (QID) | RECTAL | Status: DC | PRN

## 2022-12-01 MED ORDER — SODIUM CHLORIDE 0.9 % IV BOLUS
1000.0000 mL | Freq: Once | INTRAVENOUS | Status: AC
  Administered 2022-12-01: 1000 mL via INTRAVENOUS

## 2022-12-01 MED ORDER — IOHEXOL 350 MG/ML SOLN
75.0000 mL | Freq: Once | INTRAVENOUS | Status: AC | PRN
  Administered 2022-12-01: 75 mL via INTRAVENOUS

## 2022-12-01 MED ORDER — SODIUM CHLORIDE 0.9 % IV BOLUS: 500.0000 mL | Freq: Once | INTRAVENOUS | Status: DC

## 2022-12-01 MED ORDER — LACTATED RINGERS IV SOLN
150.0000 mL/h | INTRAVENOUS | Status: DC
  Administered 2022-12-02: 150 mL/h via INTRAVENOUS

## 2022-12-01 NOTE — Assessment & Plan Note (Signed)
Suspect dehydration from poor oral intake Will get urine and sodium osmolality and urine sodium IV hydration with NS

## 2022-12-01 NOTE — Assessment & Plan Note (Signed)
Patient has recent prolonged hospitalization, recent back surgery with chronic comorbidities and current acute illnesses Currently at SNF s/p recent hospitalization from 5/10 to 5/16 PT eval and treat while in-house

## 2022-12-01 NOTE — ED Provider Notes (Signed)
[facility] Provider Note    Event Date/Time   First MD Initiated Contact with Patient 12/01/22 1741     (approximate)   History   Urinary Tract Infection and Altered Mental Status   HPI  Alexander Wilcox is a 72 y.o. male past medical history significant for BPH, diabetes, hypertension, history of prostate cancer, recent spine surgery with neurosurgery, who presents to the emergency department for altered mental status.  Worsening altered mental status that has been progressing over the past 2 days.  Patient is currently receiving antibiotics for urinary tract infection.  Patient has had a Foley catheter in place and is followed by urology.  No new falls or trauma.  Worsening altered mental status, history is provided by the patient's wife and daughter at bedside.   Physical Exam   Triage Vital Signs: ED Triage Vitals [12/01/22 1633]  Enc Vitals Group     BP (!) 142/64     Pulse Rate (!) 106     Resp 20     Temp 97.7 F (36.5 C)     Temp src      SpO2 96 %     Weight 256 lb (116.1 kg)     Height 5\' 8"  (1.727 m)     Head Circumference      Peak Flow      Pain Score 6     Pain Loc      Pain Edu?      Excl. in GC?     Most recent vital signs: Vitals:   12/01/22 2230 12/01/22 2305  BP: (!) 150/67 (!) 158/74  Pulse: 96 98  Resp: 19 18  Temp:  97.9 F (36.6 C)  SpO2: 100% 100%    Physical Exam Constitutional:      Appearance: He is well-developed. He is obese. He is ill-appearing.  HENT:     Head: Atraumatic.  Eyes:     Conjunctiva/sclera: Conjunctivae normal.  Cardiovascular:     Rate and Rhythm: Regular rhythm. Tachycardia present.     Heart sounds: No murmur heard. Pulmonary:     Effort: No respiratory distress.  Abdominal:     Tenderness: There is abdominal tenderness.  Genitourinary:    Comments: Foley catheter in place  Musculoskeletal:     Cervical back: Normal range of motion.     Right lower leg: No edema.     Left  lower leg: No edema.  Skin:    General: Skin is warm.  Neurological:     Mental Status: He is alert. Mental status is at baseline.     IMPRESSION / MDM / ASSESSMENT AND PLAN / ED COURSE  I reviewed the triage vital signs and the nursing notes.  Differential diagnosis including sepsis, pneumonia, complicated UTI, intracranial hemorrhage, wound infection  Dr. Marcell Barlow was in the emergency department and evaluated the patient's wound, no concern for an infection.  EKG  I, Corena Herter, the attending physician, personally viewed and interpreted this ECG.   Rate: 101  Rhythm: Sinus tachycardia  Axis: Normal  Intervals: Normal  ST&T Change: None  No tachycardic or bradycardic dysrhythmias while on cardiac telemetry.  RADIOLOGY I independently reviewed imaging, my interpretation of imaging: CT abdomen and pelvis with no findings of acute appendicitis.  No signs of a bowel obstruction.  Distended bladder with air in the bladder concerning for acute cystitis.  Discussed the read with the radiologist, concern for possible filling defect to the right lower pulmonary  arteries concerning for possible pulmonary embolism and recommended a CTA PE study.  CTA PE study read as acute pulmonary embolism.  LABS (all labs ordered are listed, but only abnormal results are displayed) Labs interpreted as -    Labs Reviewed  CBC - Abnormal; Notable for the following components:      Result Value   WBC 13.5 (*)    RBC 3.70 (*)    Hemoglobin 10.7 (*)    HCT 31.3 (*)    All other components within normal limits  BASIC METABOLIC PANEL - Abnormal; Notable for the following components:   Sodium 124 (*)    Chloride 90 (*)    Glucose, Bld 263 (*)    BUN 30 (*)    Creatinine, Ser 1.38 (*)    Calcium 8.8 (*)    GFR, Estimated 55 (*)    All other components within normal limits  URINALYSIS, W/ REFLEX TO CULTURE (INFECTION SUSPECTED) - Abnormal; Notable for the following components:   Color,  Urine AMBER (*)    APPearance TURBID (*)    Glucose, UA 150 (*)    Hgb urine dipstick LARGE (*)    Protein, ur 30 (*)    Leukocytes,Ua LARGE (*)    Bacteria, UA MANY (*)    All other components within normal limits  HEPATIC FUNCTION PANEL - Abnormal; Notable for the following components:   Total Bilirubin 1.3 (*)    Bilirubin, Direct 0.3 (*)    Indirect Bilirubin 1.0 (*)    All other components within normal limits  PROTIME-INR - Abnormal; Notable for the following components:   Prothrombin Time 15.3 (*)    All other components within normal limits  CULTURE, BLOOD (ROUTINE X 2)  CULTURE, BLOOD (ROUTINE X 2)  URINE CULTURE  LACTIC ACID, PLASMA  LACTIC ACID, PLASMA  APTT  BASIC METABOLIC PANEL  CBC  OSMOLALITY, URINE  OSMOLALITY  SODIUM, URINE, RANDOM  PROCALCITONIN     MDM  Patient found to be hyponatremic likely in the setting of dehydration.  Blood cultures added on.  Leukocytosis and findings of a complicated urinary tract infection.  Foley catheter was exchanged.  Patient was started on IV cefepime.  No concern for wound infection from his recent surgery.  CTA with findings of a pulmonary embolism but no signs of right heart strain, started on heparin bolus and infusion.  Consulted hospitalist for admission for altered mental status, your complicated urinary tract infection and pulmonary embolism.  PROCEDURES:  Critical Care performed: yes  .Critical Care  Performed by: Corena Herter, MD Authorized by: Corena Herter, MD   Critical care provider statement:    Critical care time (minutes):  45   Critical care time was exclusive of:  Separately billable procedures and treating other patients   Critical care was necessary to treat or prevent imminent or life-threatening deterioration of the following conditions:  Cardiac failure and metabolic crisis (Pulmonary embolism)   Critical care was time spent personally by me on the following activities:  Development of  treatment plan with patient or surrogate, discussions with consultants, evaluation of patient's response to treatment, examination of patient, ordering and review of laboratory studies, ordering and review of radiographic studies, ordering and performing treatments and interventions, pulse oximetry, re-evaluation of patient's condition and review of old charts   Patient's presentation is most consistent with acute presentation with potential threat to life or bodily function.   MEDICATIONS ORDERED IN ED: Medications  lactated ringers infusion ( Intravenous New  Bag/Given 12/01/22 1950)  heparin ADULT infusion 100 units/mL (25000 units/251mL) (1,550 Units/hr Intravenous New Bag/Given 12/01/22 2300)  lactated ringers infusion (has no administration in time range)  acetaminophen (TYLENOL) tablet 650 mg (has no administration in time range)    Or  acetaminophen (TYLENOL) suppository 650 mg (has no administration in time range)  oxyCODONE (Oxy IR/ROXICODONE) immediate release tablet 5 mg (has no administration in time range)  ondansetron (ZOFRAN) tablet 4 mg (has no administration in time range)    Or  ondansetron (ZOFRAN) injection 4 mg (has no administration in time range)  morphine (PF) 2 MG/ML injection 2 mg (has no administration in time range)  sodium chloride 0.9 % bolus 1,000 mL (0 mLs Intravenous Stopped 12/01/22 2125)  morphine (PF) 4 MG/ML injection 4 mg (4 mg Intravenous Given 12/01/22 1946)  ceFEPIme (MAXIPIME) 2 g in sodium chloride 0.9 % 100 mL IVPB (0 g Intravenous Stopped 12/01/22 2017)  iohexol (OMNIPAQUE) 300 MG/ML solution 100 mL (100 mLs Intravenous Contrast Given 12/01/22 2001)  iohexol (OMNIPAQUE) 350 MG/ML injection 75 mL (75 mLs Intravenous Contrast Given 12/01/22 2204)  lactated ringers bolus 1,000 mL (0 mLs Intravenous Stopped 12/01/22 2352)  morphine (PF) 4 MG/ML injection 4 mg (4 mg Intravenous Given 12/01/22 2137)  heparin bolus via infusion 6,600 Units (6,600 Units  Intravenous Bolus from Bag 12/01/22 2302)    FINAL CLINICAL IMPRESSION(S) / ED DIAGNOSES   Final diagnoses:  Complicated UTI (urinary tract infection)  Altered mental status, unspecified altered mental status type  Other acute pulmonary embolism without acute cor pulmonale (HCC)     Rx / DC Orders   ED Discharge Orders     None        Note:  This document was prepared using Dragon voice recognition software and may include unintentional dictation errors.   Corena Herter, MD 12/01/22 [phone]

## 2022-12-01 NOTE — Assessment & Plan Note (Signed)
Blood sugar 263 Continue basal insulin Sliding scale insulin coverage

## 2022-12-01 NOTE — Assessment & Plan Note (Signed)
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 -Hold aspirin while on systemic anticoagulation - Continue atorvastatin and fenofibrate

## 2022-12-01 NOTE — Assessment & Plan Note (Signed)
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 Marcell Barlow in the ED with low suspicion for postoperative wound infection Continue multimodal pain control with gabapentin, duloxetine and narcotics as needed

## 2022-12-01 NOTE — Sepsis Progress Note (Signed)
Following for sepsis monitoring ?

## 2022-12-01 NOTE — Assessment & Plan Note (Signed)
Continue metoprolol As needed hydralazine Will hold lisinopril HCTZ given hyponatremia and AKI

## 2022-12-01 NOTE — ED Triage Notes (Signed)
Pt to ED [facility] from [facility] for rehab from [facility] fx. Dx with UTI 3 days ago. Has been taking antibiotics. Wife reports AMS Pt slow to answer orientation questions.

## 2022-12-01 NOTE — ED Notes (Signed)
Patient transported to CT 

## 2022-12-01 NOTE — Code Documentation (Signed)
CODE SEPSIS - PHARMACY COMMUNICATION  **Broad Spectrum Antibiotics should be administered within 1 hour of Sepsis diagnosis**  Time Code Sepsis Called/Page Received: 1912  Antibiotics Ordered: cefepime  Time of 1st antibiotic administration: 1954  Additional action taken by pharmacy:   If necessary, Name of Provider/Nurse Contacted:     Sharen Hones ,PharmD Clinical Pharmacist  12/01/2022  7:58 PM

## 2022-12-01 NOTE — ED Notes (Signed)
Patient has returned from CT, and has been transferred from a stretcher to a hospital bed for comfort.

## 2022-12-01 NOTE — Assessment & Plan Note (Addendum)
Creatinine 1.38 above baseline of 1 Suspecting related to dehydration from poor oral intake Will hold lisinopril, HCTZ, metformin.  Avoid nephrotoxins

## 2022-12-01 NOTE — H&P (Signed)
History and Physical    Patient: Alexander Wilcox DOB: 06-19-1951 DOA: 12/01/2022 DOS: the patient was seen and examined on 12/01/2022 PCP: Marisue Ivan, MD  Patient coming from: SNF  Chief Complaint:  Chief Complaint  Patient presents with   Urinary Tract Infection   Altered Mental Status    HPI: Alexander Wilcox is a 72 y.o. male with medical history significant for Wilcox, Alexander Wilcox, Alexander Wilcox, Alexander Wilcox, Alexander Wilcox, Alexander Wilcox, Alexander Wilcox fracture for which he underwent T7-T12 fusion, at the same time undergoing bladder neck dilatation for stricture with placement of Foley, with stay complicated by acute neurologic deficit, ruled out for stroke but with findings of stenosis of the vertebral, internal carotid and basilar arteries, treated medically who was sent from rehab for evaluation of altered mental status.  Patient was diagnosed with a UTI 3 days prior and started on antibiotics but was noted to be altered after the third day of his treatment. ED course and data review: Mildly tachycardic to 106 on arrival but with otherwise normal vitals Labs with WBC 13,000 and lactic acid 1.5.  Urinalysis consistent with UTI.  Hemoglobin 10.7, slightly below baseline of around 11.3.  Other labs notable for creatinine 1.38 up from baseline of 1.0, hyperglycemia of 263 with normal anion gap and sodium of 124, down from 131 at discharge a couple weeks prior. ED Course and data review: EKG, personally viewed and interpreted showing sinus tachycardia at 101 with no concerning ST-T wave changes. CT head nonacute CT abdomen and pelvis showing cystitis and multilevel severe degenerative changes of the spine as well as evidence of pulmonary emboli CTA chest confirms multiple segmental and subsegmental filling defects in the right lower  lobe compatible with multiple pulmonary emboli without evidence of right heart strain. Dr. Marcell Barlow was consulted from the ED to evaluate for possible complication related to back surgery however he assessed that there was low suspicion for postoperative wound infection. Patient was started on cefepime for complicated UTI and started on a heparin infusion for PE.  Foley was exchanged Hospitalist consulted for admission.     Past Medical History:  Diagnosis Date   Arthritis    Diabetes mellitus without complication (HCC)    Hypertension    Alexander cancer (HCC)    Sleep apnea    Past Surgical History:  Procedure Laterality Date   APPENDECTOMY     BACK SURGERY     CYSTOSCOPY  11/12/2022   Procedure: Cystoscopy, dilation of bladder neck contracture, complex Foley catheter placement;  Surgeon: Sondra Come, MD;  Location: [facility] ORS;  Service: Urology;;   KNEE ARTHROPLASTY     Alexander SURGERY     Social History:  reports that he quit smoking about 26 years ago. His smoking use included cigarettes. He has a 60.00 pack-year smoking history. He has never used smokeless tobacco. He reports current alcohol use. He reports that he does not currently use drugs.  Allergies  Allergen Reactions   Dilaudid [Hydromorphone Hcl] Itching    Family History  Problem Relation Age of Onset   Diabetes Sister    Diabetes Maternal Grandmother    Lung disease Brother    Kidney failure Sister     Prior to Admission medications   Medication Sig Start Date End Date Taking? Authorizing Provider  aspirin EC 81 MG tablet Take 81  mg by mouth daily.    [provider]  atorvastatin (LIPITOR) 40 MG tablet Take 40 mg by mouth daily.    [provider]  DULoxetine (CYMBALTA) 30 MG capsule Take 1 capsule (30 mg total) by mouth daily. 06/20/18   Gearldine Bienenstock, PA-C  fenofibrate 160 MG tablet TAKE 1 TABLET DAILY 01/25/18   [provider]  gabapentin (NEURONTIN) 800 MG tablet Take  1 tablet (800 mg total) by mouth 3 (three) times daily. 11/18/22 12/18/22  Dorcas Carrow, MD  insulin glargine-yfgn (SEMGLEE) 100 UNIT/ML injection Inject 0.1 mLs (10 Units total) into the skin at bedtime. 11/18/22   Dorcas Carrow, MD  Insulin Pen Needle (ULTIGUARD SAFEPACK PEN NEEDLE) 32G X 4 MM MISC Use 1 each once daily 06/15/21   [provider]  Lancets 30G MISC 1 each. 11/03/16   [provider]  lisinopril-hydrochlorothiazide (ZESTORETIC) 10-12.5 MG tablet Take 1 tablet by mouth daily. 12/04/21   [provider]  metFORMIN (GLUCOPHAGE-XR) 500 MG 24 hr tablet Take 1 tablet by mouth 2 (two) times daily. Pt taking one in the morning and one at night 11/04/20   [provider]  metoprolol succinate (TOPROL-XL) 25 MG 24 hr tablet Take 25 mg by mouth 2 (two) times daily. 01/06/17   [provider]  nortriptyline (PAMELOR) 50 MG capsule Take 50 mg by mouth at bedtime.    [provider]  omeprazole (PRILOSEC) 40 MG capsule Take 40 mg by mouth daily. 04/03/20 04/03/21  [provider]  pregabalin (LYRICA) 75 MG capsule Take 1 capsule (75 mg total) by mouth 2 (two) times daily. 11/18/22 12/18/22  Dorcas Carrow, MD    Physical Exam: Vitals:   12/01/22 2040 12/01/22 2105 12/01/22 2230 12/01/22 2305  BP: (!) 111/58 (!) 126/57 (!) 150/67 (!) 158/74  Pulse: 96 96 96 98  Resp: (!) 22 17 19 18   Temp:    97.9 F (36.6 C)  TempSrc:    Oral  SpO2: 100% 100% 100% 100%  Weight:      Height:       Physical Exam Vitals and nursing note reviewed.  Constitutional:      General: He is not in acute distress.    Comments: Confused and restless in bed, pulling at Foley  HENT:     Head: Normocephalic and atraumatic.  Cardiovascular:     Rate and Rhythm: Normal rate and regular rhythm.     Heart sounds: Normal heart sounds.  Pulmonary:     Effort: Pulmonary effort is normal.     Breath sounds: Normal breath sounds.  Abdominal:     Palpations: Abdomen  is soft.     Tenderness: There is no abdominal tenderness.  Neurological:     General: No focal deficit present.     Mental Status: He is disoriented.     Labs on Admission: I have personally reviewed following labs and imaging studies  CBC: Recent Labs  Lab 12/01/22 1635  WBC 13.5*  HGB 10.7*  HCT 31.3*  MCV 84.6  PLT 262   Basic Metabolic Panel: Recent Labs  Lab 12/01/22 1635  NA 124*  K 4.1  CL 90*  CO2 24  GLUCOSE 263*  BUN 30*  CREATININE 1.38*  CALCIUM 8.8*   GFR: Estimated Creatinine Clearance: 61.6 mL/min (A) (by C-G formula based on SCr of 1.38 mg/dL (H)). Liver Function Tests: Recent Labs  Lab 12/01/22 1933  AST 17  ALT 26  ALKPHOS 114  BILITOT  1.3*  PROT 7.7  ALBUMIN 3.5   No results for input(s): "LIPASE", "AMYLASE" in the last 168 hours. No results for input(s): "AMMONIA" in the last 168 hours. Coagulation Profile: Recent Labs  Lab 12/01/22 1933  INR 1.2   Cardiac Enzymes: No results for input(s): "CKTOTAL", "CKMB", "CKMBINDEX", "TROPONINI" in the last 168 hours. BNP (last 3 results) No results for input(s): "PROBNP" in the last 8760 hours. HbA1C: No results for input(s): "HGBA1C" in the last 72 hours. CBG: No results for input(s): "GLUCAP" in the last 168 hours. Lipid Profile: No results for input(s): "CHOL", "HDL", "LDLCALC", "TRIG", "CHOLHDL", "LDLDIRECT" in the last 72 hours. Thyroid Function Tests: No results for input(s): "TSH", "T4TOTAL", "FREET4", "T3FREE", "THYROIDAB" in the last 72 hours. Anemia Panel: No results for input(s): "VITAMINB12", "FOLATE", "FERRITIN", "TIBC", "IRON", "RETICCTPCT" in the last 72 hours. Urine analysis:    Component Value Date/Time   COLORURINE AMBER (A) 12/01/2022 1933   APPEARANCEUR TURBID (A) 12/01/2022 1933   LABSPEC 1.010 12/01/2022 1933   PHURINE 5.0 12/01/2022 1933   GLUCOSEU 150 (A) 12/01/2022 1933   HGBUR LARGE (A) 12/01/2022 1933   BILIRUBINUR NEGATIVE 12/01/2022 1933   KETONESUR  NEGATIVE 12/01/2022 1933   PROTEINUR 30 (A) 12/01/2022 1933   UROBILINOGEN 0.2 06/10/2008 0902   NITRITE NEGATIVE 12/01/2022 1933   LEUKOCYTESUR LARGE (A) 12/01/2022 1933    Radiological Exams on Admission: CT Angio Chest PE W and/or Wo Contrast  Result Date: 12/01/2022 CLINICAL DATA:  Pulmonary embolism (PE) suspected, high prob EXAM: CT ANGIOGRAPHY CHEST WITH CONTRAST TECHNIQUE: Multidetector CT imaging of the chest was performed using the standard protocol during bolus administration of intravenous contrast. Multiplanar CT image reconstructions and MIPs were obtained to evaluate the vascular anatomy. RADIATION DOSE REDUCTION: This exam was performed according to the departmental dose-optimization program which includes automated exposure control, adjustment of the mA and/or kV according to patient size and/or use of iterative reconstruction technique. CONTRAST:  75mL OMNIPAQUE IOHEXOL 350 MG/ML SOLN COMPARISON:  11/11/2022 FINDINGS: Cardiovascular: Multiple filling defects within right lower lobe pulmonary arterial branches compatible with multiple pulmonary emboli. No definite pulmonary emboli on the left. No evidence of right heart strain. Coronary artery and aortic calcifications. No evidence of aortic aneurysm. Mediastinum/Nodes: No mediastinal, hilar, or axillary adenopathy. Trachea and esophagus are unremarkable. Thyroid unremarkable. Lungs/Pleura: Lungs are clear. No focal airspace opacities or suspicious nodules. No effusions. Upper Abdomen: No acute findings Musculoskeletal: Chest wall soft tissues are unremarkable. No acute bony abnormality. Postoperative changes in the thoracic spine from posterior fusion. Review of the MIP images confirms the above findings. IMPRESSION: Multiple segmental and subsegmental filling defects in the right lower lobe compatible with multiple pulmonary emboli. No evidence of right heart strain. Scattered aortic atherosclerosis and coronary artery disease. These  results were called by telephone at the time of interpretation on 12/01/2022 at 10:25 pm to provider [facility] , who verbally acknowledged these results. Electronically Signed   By: Charlett Nose M.D.   On: 12/01/2022 22:27   CT ABDOMEN PELVIS W CONTRAST  Result Date: 12/01/2022 CLINICAL DATA:  Sepsis Abdominal pain, acute, nonlocalized EXAM: CT ABDOMEN AND PELVIS WITH CONTRAST TECHNIQUE: Multidetector CT imaging of the abdomen and pelvis was performed using the standard protocol following bolus administration of intravenous contrast. RADIATION DOSE REDUCTION: This exam was performed according to the departmental dose-optimization program which includes automated exposure control, adjustment of the mA and/or kV according to patient size and/or use of iterative reconstruction technique. CONTRAST:  OMNIPAQUE  IOHEXOL 300 MG/ML  SOLN COMPARISON:  None Available. FINDINGS: Lower chest: Filling defects of the right lower lobe pulmonary arteries. No acute abnormality. Hepatobiliary: The liver is enlarged measuring up to 21.5 cm. No focal liver abnormality. Nonspecific hydropic gallbladder. No gallstones, gallbladder wall thickening, or pericholecystic fluid. No biliary dilatation. Pancreas: No focal lesion. Normal pancreatic contour. No surrounding inflammatory changes. No main pancreatic ductal dilatation. Spleen: Normal in size without focal abnormality. Adrenals/Urinary Tract: No adrenal nodule bilaterally. Bilateral kidneys enhance symmetrically. No frank hydroureteronephrosis. Fullness of bilateral collecting systems. No nephroureterolithiasis. Circumferential urinary bladder wall thickening with perivesicular fat stranding. Foley catheter tip terminates within the urinary bladder lumen. Balloon not well visualized but likely inflated within the urinary bladder lumen. No inflated balloon within the urethra. No excretion of intravenous contrast from either kidneys on delayed view. Stomach/Bowel: Stomach is  within normal limits. No evidence of bowel wall thickening or dilatation. Status post appendectomy. Vascular/Lymphatic: No abdominal aorta or iliac aneurysm. Severe atherosclerotic plaque of the aorta and its branches. No abdominal, pelvic, or inguinal lymphadenopathy. Reproductive: No mass. Other: No intraperitoneal free fluid. No intraperitoneal free gas. No organized fluid collection. Musculoskeletal: No abdominal wall hernia or abnormality. No suspicious lytic or blastic osseous lesions. No acute displaced fracture. Multilevel severe degenerative changes of the spine. Multilevel severe osseous neural foraminal stenosis. Thoracic spine surgical hardware partially visualized. IMPRESSION: 1. Right lower lobe segmental and subsegmental pulmonary emboli. Recommend CT angiography pulmonary artery for further evaluation. 2. Cystitis. 3. No excretion of intravenous contrast from either kidneys on delayed view. Correlate with renal function. 4. Hepatomegaly. 5. Multilevel severe degenerative changes of the spine. Multilevel severe osseous neural foraminal stenosis. 6.  Aortic Atherosclerosis (ICD10-I70.0). These results were called by telephone at the time of interpretation on 12/01/2022 at 9:13 pm to provider [facility] , who verbally acknowledged these results. Electronically Signed   By: Tish Frederickson M.D.   On: 12/01/2022 21:14   CT Head Wo Contrast  Result Date: 12/01/2022 CLINICAL DATA:  Altered mental status EXAM: CT HEAD WITHOUT CONTRAST TECHNIQUE: Contiguous axial images were obtained from the base of the skull through the vertex without intravenous contrast. RADIATION DOSE REDUCTION: This exam was performed according to the departmental dose-optimization program which includes automated exposure control, adjustment of the mA and/or kV according to patient size and/or use of iterative reconstruction technique. COMPARISON:  11/12/2022 FINDINGS: Brain: No acute intracranial findings are seen. There are no  signs of bleeding within the cranium. Cortical sulci are prominent. There is no focal edema or mass effect. Ventricles are not dilated. Vascular: Unremarkable. Skull: No acute findings are seen. Degenerative changes are noted in upper cervical spine at C1-C2 level. Sinuses/Orbits: No acute findings are seen. Other: None. IMPRESSION: No acute intracranial findings are seen.  Atrophy. Electronically Signed   By: Ernie Avena M.D.   On: 12/01/2022 20:37   DG Chest Port 1 View  Result Date: 12/01/2022 CLINICAL DATA:  History of recent UTI treated with antibiotics, presenting with altered mental status. EXAM: PORTABLE CHEST 1 VIEW COMPARISON:  Nov 11, 2022 FINDINGS: The heart size and mediastinal contours are within normal limits. There is marked severity calcification of the aortic arch. Low lung volumes are noted. There is no evidence of an acute infiltrate, pleural effusion or pneumothorax. Interval postoperative changes are seen involving the mid to lower thoracic spine and upper lumbar spine, since the prior exam. A chronic deformity is seen involving the neck of the proximal left humerus. IMPRESSION: 1.  Low lung volumes without evidence of acute or active cardiopulmonary disease. 2. Interval postoperative changes within the thoracolumbar spine since the prior study. Electronically Signed   By: Aram Candela M.D.   On: 12/01/2022 20:05     Data Reviewed: Relevant notes from primary care and specialist visits, past discharge summaries as available in EHR, including Care Everywhere. Prior diagnostic testing as pertinent to current admission diagnoses Updated medications and problem lists for reconciliation ED course, including vitals, labs, imaging, treatment and response to treatment Triage notes, nursing and pharmacy notes and ED provider's notes Notable results as noted in HPI   Assessment and Plan: * Catheter-associated urinary tract infection (HCC) History of bladder neck dilatation  11/12/2022 with indwelling Foley Alexander Wilcox Possible sepsis Patient presents with weakness, altered mental status in the setting of UTI on outpatient antibiotics.  Has tachycardia, leukocytosis with normal lactic acid.  AKI, altered mental status Continue cefepime given recent instrumentation Sepsis fluids Follow cultures Foley was exchanged in the ED  Multiple pulmonary emboli (HCC) Continue heparin infusion Provoked PE from recent surgery, immobilization Supplemental oxygen if needed Incentive spirometry  Acute metabolic encephalopathy Secondary to UTI and PE CT head nonacute Neurologic checks Aspiration precautions  AKI (acute kidney injury) (HCC) Creatinine 1.38 above baseline of 1 Suspecting related to dehydration from poor oral intake Will hold lisinopril, HCTZ, metformin.  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 Marcell Barlow in the ED with low suspicion for postoperative wound infection Continue multimodal pain control with gabapentin, duloxetine and narcotics as needed  Hyponatremia Suspect dehydration from poor oral intake Will get urine and sodium osmolality and urine sodium IV hydration with NS  Uncontrolled type 2 diabetes mellitus with hyperglycemia, with long-term current use of insulin (HCC) Blood sugar 263 Continue basal insulin Sliding scale insulin coverage  Carotid artery stenosis Possible prior TIA 11/12/2022 -CTA 11/12/22 showed Occluded right vertebral artery ...,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 -Hold aspirin while on systemic anticoagulation - Continue atorvastatin and fenofibrate   Frailty Patient has recent prolonged hospitalization, recent back surgery with chronic comorbidities and  current acute illnesses Currently at SNF s/p recent hospitalization from 5/10 to 5/16 PT eval and treat while in-house  Alexander Wilcox Continue Wilcox  Essential hypertension Continue metoprolol As needed hydralazine Will hold lisinopril HCTZ given hyponatremia and AKI   DVT prophylaxis: Heparin  Consults: none  Advance Care Planning:   Code Status: Prior   Family Communication: none  Disposition Plan: Back to previous home environment  Severity of Illness: The appropriate patient status for this patient is INPATIENT. Inpatient status is judged to be reasonable and necessary in order to provide the required intensity of service to ensure the patient's safety. The patient's presenting symptoms, physical exam findings, and initial radiographic and laboratory data in the context of their chronic comorbidities is felt to place them at high risk for further clinical deterioration. Furthermore, it is not anticipated that the patient will be medically stable for discharge from the hospital within 2 midnights of admission.   * I certify that at the point of admission it is my clinical judgment that the patient will require inpatient hospital care spanning beyond 2 midnights from the point of admission due to high intensity of service, high risk for further deterioration and high  frequency of surveillance required.*  Author: Andris Baumann, MD 12/01/2022 11:21 PM  For on call review [email or web address].

## 2022-12-01 NOTE — Assessment & Plan Note (Addendum)
Continue heparin infusion Provoked PE from recent surgery, immobilization Supplemental oxygen if needed Incentive spirometry

## 2022-12-01 NOTE — Consult Note (Signed)
PHARMACY -  BRIEF ANTIBIOTIC NOTE   Pharmacy has received consult(s) for cefepime from an ED provider.  The patient's profile has been reviewed for ht/wt/allergies/indication/available labs.    One time order(s) placed for  Cefepime 2 gram  Further antibiotics/pharmacy consults should be ordered by admitting physician if indicated.                       Thank you, Sharen Hones, PharmD, BCPS Clinical Pharmacist   12/01/2022  7:15 PM

## 2022-12-01 NOTE — Assessment & Plan Note (Signed)
Continue CPAP.  

## 2022-12-01 NOTE — Consult Note (Signed)
Consult requested by:  Dr. Arnoldo Morale  Consult requested for:  Possible sepsis  Primary Physician:  Marisue Ivan, MD  History of Present Illness: 12/01/2022 Alexander Wilcox is here today with a chief complaint of altered mental status.  He underwent surgery for an unstable thoracic fracture approximately 3 weeks ago.  He presents from [facility] with urinary tract infection that was known from 3 days ago.  He has had multiple difficulties with his urinary catheter and has had it flushed multiple times.  He apparently was not at his baseline mental status and was brought in for evaluation.  He is in no change neurologically.   Review of Systems:  A 10 point review of systems is unobtainable  Past Medical History: Past Medical History:  Diagnosis Date   Arthritis    Diabetes mellitus without complication (HCC)    Hypertension    Prostate cancer (HCC)    Sleep apnea     Past Surgical History: Past Surgical History:  Procedure Laterality Date   APPENDECTOMY     BACK SURGERY     CYSTOSCOPY  11/12/2022   Procedure: Cystoscopy, dilation of bladder neck contracture, complex Foley catheter placement;  Surgeon: Sondra Come, MD;  Location: [facility] ORS;  Service: Urology;;   KNEE ARTHROPLASTY     PROSTATE SURGERY      Allergies: Allergies as of 12/01/2022 - Review Complete 12/01/2022  Allergen Reaction Noted   Dilaudid [hydromorphone hcl] Itching 01/22/2016    Medications: No outpatient medications have been marked as taking for the 12/01/22 encounter [facility] Encounter).    Social History: Social History   Tobacco Use   Smoking status: Former    Packs/day: 2.00    Years: 30.00    Additional pack years: 0.00    Total pack years: 60.00    Types: Cigarettes    Quit date: 07/05/1996    Years since quitting: 26.4   Smokeless tobacco: Never  Vaping Use   Vaping Use: Never used  Substance Use Topics   Alcohol use: Yes    Comment: daily    Drug use: Not  Currently    Family Medical History: Family History  Problem Relation Age of Onset   Diabetes Sister    Diabetes Maternal Grandmother    Lung disease Brother    Kidney failure Sister     Physical Examination: Vitals:   12/01/22 1633  BP: (!) 142/64  Pulse: (!) 106  Resp: 20  Temp: 97.7 F (36.5 C)  SpO2: 96%    General: Patient is in no apparent distress. Attention to examination is appropriate.  Neck:   Unable to move  Respiratory: Patient is breathing without any difficulty.   NEUROLOGICAL:     Awake, alert, disoriented  Cranial nerves intact.  He is able to move all 4 limbs with weakness in his left lower extremity.  He is not fully compliant with examination.  I evaluated his incision.  He has an area of scabbing in the middle of his incision, but no obvious erythema or induration.  There is no drainage noted    Gait is untested.     Medical Decision Making  Imaging: CT Head 12/01/2022 IMPRESSION: No acute intracranial findings are seen.  Atrophy.     Electronically Signed   By: Ernie Avena M.D.   On: 12/01/2022 20:37    CT A/P reviewed - no obvious fluid collection around the implants  I have personally reviewed the images and agree with the  above interpretation.  Assessment and Plan: Mr. Brouse is a pleasant 72 y.o. male with history of 3 column injury status post fixation 19 days ago.  He presents today with concern for urinary tract infection and possible urosepsis.  I have low suspicion for postoperative wound infection.  Would recommend obtaining urinary sample.  Agree with admission to medical service for workup and treatment of his urinary tract infection.  He may need urology involvement for repositioning of his Foley catheter.  Would recommend continued therapy once he is capable medically.  He does not require wearing a brace.   I have communicated my recommendations to the requesting physician and coordinated care to  facilitate these recommendations.     Chawn Spraggins K. Myer Haff MD, [facility] Neurosurgery

## 2022-12-01 NOTE — Progress Notes (Signed)
ANTICOAGULATION CONSULT NOTE  Pharmacy Consult for heparin infusion Indication: pulmonary embolus  Allergies  Allergen Reactions   Dilaudid [Hydromorphone Hcl] Itching    Patient Measurements: Height: 5\' 8"  (172.7 cm) Weight: 116.1 kg (256 lb) IBW/kg (Calculated) : 68.4 Heparin Dosing Weight: 94.7 kg  Vital Signs: Temp: 97.7 F (36.5 C) (05/29 1633) BP: 150/67 (05/29 2230) Pulse Rate: 96 (05/29 2230)  Labs: Recent Labs    12/01/22 1635 12/01/22 1933  HGB 10.7*  --   HCT 31.3*  --   PLT 262  --   APTT  --  33  LABPROT  --  15.3*  INR  --  1.2  CREATININE 1.38*  --     Estimated Creatinine Clearance: 61.6 mL/min (A) (by C-G formula based on SCr of 1.38 mg/dL (H)).   Medical History: Past Medical History:  Diagnosis Date   Arthritis    Diabetes mellitus without complication (HCC)    Hypertension    Prostate cancer (HCC)    Sleep apnea     Assessment: Pt is a 72 yo male presenting to ED d/t AMS, found with "multiple filling defects within right lower lobe pulmonary arterial branches compatible with multiple pulmonary emboli."  Goal of Therapy:  Heparin level 0.3-0.7 units/ml Monitor platelets by anticoagulation protocol: Yes   Plan:  Bolus 6600 units x 1 Start heparin infusion at 1550 units/hr Will check HL in 8 hr after start of infusion CBC daily while on heparin  Otelia Sergeant, PharmD, [facility] 12/01/2022 10:48 PM

## 2022-12-01 NOTE — Assessment & Plan Note (Signed)
History of bladder neck dilatation 11/12/2022 with indwelling Foley Prostate cancer on leuprolide Possible sepsis Patient presents with weakness, altered mental status in the setting of UTI on outpatient antibiotics.  Has tachycardia, leukocytosis with normal lactic acid.  AKI, altered mental status Continue cefepime given recent instrumentation Sepsis fluids Follow cultures Foley was exchanged in the ED

## 2022-12-01 NOTE — Assessment & Plan Note (Addendum)
Secondary to UTI and PE CT head nonacute Neurologic checks Aspiration precautions

## 2022-12-02 ENCOUNTER — Inpatient Hospital Stay: Payer: Medicare HMO

## 2022-12-02 ENCOUNTER — Encounter: Payer: Self-pay | Admitting: Internal Medicine

## 2022-12-02 DIAGNOSIS — T83511A Infection and inflammatory reaction due to indwelling urethral catheter, initial encounter: Secondary | ICD-10-CM | POA: Diagnosis not present

## 2022-12-02 DIAGNOSIS — G9341 Metabolic encephalopathy: Secondary | ICD-10-CM

## 2022-12-02 DIAGNOSIS — A419 Sepsis, unspecified organism: Secondary | ICD-10-CM | POA: Diagnosis not present

## 2022-12-02 DIAGNOSIS — N390 Urinary tract infection, site not specified: Secondary | ICD-10-CM | POA: Diagnosis not present

## 2022-12-02 DIAGNOSIS — I82442 Acute embolism and thrombosis of left tibial vein: Secondary | ICD-10-CM

## 2022-12-02 DIAGNOSIS — I2699 Other pulmonary embolism without acute cor pulmonale: Secondary | ICD-10-CM

## 2022-12-02 DIAGNOSIS — N179 Acute kidney failure, unspecified: Secondary | ICD-10-CM

## 2022-12-02 DIAGNOSIS — R6520 Severe sepsis without septic shock: Secondary | ICD-10-CM

## 2022-12-02 HISTORY — DX: Acute embolism and thrombosis of left tibial vein: I82.442

## 2022-12-02 LAB — CBC
HCT: 31.2 % — ABNORMAL LOW (ref 39.0–52.0)
Hemoglobin: 10.9 g/dL — ABNORMAL LOW (ref 13.0–17.0)
MCH: 29.1 pg (ref 26.0–34.0)
MCHC: 34.9 g/dL (ref 30.0–36.0)
MCV: 83.2 fL (ref 80.0–100.0)
Platelets: 268 10*3/uL (ref 150–400)
RBC: 3.75 MIL/uL — ABNORMAL LOW (ref 4.22–5.81)
RDW: 12.8 % (ref 11.5–15.5)
WBC: 15.4 10*3/uL — ABNORMAL HIGH (ref 4.0–10.5)
nRBC: 0 % (ref 0.0–0.2)

## 2022-12-02 LAB — HEPARIN LEVEL (UNFRACTIONATED)
Heparin Unfractionated: <0.1 IU/mL — ABNORMAL LOW (ref 0.30–0.70)
Heparin Unfractionated: <0.1 IU/mL — ABNORMAL LOW (ref 0.30–0.70)

## 2022-12-02 LAB — LACTIC ACID, PLASMA
Lactic Acid, Venous: 1.5 mmol/L (ref 0.5–1.9)
Lactic Acid, Venous: 1.2 mmol/L (ref 0.5–1.9)

## 2022-12-02 LAB — BASIC METABOLIC PANEL
Anion gap: 9 (ref 5–15)
BUN: 33 mg/dL — ABNORMAL HIGH (ref 8–23)
CO2: 24 mmol/L (ref 22–32)
Calcium: 8.4 mg/dL — ABNORMAL LOW (ref 8.9–10.3)
Chloride: 91 mmol/L — ABNORMAL LOW (ref 98–111)
Creatinine, Ser: 1.79 mg/dL — ABNORMAL HIGH (ref 0.61–1.24)
GFR, Estimated: 40 mL/min — ABNORMAL LOW (ref >60)
Glucose, Bld: 286 mg/dL — ABNORMAL HIGH (ref 70–99)
Potassium: 4.3 mmol/L (ref 3.5–5.1)
Sodium: 124 mmol/L — ABNORMAL LOW (ref 135–145)

## 2022-12-02 LAB — OSMOLALITY: Osmolality: 280 mOsm/kg (ref 275–295)

## 2022-12-02 LAB — GLUCOSE, CAPILLARY
Glucose-Capillary: 268 mg/dL — ABNORMAL HIGH (ref 70–99)
Glucose-Capillary: 264 mg/dL — ABNORMAL HIGH (ref 70–99)
Glucose-Capillary: 231 mg/dL — ABNORMAL HIGH (ref 70–99)
Glucose-Capillary: 248 mg/dL — ABNORMAL HIGH (ref 70–99)

## 2022-12-02 LAB — PROCALCITONIN: Procalcitonin: 0.23 ng/mL

## 2022-12-02 LAB — OSMOLALITY, URINE: Osmolality, Ur: 234 mOsm/kg — ABNORMAL LOW (ref 300–900)

## 2022-12-02 LAB — CULTURE, BLOOD (ROUTINE X 2)

## 2022-12-02 LAB — SODIUM, URINE, RANDOM: Sodium, Ur: 33 mmol/L

## 2022-12-02 LAB — MRSA NEXT GEN BY PCR, NASAL: MRSA by PCR Next Gen: NOT DETECTED

## 2022-12-02 LAB — BRAIN NATRIURETIC PEPTIDE: B Natriuretic Peptide: 48.1 pg/mL (ref 0.0–100.0)

## 2022-12-02 MED ORDER — CHLORHEXIDINE GLUCONATE CLOTH 2 % EX PADS
6.0000 | MEDICATED_PAD | Freq: Every day | CUTANEOUS | Status: DC
  Administered 2022-12-02 – 2022-12-07 (×6): 6 via TOPICAL

## 2022-12-02 MED ORDER — INSULIN ASPART 100 UNIT/ML IJ SOLN
0.0000 [IU] | Freq: Three times a day (TID) | INTRAMUSCULAR | Status: DC
  Administered 2022-12-02: 5 [IU] via SUBCUTANEOUS
  Administered 2022-12-02: 3 [IU] via SUBCUTANEOUS
  Administered 2022-12-03: 2 [IU] via SUBCUTANEOUS
  Administered 2022-12-03 (×2): 3 [IU] via SUBCUTANEOUS
  Administered 2022-12-04: 2 [IU] via SUBCUTANEOUS
  Administered 2022-12-04 (×2): 1 [IU] via SUBCUTANEOUS
  Administered 2022-12-05 – 2022-12-06 (×5): 2 [IU] via SUBCUTANEOUS
  Administered 2022-12-06: 1 [IU] via SUBCUTANEOUS
  Administered 2022-12-07: 2 [IU] via SUBCUTANEOUS
  Filled 2022-12-02 (×14): qty 1

## 2022-12-02 MED ORDER — HEPARIN BOLUS VIA INFUSION
3000.0000 [IU] | Freq: Once | INTRAVENOUS | Status: AC
  Administered 2022-12-02: 3000 [IU] via INTRAVENOUS
  Filled 2022-12-02: qty 3000

## 2022-12-02 MED ORDER — SODIUM CHLORIDE 0.9 % IV SOLN: INTRAVENOUS | Status: AC

## 2022-12-02 MED ORDER — INSULIN GLARGINE-YFGN 100 UNIT/ML ~~LOC~~ SOLN
10.0000 [IU] | Freq: Every day | SUBCUTANEOUS | Status: DC
  Administered 2022-12-02 – 2022-12-03 (×2): 10 [IU] via SUBCUTANEOUS
  Filled 2022-12-02 (×2): qty 0.1

## 2022-12-02 MED ORDER — HEPARIN BOLUS VIA INFUSION
2800.0000 [IU] | Freq: Once | INTRAVENOUS | Status: AC
  Administered 2022-12-02: 2800 [IU] via INTRAVENOUS
  Filled 2022-12-02: qty 2800

## 2022-12-02 MED ORDER — SODIUM CHLORIDE 0.9 % IV SOLN
2.0000 g | INTRAVENOUS | Status: DC
  Administered 2022-12-02 – 2022-12-06 (×5): 2 g via INTRAVENOUS
  Filled 2022-12-02 (×5): qty 20

## 2022-12-02 MED ORDER — LACTATED RINGERS IV SOLN: INTRAVENOUS | Status: DC

## 2022-12-02 MED ORDER — INSULIN ASPART 100 UNIT/ML IJ SOLN
0.0000 [IU] | Freq: Every day | INTRAMUSCULAR | Status: DC
  Administered 2022-12-02: 2 [IU] via SUBCUTANEOUS
  Filled 2022-12-02: qty 1

## 2022-12-02 MED ORDER — ACETAMINOPHEN 325 MG RE SUPP: 650.0000 mg | Freq: Four times a day (QID) | RECTAL | Status: DC | PRN

## 2022-12-02 MED ORDER — HALOPERIDOL LACTATE 5 MG/ML IJ SOLN
1.0000 mg | Freq: Four times a day (QID) | INTRAMUSCULAR | Status: DC | PRN
  Administered 2022-12-02: 1 mg via INTRAVENOUS
  Filled 2022-12-02: qty 1

## 2022-12-02 MED ORDER — SODIUM CHLORIDE 0.9 % IV SOLN
2.0000 g | Freq: Two times a day (BID) | INTRAVENOUS | Status: DC
  Administered 2022-12-02: 2 g via INTRAVENOUS
  Filled 2022-12-02 (×2): qty 12.5

## 2022-12-02 MED ORDER — ACETAMINOPHEN 10 MG/ML IV SOLN
1000.0000 mg | Freq: Once | INTRAVENOUS | Status: AC | PRN
  Administered 2022-12-02: 1000 mg via INTRAVENOUS
  Filled 2022-12-02: qty 100

## 2022-12-02 NOTE — Progress Notes (Signed)
       CROSS COVER NOTE  NAME: Alexander Wilcox MRN: [id number] DOB : 01-01-1951    HPI/Events of Note   Report:"I have given this patient morphine for pain, but he remains severely mentally altered. He is talking about things that just don't make sense. He continually takes off his cardiac leads, O2 sensor, and BP cuff. Now he has removed both of his IV's. So I believe the last 15-20 minutes of his fluids and heparin have spilled out on the bed. I am attempting to start new lines, but is there anything we can give him to help him sleep? He is becoming more and more agitated. He will calm down for a minute or two, but then begins reacting to things which are not in the room".       12/02/2022    1:50 AM 12/02/2022   12:00 AM 12/01/2022   11:05 PM  Vitals with BMI  Systolic 150 156 409  Diastolic 73 79 74  Pulse 111 103 98      Assessment and  Interventions   Assessment:Delirium and patient admitted several hours ago with acute encephalopathy secondary to sepsis from UTI as well as acute PE.  Plan: Haldol prn and delirium precautions Fall and aspiration precautions Continue current management per H&P

## 2022-12-02 NOTE — Consult Note (Addendum)
NAME: Alexander Wilcox  DOB: 04-03-1951  MRN: [id number]  Date/Time: 12/02/2022 12:35 PM  REQUESTING PROVIDER : dr Allena Katz REASON FOR CONSULT: complicate UTI  No history available from patient because of metal status Chart reviewed in detail Spoke to wife at bed side ? Alexander Wilcox is a 72 y.o. with a history of Ca prostate, HTN, IDDM, OSA on CPAP, HLD cervical myelopathy s/p cervical fusion, recent hospitalization at [facility] 5/9-5/16 after a fall , fracture T9 with ORIF posterolateral arthrodesis T7-T12  on 11/12/22, bladder neck contracture requiring dilatation and foley catheter on 5/10 when foley could not be placed during the neurosurgery, colonic ileus, acute neurological deficit recovered and imaging no stroke Presents from [facility] with altered mental status- On Sunday they were flushing the catheter because there was no urine from the catheter an dit was clogged by blood clots. was being treated there for UTI with antibiotic On Tuesday he was a bit tired and somnolent and yesterday was out of it and brought to the ED. As per wife he is not able to use the left leg since the fall and fracture and surgery Vitals in the ED  12/01/22 16:33  BP 142/64 !  Temp 97.7 F (36.5 C)  Pulse Rate 106 !  Resp 20  SpO2 96 %    Latest Reference Range & Units 12/01/22 16:35  WBC 4.0 - 10.5 K/uL 13.5 (H)  Hemoglobin 13.0 - 17.0 g/dL 40.9 (L)  HCT 81.1 - 91.4 % 31.3 (L)  Platelets 150 - 400 K/uL 262  Creatinine 0.61 - 1.24 mg/dL 7.82 (H)   UA pyuria CT abdomen showing cystitis CTA multiple segmental and subsegmental filling defects in the rt lower lobe compatible with PE  HE was stated on IV cefepime    Past Medical History:  Diagnosis Date   Arthritis    Diabetes mellitus without complication (HCC)    Hypertension    Prostate cancer (HCC)    Sleep apnea     Past Surgical History:  Procedure Laterality Date   APPENDECTOMY     BACK SURGERY     CYSTOSCOPY  11/12/2022    Procedure: Cystoscopy, dilation of bladder neck contracture, complex Foley catheter placement;  Surgeon: Sondra Come, MD;  Location: [facility] ORS;  Service: Urology;;   KNEE ARTHROPLASTY     PROSTATE SURGERY      Social History   Socioeconomic History   Marital status: Married    Spouse name: Not on file   Number of children: Not on file   Years of education: Not on file   Highest education level: Not on file  Occupational History   Not on file  Tobacco Use   Smoking status: Former    Packs/day: 2.00    Years: 30.00    Additional pack years: 0.00    Total pack years: 60.00    Types: Cigarettes    Quit date: 07/05/1996    Years since quitting: 26.4   Smokeless tobacco: Never  Vaping Use   Vaping Use: Never used  Substance and Sexual Activity   Alcohol use: Yes    Comment: daily    Drug use: Not Currently   Sexual activity: Not on file  Other Topics Concern   Not on file  Social History Narrative   Not on file   Social Determinants of Health   Financial Resource Strain: Not on file  Food Insecurity: No Food Insecurity (11/12/2022)   Hunger Vital Sign    Worried  About Running Out of Food in the Last Year: Never true    Ran Out of Food in the Last Year: Never true  Transportation Needs: No Transportation Needs (11/12/2022)   PRAPARE - Administrator, Civil Service (Medical): No    Lack of Transportation (Non-Medical): No  Physical Activity: Not on file  Stress: Not on file  Social Connections: Not on file  Intimate Partner Violence: Not At Risk (11/12/2022)   Humiliation, Afraid, Rape, and Kick questionnaire    Fear of Current or Ex-Partner: No    Emotionally Abused: No    Physically Abused: No    Sexually Abused: No    Family History  Problem Relation Age of Onset   Diabetes Sister    Diabetes Maternal Grandmother    Lung disease Brother    Kidney failure Sister    Allergies  Allergen Reactions   Dilaudid [Hydromorphone Hcl] Itching    I? Current Facility-Administered Medications  Medication Dose Route Frequency Provider Last Rate Last Admin   0.9 %  sodium chloride infusion   Intravenous Continuous Enedina Finner, MD 100 mL/hr at 12/02/22 0854 New Bag at 12/02/22 0854   acetaminophen (TYLENOL) tablet 650 mg  650 mg Oral Q6H PRN Andris Baumann, MD       Or   acetaminophen (TYLENOL) suppository 650 mg  650 mg Rectal Q6H PRN Andris Baumann, MD       ceFEPIme (MAXIPIME) 2 g in sodium chloride 0.9 % 100 mL IVPB  2 g Intravenous Q12H Otelia Sergeant, RPH   Stopped at 12/02/22 1610   Chlorhexidine Gluconate Cloth 2 % PADS 6 each  6 each Topical Daily Enedina Finner, MD   6 each at 12/02/22 1150   haloperidol lactate (HALDOL) injection 1 mg  1 mg Intravenous Q6H PRN Andris Baumann, MD   1 mg at 12/02/22 0218   heparin ADULT infusion 100 units/mL (25000 units/226mL)  1,850 Units/hr Intravenous Continuous Otelia Sergeant, RPH 18.5 mL/hr at 12/02/22 0933 1,850 Units/hr at 12/02/22 0933   insulin aspart (novoLOG) injection 0-5 Units  0-5 Units Subcutaneous QHS Enedina Finner, MD       insulin aspart (novoLOG) injection 0-9 Units  0-9 Units Subcutaneous TID WC Enedina Finner, MD   5 Units at 12/02/22 1149   insulin glargine-yfgn (SEMGLEE) injection 10 Units  10 Units Subcutaneous Daily Enedina Finner, MD   10 Units at 12/02/22 1150   morphine (PF) 2 MG/ML injection 2 mg  2 mg Intravenous Q2H PRN Andris Baumann, MD   2 mg at 12/02/22 0934   ondansetron (ZOFRAN) tablet 4 mg  4 mg Oral Q6H PRN Andris Baumann, MD       Or   ondansetron [facility]) injection 4 mg  4 mg Intravenous Q6H PRN Andris Baumann, MD       oxyCODONE (Oxy IR/ROXICODONE) immediate release tablet 5 mg  5 mg Oral Q4H PRN Andris Baumann, MD         Abtx:  Anti-infectives (From admission, onward)    Start     Dose/Rate Route Frequency Ordered Stop   12/02/22 0800  ceFEPIme (MAXIPIME) 2 g in sodium chloride 0.9 % 100 mL IVPB        2 g 200 mL/hr over 30 Minutes Intravenous  Every 12 hours 12/02/22 0058 12/08/22 1959   12/01/22 1915  ceFEPIme (MAXIPIME) 2 g in sodium chloride 0.9 % 100 mL IVPB  2 g 200 mL/hr over 30 Minutes Intravenous  Once 12/01/22 1912 12/01/22 2017       REVIEW OF SYSTEMS:  NA Objective:  VITALS:  BP 124/64   Pulse (!) 126   Temp 97.9 F (36.6 C) (Oral)   Resp (!) 25   Ht 5\' 8"  (1.727 m)   Wt 116.1 kg   SpO2 100%   BMI 38.92 kg/m  LDA Foley  PHYSICAL EXAM:  General: somnolent On calling his name he opened his eyes and responded to some questions but goes off to sleep quickly snoring  Head: Normocephalic, without obvious abnormality, atraumatic. Eyes: cannot examine ENT cannot examine Neck:  symmetrical, no adenopathy, thyroid: non tender no carotid bruit and no JVD. Back: thoracic spine surgical scar healed well Lungs: b/la ir entry Heart: tachycardia. Abdomen: Soft, non-tender,not distended. Bowel sounds normal. No masses Foley  Extremities: atraumatic, no cyanosis. No edema. No clubbing Skin: No rashes or lesions. Or bruising Lymph: Cervical, supraclavicular normal. Neurologic: cannot assess Pertinent Labs Lab Results CBC    Component Value Date/Time   WBC 15.4 (H) 12/02/2022 0513   RBC 3.75 (L) 12/02/2022 0513   HGB 10.9 (L) 12/02/2022 0513   HCT 31.2 (L) 12/02/2022 0513   PLT 268 12/02/2022 0513   MCV 83.2 12/02/2022 0513   MCH 29.1 12/02/2022 0513   MCHC 34.9 12/02/2022 0513   RDW 12.8 12/02/2022 0513   LYMPHSABS 2.2 11/11/2022 2152   MONOABS 0.6 11/11/2022 2152   EOSABS 0.2 11/11/2022 2152   BASOSABS 0.1 11/11/2022 2152       Latest Ref Rng & Units 12/02/2022    5:13 AM 12/01/2022    7:33 PM 12/01/2022    4:35 PM  CMP  Glucose 70 - 99 mg/dL 130   865   BUN 8 - 23 mg/dL 33   30   Creatinine 7.84 - 1.24 mg/dL 6.96   2.95   Sodium 284 - 145 mmol/L 124   124   Potassium 3.5 - 5.1 mmol/L 4.3   4.1   Chloride 98 - 111 mmol/L 91   90   CO2 22 - 32 mmol/L 24   24   Calcium 8.9 - 10.3 mg/dL  8.4   8.8   Total Protein 6.5 - 8.1 g/dL  7.7    Total Bilirubin 0.3 - 1.2 mg/dL  1.3    Alkaline Phos 38 - 126 U/L  114    AST 15 - 41 U/L  17    ALT 0 - 44 U/L  26        Microbiology: Recent Results (from the past 240 hour(s))  Blood Culture (routine x 2)     Status: None (Preliminary result)   Collection Time: 12/01/22  7:33 PM   Specimen: BLOOD  Result Value Ref Range Status   Specimen Description BLOOD BLOOD RIGHT ARM  Final   Special Requests   Final    BOTTLES DRAWN AEROBIC AND ANAEROBIC Blood Culture adequate volume   Culture   Final    NO GROWTH < 12 HOURS Performed at [facility], [address] [place], [place] [address]    Report Status PENDING  Incomplete  Blood Culture (routine x 2)     Status: None (Preliminary result)   Collection Time: 12/01/22  7:33 PM   Specimen: BLOOD  Result Value Ref Range Status   Specimen Description BLOOD BLOOD LEFT ARM  Final   Special Requests   Final    BOTTLES DRAWN  AEROBIC AND ANAEROBIC Blood Culture results may not be optimal due to an excessive volume of blood received in culture bottles   Culture   Final    NO GROWTH < 12 HOURS Performed at [facility], [address] [place], [place] [address]    Report Status PENDING  Incomplete    IMAGING RESULTS:  I have personally reviewed the films ?no infiltrate Hardware thoracic spine  Impression/Recommendation  Metabolic encephalopathy- combination of infection, medications, also OSA may be playing a role- need to place CPAP even during day time while he is sleeping  Acute pulmonary embolism with left leg DVT- not hypoxic On heparin  Complicated UTI- recent bladder neck stricture , dilated and foley place don 11/12/22 [facility] some obstruction to foley due o clots an it was flushed out Ecoli in urine culture collected on 11/29/22- susceptible one Change cefepime to ceftriaxone as cefepime can aggravate encephalopathy  AKI  Recent t9 fractrue  due to fall an had surgery- hardware present surgical site clean- heaed well  OSA- has CPAP  HTN    Ca prostate  Discussed the management with wife in detail  ? ? ? ___________________________________________________

## 2022-12-02 NOTE — Progress Notes (Signed)
ANTICOAGULATION CONSULT NOTE  Pharmacy Consult for heparin infusion Indication: pulmonary embolus  Allergies  Allergen Reactions   Dilaudid [Hydromorphone Hcl] Itching    Patient Measurements: Height: 5\' 8"  (172.7 cm) Weight: 116.1 kg (256 lb) IBW/kg (Calculated) : 68.4 Heparin Dosing Weight: 94.7 kg  Vital Signs: Temp: 97.9 F (36.6 C) (05/30 0415) Temp Source: Oral (05/30 0415) BP: 131/63 (05/30 0600) Pulse Rate: 126 (05/30 0600)  Labs: Recent Labs    12/01/22 1635 12/01/22 1933 12/02/22 0513  HGB 10.7*  --  10.9*  HCT 31.3*  --  31.2*  PLT 262  --  268  APTT  --  33  --   LABPROT  --  15.3*  --   INR  --  1.2  --   HEPARINUNFRC  --   --  <0.10*  CREATININE 1.38*  --  1.79*     Estimated Creatinine Clearance: 47.5 mL/min (A) (by C-G formula based on SCr of 1.79 mg/dL (H)).   Medical History: Past Medical History:  Diagnosis Date   Arthritis    Diabetes mellitus without complication (HCC)    Hypertension    Prostate cancer (HCC)    Sleep apnea     Assessment: Pt is a 72 yo male presenting to ED d/t AMS, found with "multiple filling defects within right lower lobe pulmonary arterial branches compatible with multiple pulmonary emboli."  Goal of Therapy:  Heparin level 0.3-0.7 units/ml Monitor platelets by anticoagulation protocol: Yes   05/30 0513 HL < 0.10, subtherapeutic  Plan:  Bolus 2800 units x 1 Increase heparin infusion to 1850 units/hr Will recheck HL in 8 hr after rate change CBC daily while on heparin  Otelia Sergeant, PharmD, [facility] 12/02/2022 6:39 AM

## 2022-12-02 NOTE — ED Notes (Signed)
Attempted to call report to step down. Step down will call back

## 2022-12-02 NOTE — ED Notes (Signed)
US at bedside

## 2022-12-02 NOTE — ED Notes (Signed)
Pt's temp is 102.8, dr. Allena Katz notified. Awaiting orders.

## 2022-12-02 NOTE — Progress Notes (Addendum)
       CROSS COVER NOTE  NAME: Alexander Wilcox MRN: [id number] DOB : 29-Sep-1950    Concern from nurse Eyvonne Mechanic   This patient currently has LR running at 150, and his heparin drip going. He is more tachycardic than he has been with a heart rate of 125 showing on the monitor, and his blood pressure has dropped to 107/63. Do you want [place] to give another bolus of fluid, or would you prefer some other treatment?      Pertinent findings on chart review:    12/02/2022    4:15 AM 12/02/2022    1:50 AM 12/02/2022   12:00 AM  Vitals with BMI  Systolic 107 150 409  Diastolic 63 73 79  Pulse 123 111 103     Assessment and  Interventions   Assessment:  Plan: Will get CXR and lactic and BNP----> results unrevealing Continue current management and continue to monitor X

## 2022-12-02 NOTE — ED Notes (Signed)
Pt. Linens and brief changed. Pericare provided. Moderated soft, formed BM noted. Rectal thermometer probe inserted.

## 2022-12-02 NOTE — Progress Notes (Signed)
Pharmacy Antibiotic Note  Alexander Wilcox is a 73 y.o. male admitted on 12/01/2022 with UTI.  Pharmacy has been consulted for Cefepime dosing for 7 days.  Plan: Cefepime 2 gm q12hr per indication & renal fxn.  Pharmacy will continue to follow and will adjust abx dosing whenever warranted.  Temp (24hrs), Avg:97.8 F (36.6 C), Min:97.7 F (36.5 C), Max:97.9 F (36.6 C)   Recent Labs  Lab 12/01/22 1635 12/01/22 1933 12/01/22 2049  WBC 13.5*  --   --   CREATININE 1.38*  --   --   LATICACIDVEN  --  1.3 1.5    Estimated Creatinine Clearance: 61.6 mL/min (A) (by C-G formula based on SCr of 1.38 mg/dL (H)).    Allergies  Allergen Reactions   Dilaudid [Hydromorphone Hcl] Itching    Antimicrobials this admission: 5/29 Cefepime >>   Microbiology results: 5/29 BCx: Pending 5/29 UCx: Pending   Thank you for allowing pharmacy to be a part of this patient's care.  Otelia Sergeant, PharmD, [facility] 12/02/2022 12:59 AM

## 2022-12-02 NOTE — Progress Notes (Signed)
ANTICOAGULATION CONSULT NOTE  Pharmacy Consult for heparin infusion Indication: pulmonary embolus  Allergies  Allergen Reactions   Dilaudid [Hydromorphone Hcl] Itching    Patient Measurements: Height: 5\' 8"  (172.7 cm) Weight: 116.1 kg (256 lb) IBW/kg (Calculated) : 68.4 Heparin Dosing Weight: 94.7 kg  Vital Signs: Temp: 101.8 F (38.8 C) (05/30 1200) Temp Source: Oral (05/30 0415) BP: 108/62 (05/30 1200) Pulse Rate: 110 (05/30 1200)  Labs: Recent Labs    12/01/22 1635 12/01/22 1933 12/02/22 0513  HGB 10.7*  --  10.9*  HCT 31.3*  --  31.2*  PLT 262  --  268  APTT  --  33  --   LABPROT  --  15.3*  --   INR  --  1.2  --   HEPARINUNFRC  --   --  <0.10*  CREATININE 1.38*  --  1.79*     Estimated Creatinine Clearance: 47.5 mL/min (A) (by C-G formula based on SCr of 1.79 mg/dL (H)).   Medical History: Past Medical History:  Diagnosis Date   Arthritis    Diabetes mellitus without complication (HCC)    Hypertension    Prostate cancer (HCC)    Sleep apnea     Assessment: Pt is a 72 yo male presenting to ED d/t AMS, found with "multiple filling defects within right lower lobe pulmonary arterial branches compatible with multiple pulmonary emboli."  Goal of Therapy:  Heparin level 0.3-0.7 units/ml Monitor platelets by anticoagulation protocol: Yes    Plan: heparin level remains undetectable despite recent rate increase ---Bolus 3000 units x 1 ---Increase heparin infusion to 2250 units/hr ---Will recheck heparin level in 8 hrs after rate change ---CBC daily while on heparin  Alexander Wilcox, PharmD, BCPS 12/02/2022 1:17 PM

## 2022-12-02 NOTE — ED Notes (Signed)
While receiving report at bedside on this pt. This RN noted pt. To be sleeping, tachycardic at 120-130 bpm, and with dark red, cloudy urine in foley bag. Previous shift RN notes that he has not emptied foley bag since placing foley at around midnight. Pt's HR and lack of urine output communicated to Dr. Allena Katz, oncoming MD. Pt. Noted to have dried blood on sheets and tshirt. Previous RN states pt. Pulled out bilateral IV's overnight and he was in too much pain to change. Pt. Is currently sleeping, when he wakes this RN will attempt to refresh pt's linens and clothes.

## 2022-12-02 NOTE — Progress Notes (Signed)
[facility]  - [place] at [facility]   PATIENT NAME: Alexander Wilcox    MR#:  [id number]  DATE OF BIRTH:  [birth year]  SUBJECTIVE:  patient seen in the emergency room earlier. No family present at that time. Spoke with Alexander Wilcox on the phone and updated. Patient apparently came in from rehab with altered mental status/delirium dark urine. Patient has had foley since May10th 2024. Came in at that time with back pain found to have T9 fracture underwent neurosurgery with fusion from T7 to T 12. Patient was discharged with Foley catheter. Wife started noticing confusion not behaving his usual self for last three days but started on some antibiotic at the facility however got worse than came to the emergency room.  Patient appears confused. Picking things in the air. Was able to tell me his name and last name.  VITALS:  Blood pressure 124/64, pulse (!) 126, temperature 97.9 F (36.6 C), temperature source Oral, resp. rate (!) 25, height 5\' 8"  (1.727 m), weight 116.1 kg, SpO2 100 %.  PHYSICAL EXAMINATION:   GENERAL:  72 y.o.-year-old patient with no acute distress. Obese LUNGS: Normal breath sounds bilaterally, no wheezing CARDIOVASCULAR: S1, S2 normal. No murmur tachycardia ABDOMEN: Soft, nontender, nondistended. Bowel sounds present. Foley catheter with dark maroon urine EXTREMITIES: No  edema b/l.    NEUROLOGIC: nonfocal  patient is confused picking things in the air. Not oriented to time place person   LABORATORY PANEL:  CBC Recent Labs  Lab 12/02/22 0513  WBC 15.4*  HGB 10.9*  HCT 31.2*  PLT 268    Chemistries  Recent Labs  Lab 12/01/22 1933 12/02/22 0513  NA  --  124*  K  --  4.3  CL  --  91*  CO2  --  24  GLUCOSE  --  286*  BUN  --  33*  CREATININE  --  1.79*  CALCIUM  --  8.4*  AST 17  --   ALT 26  --   ALKPHOS 114  --   BILITOT 1.3*  --    Cardiac Enzymes No results for input(s): "TROPONINI" in the last 168 hours. RADIOLOGY:  US Venous  Img Lower Bilateral (DVT)  Result Date: 12/02/2022 CLINICAL DATA:  Shortness of breath EXAM: BILATERAL LOWER EXTREMITY VENOUS DOPPLER ULTRASOUND TECHNIQUE: Gray-scale sonography with graded compression, as well as color Doppler and duplex ultrasound were performed to evaluate the lower extremity deep venous systems from the level of the common femoral vein and including the common femoral, femoral, profunda femoral, popliteal and calf veins including the posterior tibial, peroneal and gastrocnemius veins when visible. The superficial great saphenous vein was also interrogated. Spectral Doppler was utilized to evaluate flow at rest and with distal augmentation maneuvers in the common femoral, femoral and popliteal veins. COMPARISON:  None Available. FINDINGS: RIGHT LOWER EXTREMITY Common Femoral Vein: No evidence of thrombus. Normal compressibility, respiratory phasicity and response to augmentation. Saphenofemoral Junction: No evidence of thrombus. Normal compressibility and flow on color Doppler imaging. Profunda Femoral Vein: No evidence of thrombus. Normal compressibility and flow on color Doppler imaging. Femoral Vein: No evidence of thrombus. Normal compressibility, respiratory phasicity and response to augmentation. Popliteal Vein: No evidence of thrombus. Normal compressibility, respiratory phasicity and response to augmentation. Calf Veins: No evidence of thrombus. Normal compressibility and flow on color Doppler imaging. Superficial Great Saphenous Vein: No evidence of thrombus. Normal compressibility. Venous Reflux:  None. Other Findings:  None. LEFT LOWER EXTREMITY Common Femoral Vein: No evidence  of thrombus. Normal compressibility, respiratory phasicity and response to augmentation. Saphenofemoral Junction: No evidence of thrombus. Normal compressibility and flow on color Doppler imaging. Profunda Femoral Vein: No evidence of thrombus. Normal compressibility and flow on color Doppler imaging. Femoral  Vein: No evidence of thrombus. Normal compressibility, respiratory phasicity and response to augmentation. Popliteal Vein: No evidence of thrombus. Normal compressibility, respiratory phasicity and response to augmentation. Calf Veins: There is occlusive thrombus with noncompressibility in one of the paired posterior tibial veins. Superficial Great Saphenous Vein: No evidence of thrombus. Normal compressibility. Venous Reflux:  None. Other Findings:  None. IMPRESSION: 1. Occlusive thrombus with noncompressibility in one of the paired posterior tibial veins on the left. 2. No evidence of DVT on the right. Electronically Signed   By: Lesia Hausen M.D.   On: 12/02/2022 09:52   DG Chest Port 1 View  Result Date: 12/02/2022 CLINICAL DATA:  Dyspnea. EXAM: PORTABLE CHEST 1 VIEW COMPARISON:  CTA chest yesterday at 10:19 p.m. FINDINGS: The heart size and mediastinal contours are within normal limits. Calcification is again noted in the transverse aorta. Both lungs are clear. The visualized skeletal structures are unremarkable apart from multilevel lower thoracic dorsal fusion rods and pedicle screws. IMPRESSION: No acute radiographic findings. Multiple right lower lobe emboli were noted however, on the CTA. Electronically Signed   By: Almira Bar M.D.   On: 12/02/2022 05:33   CT Angio Chest PE W and/or Wo Contrast  Result Date: 12/01/2022 CLINICAL DATA:  Pulmonary embolism (PE) suspected, high prob EXAM: CT ANGIOGRAPHY CHEST WITH CONTRAST TECHNIQUE: Multidetector CT imaging of the chest was performed using the standard protocol during bolus administration of intravenous contrast. Multiplanar CT image reconstructions and MIPs were obtained to evaluate the vascular anatomy. RADIATION DOSE REDUCTION: This exam was performed according to the departmental dose-optimization program which includes automated exposure control, adjustment of the mA and/or kV according to patient size and/or use of iterative reconstruction  technique. CONTRAST:  75mL OMNIPAQUE IOHEXOL 350 MG/ML SOLN COMPARISON:  11/11/2022 FINDINGS: Cardiovascular: Multiple filling defects within right lower lobe pulmonary arterial branches compatible with multiple pulmonary emboli. No definite pulmonary emboli on the left. No evidence of right heart strain. Coronary artery and aortic calcifications. No evidence of aortic aneurysm. Mediastinum/Nodes: No mediastinal, hilar, or axillary adenopathy. Trachea and esophagus are unremarkable. Thyroid unremarkable. Lungs/Pleura: Lungs are clear. No focal airspace opacities or suspicious nodules. No effusions. Upper Abdomen: No acute findings Musculoskeletal: Chest wall soft tissues are unremarkable. No acute bony abnormality. Postoperative changes in the thoracic spine from posterior fusion. Review of the MIP images confirms the above findings. IMPRESSION: Multiple segmental and subsegmental filling defects in the right lower lobe compatible with multiple pulmonary emboli. No evidence of right heart strain. Scattered aortic atherosclerosis and coronary artery disease. These results were called by telephone at the time of interpretation on 12/01/2022 at 10:25 pm to provider [facility] , who verbally acknowledged these results. Electronically Signed   By: Charlett Nose M.D.   On: 12/01/2022 22:27   CT ABDOMEN PELVIS W CONTRAST  Result Date: 12/01/2022 CLINICAL DATA:  Sepsis Abdominal pain, acute, nonlocalized EXAM: CT ABDOMEN AND PELVIS WITH CONTRAST TECHNIQUE: Multidetector CT imaging of the abdomen and pelvis was performed using the standard protocol following bolus administration of intravenous contrast. RADIATION DOSE REDUCTION: This exam was performed according to the departmental dose-optimization program which includes automated exposure control, adjustment of the mA and/or kV according to patient size and/or use of iterative reconstruction technique. CONTRAST:  OMNIPAQUE IOHEXOL 300 MG/ML  SOLN COMPARISON:   None Available. FINDINGS: Lower chest: Filling defects of the right lower lobe pulmonary arteries. No acute abnormality. Hepatobiliary: The liver is enlarged measuring up to 21.5 cm. No focal liver abnormality. Nonspecific hydropic gallbladder. No gallstones, gallbladder wall thickening, or pericholecystic fluid. No biliary dilatation. Pancreas: No focal lesion. Normal pancreatic contour. No surrounding inflammatory changes. No main pancreatic ductal dilatation. Spleen: Normal in size without focal abnormality. Adrenals/Urinary Tract: No adrenal nodule bilaterally. Bilateral kidneys enhance symmetrically. No frank hydroureteronephrosis. Fullness of bilateral collecting systems. No nephroureterolithiasis. Circumferential urinary bladder wall thickening with perivesicular fat stranding. Foley catheter tip terminates within the urinary bladder lumen. Balloon not well visualized but likely inflated within the urinary bladder lumen. No inflated balloon within the urethra. No excretion of intravenous contrast from either kidneys on delayed view. Stomach/Bowel: Stomach is within normal limits. No evidence of bowel wall thickening or dilatation. Status post appendectomy. Vascular/Lymphatic: No abdominal aorta or iliac aneurysm. Severe atherosclerotic plaque of the aorta and its branches. No abdominal, pelvic, or inguinal lymphadenopathy. Reproductive: No mass. Other: No intraperitoneal free fluid. No intraperitoneal free gas. No organized fluid collection. Musculoskeletal: No abdominal wall hernia or abnormality. No suspicious lytic or blastic osseous lesions. No acute displaced fracture. Multilevel severe degenerative changes of the spine. Multilevel severe osseous neural foraminal stenosis. Thoracic spine surgical hardware partially visualized. IMPRESSION: 1. Right lower lobe segmental and subsegmental pulmonary emboli. Recommend CT angiography pulmonary artery for further evaluation. 2. Cystitis. 3. No excretion of  intravenous contrast from either kidneys on delayed view. Correlate with renal function. 4. Hepatomegaly. 5. Multilevel severe degenerative changes of the spine. Multilevel severe osseous neural foraminal stenosis. 6.  Aortic Atherosclerosis (ICD10-I70.0). These results were called by telephone at the time of interpretation on 12/01/2022 at 9:13 pm to provider [facility] , who verbally acknowledged these results. Electronically Signed   By: Tish Frederickson M.D.   On: 12/01/2022 21:14   CT Head Wo Contrast  Result Date: 12/01/2022 CLINICAL DATA:  Altered mental status EXAM: CT HEAD WITHOUT CONTRAST TECHNIQUE: Contiguous axial images were obtained from the base of the skull through the vertex without intravenous contrast. RADIATION DOSE REDUCTION: This exam was performed according to the departmental dose-optimization program which includes automated exposure control, adjustment of the mA and/or kV according to patient size and/or use of iterative reconstruction technique. COMPARISON:  11/12/2022 FINDINGS: Brain: No acute intracranial findings are seen. There are no signs of bleeding within the cranium. Cortical sulci are prominent. There is no focal edema or mass effect. Ventricles are not dilated. Vascular: Unremarkable. Skull: No acute findings are seen. Degenerative changes are noted in upper cervical spine at C1-C2 level. Sinuses/Orbits: No acute findings are seen. Other: None. IMPRESSION: No acute intracranial findings are seen.  Atrophy. Electronically Signed   By: Ernie Avena M.D.   On: 12/01/2022 20:37   DG Chest Port 1 View  Result Date: 12/01/2022 CLINICAL DATA:  History of recent UTI treated with antibiotics, presenting with altered mental status. EXAM: PORTABLE CHEST 1 VIEW COMPARISON:  Nov 11, 2022 FINDINGS: The heart size and mediastinal contours are within normal limits. There is marked severity calcification of the aortic arch. Low lung volumes are noted. There is no evidence of an  acute infiltrate, pleural effusion or pneumothorax. Interval postoperative changes are seen involving the mid to lower thoracic spine and upper lumbar spine, since the prior exam. A chronic deformity is seen involving the neck of the proximal left  humerus. IMPRESSION: 1. Low lung volumes without evidence of acute or active cardiopulmonary disease. 2. Interval postoperative changes within the thoracolumbar spine since the prior study. Electronically Signed   By: Aram Candela M.D.   On: 12/01/2022 20:05    Assessment and Plan    ATZEL NY is a 72 y.o. 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 Patient went to rehab with Foley catheter. He was seen on follow-up with urology on 21st May and decision was to continue Foley catheter for now since patient was getting rehab and be removed on December 04, 2022.  Patient came in with increasing confusion, hematuria, tachycardic and was admitted with sepsis suspected due to UTI in the setting of Foley catheter. Patient was also noted to have multiple pulmonary emboli on CT scan. Currently on IV heparin drip.  CT head nonacute CT abdomen and pelvis showing cystitis and multilevel severe degenerative changes of the spine as well as evidence of pulmonary emboli CTA chest confirms multiple segmental and subsegmental filling defects in the right lower lobe compatible with multiple pulmonary emboli without evidence of right heart strain.  Catheter-associated urinary tract infection (HCC) History of bladder neck dilatation 11/12/2022 with indwelling Foley placed in the OR by dr  Richardo Hanks Prostate cancer on leuprolide Severe Sepsis --Patient presents with weakness, altered mental status in the setting of UTI on outpatient antibiotics.  Has tachycardia, leukocytosis with normal lactic acid.  AKI, altered mental status/metabolic encephalopathy --Continue cefepime given recent instrumentation --Sepsis fluids --Blood culture neg --UC pending --Foley was exchanged in the ED on 12/01/2022 -- infectious disease consultation with Dr. Joylene Draft   Multiple pulmonary emboli [facility]) --Continue heparin infusion --Provoked PE from recent surgery, immobilization --Supplemental oxygen if needed --Incentive spirometry   Acute metabolic encephalopathy/Delirium --Secondary to UTI and PE --CT head nonacute --Neurologic checks --Aspiration precautions   AKI (acute kidney injury) (HCC)/Hyponatremia --Creatinine 1.38 above baseline of 1.0 --Suspecting related to dehydration from poor oral intake --Will hold lisinopril, HCTZ, metformin.  Avoid nephrotoxins -- nephrology consultation with Dr. 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 Marcell Barlow in the ED --Continue multimodal pain control with gabapentin, duloxetine and narcotics as needed once mentation better   Hyponatremia Suspect dehydration from poor oral intake --As above   Uncontrolled type 2 diabetes mellitus with hyperglycemia, with long-term current use of insulin (HCC) --Blood sugar 263 ---Continue basal insulin -Sliding scale insulin coverage   Carotid artery stenosis Possible prior TIA 11/12/2022 -CTA 11/12/22 showed Occluded right vertebral artery ...,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 -Hold aspirin while on systemic anticoagulation - Continue atorvastatin and fenofibrate    Frailty Patient has recent prolonged hospitalization, recent back surgery with chronic comorbidities and current acute illnesses Currently at SNF s/p recent hospitalization from 5/10 to 5/16 PT eval and treat while in-house when mentation improves   OSA on CPAP Continue CPAP   Essential hypertension Continue metoprolol As needed hydralazine Will hold lisinopril HCTZ given hyponatremia and AKI  DVT prophylaxis: Heparin Code status: full Family communication :wife [name] on the phone Consults : ID, neurosurgery, nephrology  Level of care: Stepdown Status is: Inpatient Remains inpatient appropriate because: patient is critically ill sepsis, Encephalopathy, UTI, PE    TOTAL critical TIME TAKING CARE OF THIS PATIENT: 50 minutes.  >50% time spent on counselling and coordination of care  Note: This dictation was prepared with Dragon dictation along with smaller phrase technology. Any transcriptional errors that result from this process are unintentional.  Enedina Finner M.D    [facility]   CC: Primary care physician; Marisue Ivan, MD

## 2022-12-02 NOTE — ED Notes (Signed)
Dr. Patel at bedside 

## 2022-12-03 ENCOUNTER — Inpatient Hospital Stay: Payer: Medicare HMO

## 2022-12-03 DIAGNOSIS — R310 Gross hematuria: Secondary | ICD-10-CM | POA: Diagnosis not present

## 2022-12-03 DIAGNOSIS — B9620 Unspecified Escherichia coli [E. coli] as the cause of diseases classified elsewhere: Secondary | ICD-10-CM

## 2022-12-03 DIAGNOSIS — N320 Bladder-neck obstruction: Secondary | ICD-10-CM | POA: Diagnosis not present

## 2022-12-03 DIAGNOSIS — A419 Sepsis, unspecified organism: Secondary | ICD-10-CM | POA: Diagnosis not present

## 2022-12-03 DIAGNOSIS — E871 Hypo-osmolality and hyponatremia: Secondary | ICD-10-CM

## 2022-12-03 DIAGNOSIS — I2699 Other pulmonary embolism without acute cor pulmonale: Secondary | ICD-10-CM | POA: Diagnosis not present

## 2022-12-03 DIAGNOSIS — N390 Urinary tract infection, site not specified: Secondary | ICD-10-CM | POA: Diagnosis not present

## 2022-12-03 DIAGNOSIS — D649 Anemia, unspecified: Secondary | ICD-10-CM

## 2022-12-03 DIAGNOSIS — I82402 Acute embolism and thrombosis of unspecified deep veins of left lower extremity: Secondary | ICD-10-CM

## 2022-12-03 LAB — GLUCOSE, CAPILLARY
Glucose-Capillary: 157 mg/dL — ABNORMAL HIGH (ref 70–99)
Glucose-Capillary: 228 mg/dL — ABNORMAL HIGH (ref 70–99)
Glucose-Capillary: 231 mg/dL — ABNORMAL HIGH (ref 70–99)
Glucose-Capillary: 176 mg/dL — ABNORMAL HIGH (ref 70–99)

## 2022-12-03 LAB — BASIC METABOLIC PANEL
Anion gap: 14 (ref 5–15)
BUN: 38 mg/dL — ABNORMAL HIGH (ref 8–23)
CO2: 22 mmol/L (ref 22–32)
Calcium: 8.4 mg/dL — ABNORMAL LOW (ref 8.9–10.3)
Chloride: 94 mmol/L — ABNORMAL LOW (ref 98–111)
Creatinine, Ser: 2.1 mg/dL — ABNORMAL HIGH (ref 0.61–1.24)
GFR, Estimated: 33 mL/min — ABNORMAL LOW (ref >60)
Glucose, Bld: 221 mg/dL — ABNORMAL HIGH (ref 70–99)
Potassium: 3.9 mmol/L (ref 3.5–5.1)
Sodium: 130 mmol/L — ABNORMAL LOW (ref 135–145)

## 2022-12-03 LAB — CBC
HCT: 30.2 % — ABNORMAL LOW (ref 39.0–52.0)
Hemoglobin: 10.1 g/dL — ABNORMAL LOW (ref 13.0–17.0)
MCH: 28.3 pg (ref 26.0–34.0)
MCHC: 33.4 g/dL (ref 30.0–36.0)
MCV: 84.6 fL (ref 80.0–100.0)
Platelets: 254 10*3/uL (ref 150–400)
RBC: 3.57 MIL/uL — ABNORMAL LOW (ref 4.22–5.81)
RDW: 13 % (ref 11.5–15.5)
WBC: 14.5 10*3/uL — ABNORMAL HIGH (ref 4.0–10.5)
nRBC: 0 % (ref 0.0–0.2)

## 2022-12-03 LAB — HEMOGLOBIN: Hemoglobin: 9.7 g/dL — ABNORMAL LOW (ref 13.0–17.0)

## 2022-12-03 LAB — TYPE AND SCREEN
ABO/RH(D): B POS
Antibody Screen: NEGATIVE

## 2022-12-03 LAB — CULTURE, BLOOD (ROUTINE X 2)

## 2022-12-03 LAB — C DIFFICILE QUICK SCREEN W PCR REFLEX
C Diff antigen: NEGATIVE
C Diff interpretation: NOT DETECTED
C Diff toxin: NEGATIVE

## 2022-12-03 LAB — HEPARIN LEVEL (UNFRACTIONATED)
Heparin Unfractionated: <0.1 IU/mL — ABNORMAL LOW (ref 0.30–0.70)
Heparin Unfractionated: <0.1 IU/mL — ABNORMAL LOW (ref 0.30–0.70)
Heparin Unfractionated: <0.1 IU/mL — ABNORMAL LOW (ref 0.30–0.70)

## 2022-12-03 MED ORDER — SODIUM CHLORIDE 0.9 % IV BOLUS
500.0000 mL | Freq: Once | INTRAVENOUS | Status: AC
  Administered 2022-12-03: 500 mL via INTRAVENOUS

## 2022-12-03 MED ORDER — ATORVASTATIN CALCIUM 20 MG PO TABS
40.0000 mg | ORAL_TABLET | Freq: Every day | ORAL | Status: DC
  Administered 2022-12-03 – 2022-12-07 (×5): 40 mg via ORAL
  Filled 2022-12-03 (×5): qty 2

## 2022-12-03 MED ORDER — DULOXETINE HCL 30 MG PO CPEP
30.0000 mg | ORAL_CAPSULE | Freq: Every day | ORAL | Status: DC
  Administered 2022-12-03 – 2022-12-07 (×5): 30 mg via ORAL
  Filled 2022-12-03 (×5): qty 1

## 2022-12-03 MED ORDER — HEPARIN BOLUS VIA INFUSION
3000.0000 [IU] | Freq: Once | INTRAVENOUS | Status: DC
  Filled 2022-12-03: qty 3000

## 2022-12-03 MED ORDER — FENOFIBRATE 160 MG PO TABS
160.0000 mg | ORAL_TABLET | Freq: Every day | ORAL | Status: DC
  Administered 2022-12-03 – 2022-12-07 (×5): 160 mg via ORAL
  Filled 2022-12-03 (×5): qty 1

## 2022-12-03 MED ORDER — MORPHINE SULFATE (PF) 2 MG/ML IV SOLN
1.0000 mg | Freq: Four times a day (QID) | INTRAVENOUS | Status: DC | PRN
  Administered 2022-12-04 (×3): 1 mg via INTRAVENOUS
  Filled 2022-12-03 (×4): qty 1

## 2022-12-03 MED ORDER — HEPARIN BOLUS VIA INFUSION
1400.0000 [IU] | Freq: Once | INTRAVENOUS | Status: AC
  Administered 2022-12-03: 1400 [IU] via INTRAVENOUS
  Filled 2022-12-03: qty 1400

## 2022-12-03 MED ORDER — INSULIN GLARGINE-YFGN 100 UNIT/ML ~~LOC~~ SOLN
15.0000 [IU] | Freq: Every day | SUBCUTANEOUS | Status: DC
  Administered 2022-12-04 – 2022-12-06 (×3): 15 [IU] via SUBCUTANEOUS
  Filled 2022-12-03 (×4): qty 0.15

## 2022-12-03 MED ORDER — DOCUSATE SODIUM 100 MG PO CAPS: 100.0000 mg | ORAL_CAPSULE | Freq: Two times a day (BID) | ORAL | Status: DC | PRN

## 2022-12-03 MED ORDER — TAMSULOSIN HCL 0.4 MG PO CAPS
0.4000 mg | ORAL_CAPSULE | Freq: Every day | ORAL | Status: DC
  Administered 2022-12-03 – 2022-12-07 (×5): 0.4 mg via ORAL
  Filled 2022-12-03 (×5): qty 1

## 2022-12-03 MED ORDER — SODIUM CHLORIDE 0.9 % IV SOLN: INTRAVENOUS | Status: DC

## 2022-12-03 MED ORDER — PHENOL 1.4 % MT LIQD: 1.0000 | OROMUCOSAL | Status: DC | PRN

## 2022-12-03 MED ORDER — VITAMIN B-12 1000 MCG PO TABS
1000.0000 ug | ORAL_TABLET | Freq: Every day | ORAL | Status: DC
  Administered 2022-12-03 – 2022-12-07 (×5): 1000 ug via ORAL
  Filled 2022-12-03 (×5): qty 1

## 2022-12-03 NOTE — Progress Notes (Signed)
   12/03/22 1940  BiPAP/CPAP/SIPAP  $ Non-Invasive Home Ventilator  Initial  BiPAP/CPAP/SIPAP Pt Type Adult  BiPAP/CPAP/SIPAP  (home unit)  Mask Type Nasal pillows  Respiratory Rate 15 breaths/min  EPAP 13 cmH2O (per patient, home settings)  FiO2 (%) 21 %  Patient Home Equipment Yes  Safety Check Completed by RT for Home Unit Yes, no issues noted  BiPAP/CPAP /SiPAP Vitals  Pulse Rate 94  Resp 15  SpO2 100 %  MEWS Score/Color  MEWS Score 0  MEWS Score Color Green   Home unit plugged into red outlet, no concerns noted, patient self manages home unit

## 2022-12-03 NOTE — Progress Notes (Signed)
   12/03/22 1300  Spiritual Encounters  Type of Visit Initial  Care provided to: Pt and family  Referral source Chaplain assessment  Reason for visit Religious ritual  OnCall Visit No  Spiritual Framework  Presenting Themes Meaning/purpose/sources of inspiration  Patient Stress Factors Other (Comment)  Family Stress Factors Not reviewed  Interventions  Spiritual Care Interventions Made Established relationship of care and support;Compassionate presence;Prayer  Spiritual Care Plan  Spiritual Care Issues Still Outstanding No further spiritual care needs at this time (see row info)   Patient as for me to reach up and get him a miracle. I told patient I can prayer for him. So I pray for patient and his family while stand in his hospital room. Advise patient and family that a chaplain is always here just let the medical staff know you like to speak to a chaplain.

## 2022-12-03 NOTE — Progress Notes (Signed)
ANTICOAGULATION CONSULT NOTE  Pharmacy Consult for heparin infusion Indication: pulmonary embolus  Allergies  Allergen Reactions   Dilaudid [Hydromorphone Hcl] Itching    Patient Measurements: Height: 5\' 8"  (172.7 cm) Weight: 116.1 kg (256 lb) IBW/kg (Calculated) : 68.4 Heparin Dosing Weight: 94.7 kg  Vital Signs: Temp: 100.1 F (37.8 C) (05/30 1939) BP: 136/63 (05/31 0100) Pulse Rate: 104 (05/31 0100)  Labs: Recent Labs    12/01/22 1635 12/01/22 1933 12/02/22 0513 12/02/22 1452 12/03/22 0210  HGB 10.7*  --  10.9*  --  10.1*  HCT 31.3*  --  31.2*  --  30.2*  PLT 262  --  268  --  254  APTT  --  33  --   --   --   LABPROT  --  15.3*  --   --   --   INR  --  1.2  --   --   --   HEPARINUNFRC  --   --  <0.10* <0.10*  --   CREATININE 1.38*  --  1.79*  --   --      Estimated Creatinine Clearance: 47.5 mL/min (A) (by C-G formula based on SCr of 1.79 mg/dL (H)).   Medical History: Past Medical History:  Diagnosis Date   Arthritis    Diabetes mellitus without complication (HCC)    Hypertension    Prostate cancer (HCC)    Sleep apnea     Assessment: Pt is a 72 yo male presenting to ED d/t AMS, found with "multiple filling defects within right lower lobe pulmonary arterial branches compatible with multiple pulmonary emboli."  Goal of Therapy:  Heparin level 0.3-0.7 units/ml Monitor platelets by anticoagulation protocol: Yes    Plan: heparin level remains undetectable despite recent rate increase ---Per RN: patient is now having hematuria with clots  ---No bolus d/t new hematuria ---Increase heparin infusion to 2550 units/hr ---Will recheck heparin level in 8 hrs after rate change ---CBC daily while on heparin  Otelia Sergeant, PharmD, [facility] 12/03/2022 3:25 AM

## 2022-12-03 NOTE — Progress Notes (Signed)
Patient is unwilling to eat due to fear of having bowel movements in bed, however patient verbalizes feeling more comfortable with bowel movements with the usage of briefs.  Explained to patient and family that we unfortunately do not have briefs and use bed pads as substitute.  Patient and family wish for patient to wear briefs so he will feel more comfortable eating and having bowel movements. Family brought in briefs for the patient and patient verbalizes he will use his own briefs in leu of our bed pads.

## 2022-12-03 NOTE — Progress Notes (Signed)
OT Cancellation Note  Patient Details Name: Alexander Wilcox MRN: [id number] DOB: 1950/12/13   Cancelled Treatment:    Reason Eval/Treat Not Completed: Patient not medically ready. Pt noted to be on IV heparin (< 48 hours at this point). Per discussion between PT and MD, recommendation to hold therapy evaluations today 2/2 IV heparin & soft BP & initiate OT tomorrow.   Arman Filter., MPH, MS, OTR/L ascom [phone] 12/03/22, 11:14 AM

## 2022-12-03 NOTE — Consult Note (Signed)
[facility] CONSULT NOTE    Date: 12/03/2022                  Patient Name:  Alexander Wilcox  MRN: [id number]  DOB: [birth year]  Age / Sex: 72 y.o., male         PCP: Marisue Ivan, MD                 Service Requesting Consult: Hospitalist                 Reason for Consult: Acute kidney injury/hyponatremia            History of Present Illness: Patient is a 72 y.o. male with a PMHx of hypertension, diabetes mellitus type 2 with peripheral neuropathy, obstructive sleep apnea, hyperlipidemia, history of prostate cancer, history of spinal surgery involving cervical and lumbar fusions, history of recent T7-T12 fusion, history of bladder neck stricture, who was admitted to [facility] on 12/01/2022 for evaluation of confusion, hematuria, tachycardia, and suspected urinary tract infection.  History was obtained from the patient's wife primarily.  As above he had recent T-spine injury and required fusion of T7-T12.  He also had recent Foley catheter placement for bladder neck stricture.  CT of the abdomen pelvis demonstrated cystitis.  CT of the chest showed multiple pulmonary emboli.  Medications: Outpatient medications: Medications Prior to Admission  Medication Sig Dispense Refill Last Dose   acetaminophen (TYLENOL) 325 MG tablet Take 650 mg by mouth every 6 (six) hours as needed (acute pain).   prn at prn   aspirin EC 81 MG tablet Take 81 mg by mouth daily.   12/01/2022 at 0900   atorvastatin (LIPITOR) 40 MG tablet Take 40 mg by mouth daily.   12/01/2022 at 0900   cyanocobalamin 1000 MCG tablet Take 1,000 mcg by mouth daily.   12/01/2022 at 0900   diphenhydrAMINE (BENADRYL) 25 MG tablet Take 25 mg by mouth every 8 (eight) hours as needed for itching.   prn at prn   docusate sodium (COLACE) 100 MG capsule Take 100 mg by mouth every 12 (twelve) hours as needed for mild constipation.   prn at prn   doxycycline (VIBRAMYCIN) 100 MG capsule Take 100 mg by mouth 2 (two) times  daily.   12/01/2022 at 0900   DULoxetine (CYMBALTA) 30 MG capsule Take 1 capsule (30 mg total) by mouth daily. 90 capsule 0 12/01/2022 at 0900   fenofibrate 160 MG tablet TAKE 1 TABLET DAILY   12/01/2022 at 0900   gabapentin (NEURONTIN) 800 MG tablet Take 1 tablet (800 mg total) by mouth 3 (three) times daily. 90 tablet 0 12/01/2022 at 1400   insulin glargine-yfgn (SEMGLEE) 100 UNIT/ML injection Inject 0.1 mLs (10 Units total) into the skin at bedtime. 10 mL 11 11/30/2022 at 2100   lisinopril-hydrochlorothiazide (ZESTORETIC) 10-12.5 MG tablet Take 1 tablet by mouth daily.   12/01/2022 at 0900   metFORMIN (GLUCOPHAGE-XR) 500 MG 24 hr tablet Take 1 tablet by mouth 2 (two) times daily. Pt taking one in the morning and one at night   12/01/2022 at 0900   metoprolol succinate (TOPROL-XL) 25 MG 24 hr tablet Take 25 mg by mouth 2 (two) times daily.   12/01/2022 at 0900   nortriptyline (PAMELOR) 50 MG capsule Take 50 mg by mouth at bedtime.   11/30/2022 at 2100   Omeprazole 20 MG TBDD Take 40 mg by mouth daily.   12/01/2022 at 0600   oxyCODONE (OXY IR/ROXICODONE)  5 MG immediate release tablet Take 5 mg by mouth in the morning, at noon, and at bedtime.   12/01/2022 at 1200   Oxycodone HCl 10 MG TABS Take 10 mg by mouth daily.   12/01/2022 at 0000   polyethylene glycol (MIRALAX / GLYCOLAX) 17 g packet Take 17 g by mouth daily as needed.      pregabalin (LYRICA) 75 MG capsule Take 1 capsule (75 mg total) by mouth 2 (two) times daily. 60 capsule 0 12/01/2022 at 0900   tamsulosin (FLOMAX) 0.4 MG CAPS capsule Take 0.4 mg by mouth daily as needed (urinary retention).   12/01/2022 at 0946   Insulin Pen Needle (ULTIGUARD SAFEPACK PEN NEEDLE) 32G X 4 MM MISC Use 1 each once daily      Lancets 30G MISC 1 each.       Current medications: Current Facility-Administered Medications  Medication Dose Route Frequency Provider Last Rate Last Admin   0.9 %  sodium chloride infusion   Intravenous Continuous Breeze, Gery Pray, NP 100  mL/hr at 12/03/22 1900 Infusion Verify at 12/03/22 1900   acetaminophen (TYLENOL) tablet 650 mg  650 mg Oral Q6H PRN Andris Baumann, MD   650 mg at 12/02/22 1753   Or   acetaminophen (TYLENOL) suppository 650 mg  650 mg Rectal Q6H PRN Andris Baumann, MD       atorvastatin (LIPITOR) tablet 40 mg  40 mg Oral Daily Enedina Finner, MD   40 mg at 12/03/22 1246   cefTRIAXone (ROCEPHIN) 2 g in sodium chloride 0.9 % 100 mL IVPB  2 g Intravenous Q24H Lynn Ito, MD 200 mL/hr at 12/03/22 2117 2 g at 12/03/22 2117   Chlorhexidine Gluconate Cloth 2 % PADS 6 each  6 each Topical Daily Enedina Finner, MD   6 each at 12/03/22 1000   cyanocobalamin (VITAMIN B12) tablet 1,000 mcg  1,000 mcg Oral Daily Enedina Finner, MD   1,000 mcg at 12/03/22 1246   DULoxetine (CYMBALTA) DR capsule 30 mg  30 mg Oral Daily Enedina Finner, MD   30 mg at 12/03/22 1246   fenofibrate tablet 160 mg  160 mg Oral Daily Enedina Finner, MD   160 mg at 12/03/22 1712   heparin ADULT infusion 100 units/mL (25000 units/256mL)  2,800 Units/hr Intravenous Continuous Angelique Blonder, RPH 28 mL/hr at 12/03/22 1928 2,800 Units/hr at 12/03/22 1928   insulin aspart (novoLOG) injection 0-5 Units  0-5 Units Subcutaneous QHS Enedina Finner, MD   2 Units at 12/02/22 2129   insulin aspart (novoLOG) injection 0-9 Units  0-9 Units Subcutaneous TID WC Enedina Finner, MD   3 Units at 12/03/22 1623   [START ON 12/04/2022] insulin glargine-yfgn (SEMGLEE) injection 15 Units  15 Units Subcutaneous Daily Enedina Finner, MD       morphine (PF) 2 MG/ML injection 1 mg  1 mg Intravenous Q6H PRN Enedina Finner, MD       ondansetron [facility]) tablet 4 mg  4 mg Oral Q6H PRN Andris Baumann, MD       Or   ondansetron [facility]) injection 4 mg  4 mg Intravenous Q6H PRN Andris Baumann, MD       oxyCODONE (Oxy IR/ROXICODONE) immediate release tablet 5 mg  5 mg Oral Q4H PRN Andris Baumann, MD   5 mg at 12/03/22 1925   phenol (CHLORASEPTIC) mouth spray 1 spray  1 spray Mouth/Throat PRN  Enedina Finner, MD       tamsulosin (FLOMAX) capsule 0.4 mg  0.4 mg Oral Daily Enedina Finner, MD   0.4 mg at 12/03/22 1246      Allergies: Allergies  Allergen Reactions   Dilaudid [Hydromorphone Hcl] Itching      Past Medical History: Past Medical History:  Diagnosis Date   Arthritis    Diabetes mellitus without complication (HCC)    Hypertension    Prostate cancer (HCC)    Sleep apnea      Past Surgical History: Past Surgical History:  Procedure Laterality Date   APPENDECTOMY     BACK SURGERY     CYSTOSCOPY  11/12/2022   Procedure: Cystoscopy, dilation of bladder neck contracture, complex Foley catheter placement;  Surgeon: Sondra Come, MD;  Location: [facility] ORS;  Service: Urology;;   KNEE ARTHROPLASTY     PROSTATE SURGERY       Family History: Family History  Problem Relation Age of Onset   Diabetes Sister    Diabetes Maternal Grandmother    Lung disease Brother    Kidney failure Sister      Social History: Social History   Socioeconomic History   Marital status: Married    Spouse name: Not on file   Number of children: Not on file   Years of education: Not on file   Highest education level: Not on file  Occupational History   Not on file  Tobacco Use   Smoking status: Former    Packs/day: 2.00    Years: 30.00    Additional pack years: 0.00    Total pack years: 60.00    Types: Cigarettes    Quit date: 07/05/1996    Years since quitting: 26.4   Smokeless tobacco: Never  Vaping Use   Vaping Use: Never used  Substance and Sexual Activity   Alcohol use: Yes    Comment: daily    Drug use: Not Currently   Sexual activity: Not on file  Other Topics Concern   Not on file  Social History Narrative   Not on file   Social Determinants of Health   Financial Resource Strain: Not on file  Food Insecurity: No Food Insecurity (11/12/2022)   Hunger Vital Sign    Worried About Running Out of Food in the Last Year: Never true    Ran Out of Food in the Last  Year: Never true  Transportation Needs: No Transportation Needs (11/12/2022)   PRAPARE - Administrator, Civil Service (Medical): No    Lack of Transportation (Non-Medical): No  Physical Activity: Not on file  Stress: Not on file  Social Connections: Not on file  Intimate Partner Violence: Not At Risk (11/12/2022)   Humiliation, Afraid, Rape, and Kick questionnaire    Fear of Current or Ex-Partner: No    Emotionally Abused: No    Physically Abused: No    Sexually Abused: No     Review of Systems: Patient unable to provide secondary to confusion.  Vital Signs: Blood pressure (!) 131/54, pulse 92, temperature 97.6 F (36.4 C), temperature source Oral, resp. rate 15, height 5\' 8"  (1.727 m), weight 113.5 kg, SpO2 96 %.  Weight trends: Filed Weights   12/01/22 1633 12/03/22 0332  Weight: 116.1 kg 113.5 kg     Physical Exam: General: No acute distress  Head: Normocephalic, atraumatic. Moist oral mucosal membranes  Eyes: Anicteric  Neck: Supple  Lungs:  Basilar rales  Heart: S1S2 no rubs  Abdomen:  Soft, nontender, bowel sounds present  Extremities: Trace peripheral edema.  Neurologic: Arousable but  confused  Skin: No acute rash  Access: No hemodialysis access    Lab results: Basic Metabolic Panel: Recent Labs  Lab 12/01/22 1635 12/02/22 0513 12/03/22 0210  NA 124* 124* 130*  K 4.1 4.3 3.9  CL 90* 91* 94*  CO2 24 24 22   GLUCOSE 263* 286* 221*  BUN 30* 33* 38*  CREATININE 1.38* 1.79* 2.10*  CALCIUM 8.8* 8.4* 8.4*    Liver Function Tests: Recent Labs  Lab 12/01/22 1933  AST 17  ALT 26  ALKPHOS 114  BILITOT 1.3*  PROT 7.7  ALBUMIN 3.5   No results for input(s): "LIPASE", "AMYLASE" in the last 168 hours. No results for input(s): "AMMONIA" in the last 168 hours.  CBC: Recent Labs  Lab 12/01/22 1635 12/02/22 0513 12/03/22 0210 12/03/22 0720  WBC 13.5* 15.4* 14.5*  --   HGB 10.7* 10.9* 10.1* 9.7*  HCT 31.3* 31.2* 30.2*  --   MCV 84.6  83.2 84.6  --   PLT 262 268 254  --     Cardiac Enzymes: No results for input(s): "CKTOTAL", "CKMB", "CKMBINDEX", "TROPONINI" in the last 168 hours.  BNP: Invalid input(s): "POCBNP"  CBG: Recent Labs  Lab 12/02/22 2126 12/03/22 0737 12/03/22 1120 12/03/22 1613 12/03/22 2115  GLUCAP 248* 157* 228* 231* 176*    Microbiology: Results for orders placed or performed during the hospital encounter of 12/01/22  Blood Culture (routine x 2)     Status: None (Preliminary result)   Collection Time: 12/01/22  7:33 PM   Specimen: BLOOD  Result Value Ref Range Status   Specimen Description BLOOD BLOOD RIGHT ARM  Final   Special Requests   Final    BOTTLES DRAWN AEROBIC AND ANAEROBIC Blood Culture adequate volume   Culture   Final    NO GROWTH 2 DAYS Performed at [facility], [address] [place], [place] [address]    Report Status PENDING  Incomplete  Blood Culture (routine x 2)     Status: None (Preliminary result)   Collection Time: 12/01/22  7:33 PM   Specimen: BLOOD  Result Value Ref Range Status   Specimen Description BLOOD BLOOD LEFT ARM  Final   Special Requests   Final    BOTTLES DRAWN AEROBIC AND ANAEROBIC Blood Culture results may not be optimal due to an excessive volume of blood received in culture bottles   Culture   Final    NO GROWTH 2 DAYS Performed at [facility], [address], [place], [place] [address]    Report Status PENDING  Incomplete  Remove and replace urinary cath (placed > 5 days) then obtain urine culture from new indwelling urinary catheter.     Status: Abnormal (Preliminary result)   Collection Time: 12/01/22  7:33 PM   Specimen: Urine, Catheterized  Result Value Ref Range Status   Specimen Description   Final    URINE, CATHETERIZED Performed at [facility], [address] [place], [place] [address]    Special Requests   Final    NONE Performed at [facility], [address] [place], [place]  [address]    Culture >=100,000 COLONIES/mL ESCHERICHIA COLI (A)  Final   Report Status PENDING  Incomplete  MRSA Next Gen by PCR, Nasal     Status: None   Collection Time: 12/02/22  8:42 AM   Specimen: Nasal Mucosa; Nasal Swab  Result Value Ref Range Status   MRSA by PCR Next Gen NOT DETECTED NOT DETECTED Final  Comment: (NOTE) The GeneXpert MRSA Assay (FDA approved for NASAL specimens only), is one component of a comprehensive MRSA colonization surveillance program. It is not intended to diagnose MRSA infection nor to guide or monitor treatment for MRSA infections. Test performance is not FDA approved in patients less than 67 years old. Performed at [facility], [address] [place], [place] [address]   C Difficile Quick Screen w PCR reflex     Status: None   Collection Time: 12/03/22 12:52 PM   Specimen: STOOL  Result Value Ref Range Status   C Diff antigen NEGATIVE NEGATIVE Final   C Diff toxin NEGATIVE NEGATIVE Final   C Diff interpretation No C. difficile detected.  Final    Comment: Performed at [facility], [address] [place], [place] [address]    Coagulation Studies: Recent Labs    12/01/22 1933  LABPROT 15.3*  INR 1.2    Urinalysis: Recent Labs    12/01/22 1933  COLORURINE AMBER*  LABSPEC 1.010  PHURINE 5.0  GLUCOSEU 150*  HGBUR LARGE*  BILIRUBINUR NEGATIVE  KETONESUR NEGATIVE  PROTEINUR 30*  NITRITE NEGATIVE  LEUKOCYTESUR LARGE*      Imaging: US RENAL  Result Date: 12/03/2022 CLINICAL DATA:  [id number] Acute kidney failure (HCC) [id number] EXAM: RENAL / URINARY TRACT ULTRASOUND COMPLETE COMPARISON:  CT 12/01/2022 FINDINGS: Right Kidney: Renal measurements: 15.0 x 6.3 x 5.5 cm = volume: 267.6 mL. Mild pelviectasis, decreased from prior CT. Normal echogenicity. There is a 2.2 cm cystic lesion in the upper pole, poorly visualized. Left Kidney: Renal measurements: 13.1 x 6.5 x 6.1 cm = volume: 271.8 mL. Mild pelviectasis, decreased  from prior CT. Normal echogenicity. Bladder: Decompressed with Foley catheter in place. Other: Hepatic steatosis. IMPRESSION: Mild renal pelviectasis bilaterally. Bladder is decompressed with Foley catheter in place. Electronically Signed   By: Caprice Renshaw M.D.   On: 12/03/2022 11:58   US Venous Img Lower Bilateral (DVT)  Result Date: 12/02/2022 CLINICAL DATA:  Shortness of breath EXAM: BILATERAL LOWER EXTREMITY VENOUS DOPPLER ULTRASOUND TECHNIQUE: Gray-scale sonography with graded compression, as well as color Doppler and duplex ultrasound were performed to evaluate the lower extremity deep venous systems from the level of the common femoral vein and including the common femoral, femoral, profunda femoral, popliteal and calf veins including the posterior tibial, peroneal and gastrocnemius veins when visible. The superficial great saphenous vein was also interrogated. Spectral Doppler was utilized to evaluate flow at rest and with distal augmentation maneuvers in the common femoral, femoral and popliteal veins. COMPARISON:  None Available. FINDINGS: RIGHT LOWER EXTREMITY Common Femoral Vein: No evidence of thrombus. Normal compressibility, respiratory phasicity and response to augmentation. Saphenofemoral Junction: No evidence of thrombus. Normal compressibility and flow on color Doppler imaging. Profunda Femoral Vein: No evidence of thrombus. Normal compressibility and flow on color Doppler imaging. Femoral Vein: No evidence of thrombus. Normal compressibility, respiratory phasicity and response to augmentation. Popliteal Vein: No evidence of thrombus. Normal compressibility, respiratory phasicity and response to augmentation. Calf Veins: No evidence of thrombus. Normal compressibility and flow on color Doppler imaging. Superficial Great Saphenous Vein: No evidence of thrombus. Normal compressibility. Venous Reflux:  None. Other Findings:  None. LEFT LOWER EXTREMITY Common Femoral Vein: No evidence of thrombus.  Normal compressibility, respiratory phasicity and response to augmentation. Saphenofemoral Junction: No evidence of thrombus. Normal compressibility and flow on color Doppler imaging. Profunda Femoral Vein: No evidence of thrombus. Normal compressibility and flow on color Doppler imaging. Femoral Vein: No evidence of  thrombus. Normal compressibility, respiratory phasicity and response to augmentation. Popliteal Vein: No evidence of thrombus. Normal compressibility, respiratory phasicity and response to augmentation. Calf Veins: There is occlusive thrombus with noncompressibility in one of the paired posterior tibial veins. Superficial Great Saphenous Vein: No evidence of thrombus. Normal compressibility. Venous Reflux:  None. Other Findings:  None. IMPRESSION: 1. Occlusive thrombus with noncompressibility in one of the paired posterior tibial veins on the left. 2. No evidence of DVT on the right. Electronically Signed   By: Lesia Hausen M.D.   On: 12/02/2022 09:52   DG Chest Port 1 View  Result Date: 12/02/2022 CLINICAL DATA:  Dyspnea. EXAM: PORTABLE CHEST 1 VIEW COMPARISON:  CTA chest yesterday at 10:19 p.m. FINDINGS: The heart size and mediastinal contours are within normal limits. Calcification is again noted in the transverse aorta. Both lungs are clear. The visualized skeletal structures are unremarkable apart from multilevel lower thoracic dorsal fusion rods and pedicle screws. IMPRESSION: No acute radiographic findings. Multiple right lower lobe emboli were noted however, on the CTA. Electronically Signed   By: Almira Bar M.D.   On: 12/02/2022 05:33   CT Angio Chest PE W and/or Wo Contrast  Result Date: 12/01/2022 CLINICAL DATA:  Pulmonary embolism (PE) suspected, high prob EXAM: CT ANGIOGRAPHY CHEST WITH CONTRAST TECHNIQUE: Multidetector CT imaging of the chest was performed using the standard protocol during bolus administration of intravenous contrast. Multiplanar CT image reconstructions and  MIPs were obtained to evaluate the vascular anatomy. RADIATION DOSE REDUCTION: This exam was performed according to the departmental dose-optimization program which includes automated exposure control, adjustment of the mA and/or kV according to patient size and/or use of iterative reconstruction technique. CONTRAST:  75mL OMNIPAQUE IOHEXOL 350 MG/ML SOLN COMPARISON:  11/11/2022 FINDINGS: Cardiovascular: Multiple filling defects within right lower lobe pulmonary arterial branches compatible with multiple pulmonary emboli. No definite pulmonary emboli on the left. No evidence of right heart strain. Coronary artery and aortic calcifications. No evidence of aortic aneurysm. Mediastinum/Nodes: No mediastinal, hilar, or axillary adenopathy. Trachea and esophagus are unremarkable. Thyroid unremarkable. Lungs/Pleura: Lungs are clear. No focal airspace opacities or suspicious nodules. No effusions. Upper Abdomen: No acute findings Musculoskeletal: Chest wall soft tissues are unremarkable. No acute bony abnormality. Postoperative changes in the thoracic spine from posterior fusion. Review of the MIP images confirms the above findings. IMPRESSION: Multiple segmental and subsegmental filling defects in the right lower lobe compatible with multiple pulmonary emboli. No evidence of right heart strain. Scattered aortic atherosclerosis and coronary artery disease. These results were called by telephone at the time of interpretation on 12/01/2022 at 10:25 pm to provider [facility] , who verbally acknowledged these results. Electronically Signed   By: Charlett Nose M.D.   On: 12/01/2022 22:27     Assessment & Plan: Pt is a 72 y.o. male with a PMHx of hypertension, diabetes mellitus type 2 with peripheral neuropathy, obstructive sleep apnea, hyperlipidemia, history of prostate cancer, history of spinal surgery involving cervical and lumbar fusions, history of recent T7-T12 fusion, history of bladder neck stricture, who was  admitted to [facility] on 12/01/2022 for evaluation of confusion, hematuria, tachycardia, and suspected urinary tract infection.  1.  Acute kidney injury secondary to sepsis and contrast exposure.  The patient's creatinine is rising.  Currently creatinine up to 2.10.  Patient has been started on 0.9 normal saline at 100 cc/h.  Patient also has history of bladder neck stricture but has Foley catheter in place.  Urology following.  Continue supportive  care for now and avoid further nephrotoxin administration.  No acute indication for dialysis but this may be a possibility if renal function continues to worsen.  2.  Hyponatremia.  Was on HCTZ previously.  Continue 0.9 normal saline for now.  There could be some component of SIADH as well.  3.  Urinary tract infection.  Patient has Foley catheter in place.  He also has history of bladder extractor requiring dilatation on 11/12/2022.  Antibiotic management as per infectious disease.  4.  Thank for consultation.

## 2022-12-03 NOTE — Progress Notes (Signed)
ANTICOAGULATION CONSULT NOTE  Pharmacy Consult for heparin infusion Indication: pulmonary embolus  Allergies  Allergen Reactions   Dilaudid [Hydromorphone Hcl] Itching    Patient Measurements: Height: 5\' 8"  (172.7 cm) Weight: 113.5 kg (250 lb 3.6 oz) IBW/kg (Calculated) : 68.4 Heparin Dosing Weight: 94.7 kg  Vital Signs: Temp: 97.6 F (36.4 C) (05/31 1600) Temp Source: Oral (05/31 1600) BP: 120/67 (05/31 1800) Pulse Rate: 95 (05/31 1800)  Labs: Recent Labs    12/01/22 1635 12/01/22 1933 12/02/22 0513 12/02/22 1452 12/03/22 0210 12/03/22 0720 12/03/22 1336 12/03/22 1505  HGB 10.7*  --  10.9*  --  10.1* 9.7*  --   --   HCT 31.3*  --  31.2*  --  30.2*  --   --   --   PLT 262  --  268  --  254  --   --   --   APTT  --  33  --   --   --   --   --   --   LABPROT  --  15.3*  --   --   --   --   --   --   INR  --  1.2  --   --   --   --   --   --   HEPARINUNFRC  --   --  <0.10*   < > <0.10*  --  <0.10* <0.10*  CREATININE 1.38*  --  1.79*  --  2.10*  --   --   --    < > = values in this interval not displayed.     Estimated Creatinine Clearance: 40 mL/min (A) (by C-G formula based on SCr of 2.1 mg/dL (H)).   Medical History: Past Medical History:  Diagnosis Date   Arthritis    Diabetes mellitus without complication (HCC)    Hypertension    Prostate cancer (HCC)    Sleep apnea     Assessment: Pt is a 72 yo male presenting to ED d/t AMS, found with "multiple filling defects within right lower lobe pulmonary arterial branches compatible with multiple pulmonary emboli."  5/31 0210 HL <0.10 Subtherapeutic inc to 2550 u/hr 5/31 1336 HL <0.10 Subtherapeutic-  recheck 5/31 1505 HL <0.10 Subtherapeutic, will order bolus of 1400 units and increase to 2800 units/hr  Goal of Therapy:  Heparin level 0.3-0.7 units/ml Monitor platelets by anticoagulation protocol: Yes    Plan:  5/31 1505 HL <0.10 Subtherapeutic, - will order bolus of 1400 units and increase rate to  2800 units/hr heparin level remains undetectable despite recent rate increase, again? Discussed with RN to check lines, make sure bag is infusing, and f/u with lab on appropriate protocol for drawing a heparin level --hematuria seems to have resolved? ---will give 1/2 bolus due to previous hematuria,  of 1400 units x 1 ---Increase heparin infusion to 2800 units/hr ---Will recheck heparin level in 6 hrs after rate change- a little early ---CBC daily while on heparin  Bari Mantis PharmD Clinical Pharmacist 12/03/2022

## 2022-12-03 NOTE — Plan of Care (Signed)
Continuing with plan of care. 

## 2022-12-03 NOTE — Progress Notes (Addendum)
       CROSS COVER NOTE  NAME: Alexander Wilcox MRN: [id number] DOB : 1951-06-23    Concern from nurse Eyvonne Mechanic   this patient is having some decent size blood clots in his foley. could I possible get an order for manual irrigation to see if there are more clots and hopefully improve flow .[name]... lots of clots came out. not sure if its already in plan, but he may need to follow with urology given history of prostate CA and now clots. HGB seems to be stable at this time.       Pertinent findings on chart review: Patient currently on heparin infusion for multiple PE, segmental and subsegmental History of bladder neck dilatation 11/12/2022 History of prostate cancer    12/03/2022    4:00 AM 12/03/2022    3:32 AM 12/03/2022    3:00 AM  Vitals with BMI  Weight  250 lbs 4 oz   BMI  38.05   Systolic 103  110  Diastolic 44  48  Pulse 111  140     Assessment and  Interventions   Assessment: Gross hematuria with clots, help with manual irrigation Plan: Serial H&H and transfuse if needed Type and screen Urology consult, might need CBI Continue heparin infusion for now monitor H&H IV fluids bolus for soft BP Continue to monitor

## 2022-12-03 NOTE — Progress Notes (Signed)
PT Cancellation Note  Patient Details Name: Alexander Wilcox MRN: [id number] DOB: 08-25-1950   Cancelled Treatment:    Reason Eval/Treat Not Completed: Patient not medically ready PT orders received, chart reviewed. Pt noted to be on IV heparin (< 48 hours at this point), discussed with MD who recommends hold PT evaluation today 2/2 IV heparin & soft BP & initiate PT tomorrow.  Aleda Grana, PT, DPT 12/03/22, 11:01 AM   Sandi Mariscal 12/03/2022, 11:01 AM

## 2022-12-03 NOTE — Consult Note (Signed)
Urology Consult  I have been asked to see the patient by Dr. Allena Katz, for evaluation and management of gross hematuria.  Chief Complaint: Sepsis of urinary source  History of Present Illness: Alexander Wilcox is a 72 y.o. year old male with PMH DM2 and prostate cancer s/p EBRT on leuprolide and a recent history of T7-T12 fusion who underwent intraoperative bladder neck dilation and complicated Foley placement with Dr. Richardo Hanks admitted on 12/01/2022 of urinary source, multiple provoked PEs on heparin, and AKI.  Foley catheter was exchanged in the emergency department and he has had gross hematuria with clots overnight.  CTAP with contrast on admission showed fullness of the bilateral collecting systems without hydronephrosis and circumferential bladder wall thickening with perivesical stranding and a Foley catheter in place.  AM labs notable for stable hemoglobin, 9.7.  Urine culture growing gram-negative rods, speciation pending.  Blood cultures with no growth at 2 days.  On antibiotics as below.  He is accompanied today by his wife and daughter at the bedside.  They report extensive bladder irrigation overnight by his nurse, who cleared a significant amount of gross hematuria and clots.  18 French Foley catheter in place draining clear, yellow urine on arrival.  Anti-infectives (From admission, onward)    Start     Dose/Rate Route Frequency Ordered Stop   12/02/22 2200  cefTRIAXone (ROCEPHIN) 2 g in sodium chloride 0.9 % 100 mL IVPB        2 g 200 mL/hr over 30 Minutes Intravenous Every 24 hours 12/02/22 1640     12/02/22 0800  ceFEPIme (MAXIPIME) 2 g in sodium chloride 0.9 % 100 mL IVPB  Status:  Discontinued        2 g 200 mL/hr over 30 Minutes Intravenous Every 12 hours 12/02/22 0058 12/02/22 1640   12/01/22 1915  ceFEPIme (MAXIPIME) 2 g in sodium chloride 0.9 % 100 mL IVPB        2 g 200 mL/hr over 30 Minutes Intravenous  Once 12/01/22 1912 12/01/22 2017        Past Medical  History:  Diagnosis Date   Arthritis    Diabetes mellitus without complication (HCC)    Hypertension    Prostate cancer (HCC)    Sleep apnea     Past Surgical History:  Procedure Laterality Date   APPENDECTOMY     BACK SURGERY     CYSTOSCOPY  11/12/2022   Procedure: Cystoscopy, dilation of bladder neck contracture, complex Foley catheter placement;  Surgeon: Sondra Come, MD;  Location: [facility] ORS;  Service: Urology;;   KNEE ARTHROPLASTY     PROSTATE SURGERY      Home Medications:  Current Meds  Medication Sig   acetaminophen (TYLENOL) 325 MG tablet Take 650 mg by mouth every 6 (six) hours as needed (acute pain).   aspirin EC 81 MG tablet Take 81 mg by mouth daily.   atorvastatin (LIPITOR) 40 MG tablet Take 40 mg by mouth daily.   cyanocobalamin 1000 MCG tablet Take 1,000 mcg by mouth daily.   diphenhydrAMINE (BENADRYL) 25 MG tablet Take 25 mg by mouth every 8 (eight) hours as needed for itching.   docusate sodium (COLACE) 100 MG capsule Take 100 mg by mouth every 12 (twelve) hours as needed for mild constipation.   doxycycline (VIBRAMYCIN) 100 MG capsule Take 100 mg by mouth 2 (two) times daily.   DULoxetine (CYMBALTA) 30 MG capsule Take 1 capsule (30 mg total) by mouth daily.   fenofibrate 160  MG tablet TAKE 1 TABLET DAILY   gabapentin (NEURONTIN) 800 MG tablet Take 1 tablet (800 mg total) by mouth 3 (three) times daily.   insulin glargine-yfgn (SEMGLEE) 100 UNIT/ML injection Inject 0.1 mLs (10 Units total) into the skin at bedtime.   lisinopril-hydrochlorothiazide (ZESTORETIC) 10-12.5 MG tablet Take 1 tablet by mouth daily.   metFORMIN (GLUCOPHAGE-XR) 500 MG 24 hr tablet Take 1 tablet by mouth 2 (two) times daily. Pt taking one in the morning and one at night   metoprolol succinate (TOPROL-XL) 25 MG 24 hr tablet Take 25 mg by mouth 2 (two) times daily.   nortriptyline (PAMELOR) 50 MG capsule Take 50 mg by mouth at bedtime.   Omeprazole 20 MG TBDD Take 40 mg by mouth daily.    oxyCODONE (OXY IR/ROXICODONE) 5 MG immediate release tablet Take 5 mg by mouth in the morning, at noon, and at bedtime.   Oxycodone HCl 10 MG TABS Take 10 mg by mouth daily.   polyethylene glycol (MIRALAX / GLYCOLAX) 17 g packet Take 17 g by mouth daily as needed.   pregabalin (LYRICA) 75 MG capsule Take 1 capsule (75 mg total) by mouth 2 (two) times daily.   tamsulosin (FLOMAX) 0.4 MG CAPS capsule Take 0.4 mg by mouth daily as needed (urinary retention).    Allergies:  Allergies  Allergen Reactions   Dilaudid [Hydromorphone Hcl] Itching    Family History  Problem Relation Age of Onset   Diabetes Sister    Diabetes Maternal Grandmother    Lung disease Brother    Kidney failure Sister     Social History:  reports that he quit smoking about 26 years ago. His smoking use included cigarettes. He has a 60.00 pack-year smoking history. He has never used smokeless tobacco. He reports current alcohol use. He reports that he does not currently use drugs.  ROS: A complete review of systems was performed.  All systems are negative except for pertinent findings as noted.  Physical Exam:  Vital signs in last 24 hours: Temp:  [97.9 F (36.6 C)-102.4 F (39.1 C)] 97.9 F (36.6 C) (05/31 0800) Pulse Rate:  [96-140] 109 (05/31 0800) Resp:  [13-25] 15 (05/31 0800) BP: (89-155)/(40-92) 100/48 (05/31 0800) SpO2:  [90 %-100 %] 97 % (05/31 0800) FiO2 (%):  [21 %] 21 % (05/30 2257) Weight:  [113.5 kg] 113.5 kg (05/31 0332) Constitutional:  Alert and oriented, no acute distress HEENT: [place] AT, moist mucus membranes Cardiovascular: No clubbing, cyanosis, or edema Respiratory: Normal respiratory effort Skin: No rashes, bruises or suspicious lesions Neurologic: Grossly intact, no focal deficits, moving all 4 extremities Psychiatric: Normal mood and affect  Laboratory Data:  Recent Labs    12/01/22 1635 12/02/22 0513 12/03/22 0210 12/03/22 0720  WBC 13.5* 15.4* 14.5*  --   HGB 10.7* 10.9*  10.1* 9.7*  HCT 31.3* 31.2* 30.2*  --    Recent Labs    12/01/22 1635 12/02/22 0513  NA 124* 124*  K 4.1 4.3  CL 90* 91*  CO2 24 24  GLUCOSE 263* 286*  BUN 30* 33*  CREATININE 1.38* 1.79*  CALCIUM 8.8* 8.4*   Recent Labs    12/01/22 1933  INR 1.2   Urinalysis    Component Value Date/Time   COLORURINE AMBER (A) 12/01/2022 1933   APPEARANCEUR TURBID (A) 12/01/2022 1933   LABSPEC 1.010 12/01/2022 1933   PHURINE 5.0 12/01/2022 1933   GLUCOSEU 150 (A) 12/01/2022 1933   HGBUR LARGE (A) 12/01/2022 1933   BILIRUBINUR  NEGATIVE 12/01/2022 1933   KETONESUR NEGATIVE 12/01/2022 1933   PROTEINUR 30 (A) 12/01/2022 1933   UROBILINOGEN 0.2 06/10/2008 0902   NITRITE NEGATIVE 12/01/2022 1933   LEUKOCYTESUR LARGE (A) 12/01/2022 1933   Results for orders placed or performed during the hospital encounter of 12/01/22  Blood Culture (routine x 2)     Status: None (Preliminary result)   Collection Time: 12/01/22  7:33 PM   Specimen: BLOOD  Result Value Ref Range Status   Specimen Description BLOOD BLOOD RIGHT ARM  Final   Special Requests   Final    BOTTLES DRAWN AEROBIC AND ANAEROBIC Blood Culture adequate volume   Culture   Final    NO GROWTH 2 DAYS Performed at [facility], [address] [place], [place] [address]    Report Status PENDING  Incomplete  Blood Culture (routine x 2)     Status: None (Preliminary result)   Collection Time: 12/01/22  7:33 PM   Specimen: BLOOD  Result Value Ref Range Status   Specimen Description BLOOD BLOOD LEFT ARM  Final   Special Requests   Final    BOTTLES DRAWN AEROBIC AND ANAEROBIC Blood Culture results may not be optimal due to an excessive volume of blood received in culture bottles   Culture   Final    NO GROWTH 2 DAYS Performed at [facility], [address] [place], [place] [address]    Report Status PENDING  Incomplete  Remove and replace urinary cath (placed > 5 days) then obtain urine culture from new  indwelling urinary catheter.     Status: Abnormal (Preliminary result)   Collection Time: 12/01/22  7:33 PM   Specimen: Urine, Catheterized  Result Value Ref Range Status   Specimen Description   Final    URINE, CATHETERIZED Performed at [facility], [address], [place], [place] [address]    Special Requests   Final    NONE Performed at [facility], [address] [place], [place] [address]    Culture >=100,000 COLONIES/mL GRAM NEGATIVE RODS (A)  Final   Report Status PENDING  Incomplete  MRSA Next Gen by PCR, Nasal     Status: None   Collection Time: 12/02/22  8:42 AM   Specimen: Nasal Mucosa; Nasal Swab  Result Value Ref Range Status   MRSA by PCR Next Gen NOT DETECTED NOT DETECTED Final    Comment: (NOTE) The GeneXpert MRSA Assay (FDA approved for NASAL specimens only), is one component of a comprehensive MRSA colonization surveillance program. It is not intended to diagnose MRSA infection nor to guide or monitor treatment for MRSA infections. Test performance is not FDA approved in patients less than 80 years old. Performed at [facility], [address] [place], [place] [address]     Radiologic Imaging: CT ABDOMEN PELVIS W CONTRAST  Result Date: 12/01/2022 CLINICAL DATA:  Sepsis Abdominal pain, acute, nonlocalized EXAM: CT ABDOMEN AND PELVIS WITH CONTRAST TECHNIQUE: Multidetector CT imaging of the abdomen and pelvis was performed using the standard protocol following bolus administration of intravenous contrast. RADIATION DOSE REDUCTION: This exam was performed according to the departmental dose-optimization program which includes automated exposure control, adjustment of the mA and/or kV according to patient size and/or use of iterative reconstruction technique. CONTRAST:  OMNIPAQUE IOHEXOL 300 MG/ML  SOLN COMPARISON:  None Available. FINDINGS: Lower chest: Filling defects of the right lower lobe pulmonary arteries. No acute  abnormality. Hepatobiliary: The liver is enlarged measuring up to  21.5 cm. No focal liver abnormality. Nonspecific hydropic gallbladder. No gallstones, gallbladder wall thickening, or pericholecystic fluid. No biliary dilatation. Pancreas: No focal lesion. Normal pancreatic contour. No surrounding inflammatory changes. No main pancreatic ductal dilatation. Spleen: Normal in size without focal abnormality. Adrenals/Urinary Tract: No adrenal nodule bilaterally. Bilateral kidneys enhance symmetrically. No frank hydroureteronephrosis. Fullness of bilateral collecting systems. No nephroureterolithiasis. Circumferential urinary bladder wall thickening with perivesicular fat stranding. Foley catheter tip terminates within the urinary bladder lumen. Balloon not well visualized but likely inflated within the urinary bladder lumen. No inflated balloon within the urethra. No excretion of intravenous contrast from either kidneys on delayed view. Stomach/Bowel: Stomach is within normal limits. No evidence of bowel wall thickening or dilatation. Status post appendectomy. Vascular/Lymphatic: No abdominal aorta or iliac aneurysm. Severe atherosclerotic plaque of the aorta and its branches. No abdominal, pelvic, or inguinal lymphadenopathy. Reproductive: No mass. Other: No intraperitoneal free fluid. No intraperitoneal free gas. No organized fluid collection. Musculoskeletal: No abdominal wall hernia or abnormality. No suspicious lytic or blastic osseous lesions. No acute displaced fracture. Multilevel severe degenerative changes of the spine. Multilevel severe osseous neural foraminal stenosis. Thoracic spine surgical hardware partially visualized. IMPRESSION: 1. Right lower lobe segmental and subsegmental pulmonary emboli. Recommend CT angiography pulmonary artery for further evaluation. 2. Cystitis. 3. No excretion of intravenous contrast from either kidneys on delayed view. Correlate with renal function. 4. Hepatomegaly. 5.  Multilevel severe degenerative changes of the spine. Multilevel severe osseous neural foraminal stenosis. 6.  Aortic Atherosclerosis (ICD10-I70.0). These results were called by telephone at the time of interpretation on 12/01/2022 at 9:13 pm to provider [facility] , who verbally acknowledged these results. Electronically Signed   By: Tish Frederickson M.D.   On: 12/01/2022 21:14   Bladder Irrigation  Due to gross hematuria patient is present today for a bladder irrigation. Patient was cleaned and prepped in a clean fashion. 60 mL of sterile saline was instilled into the bladder with a 70mL [place] syringe through the catheter in place.  60mL of urine return was cleared from the bladder with evacuation of <2ccs of tiny clot fragments. Efflux remained clear yellow with the procedure and the catheter irrigated easily. Upon completion, the catheter was draining well and was reattached to the night bag for drainage. Patient tolerated well.   Performed by: Carman Ching, PA-C   Assessment & Plan:  72 year old male with diabetes and prostate cancer and a recent history of spinal fusion who underwent intraoperative bladder neck dilation with complicated Foley catheter placement now admitted with sepsis of urinary origin and multiple PEs on heparin.  Gross hematuria has cleared on arrival today and his hemoglobin is stable, no indication for CBI.  I irrigated the catheter as above and it irrigated easily, extremely low concern for significant retained clot burden.  Agree with antibiotics, he may keep his current catheter since it was exchanged on arrival in the ED.  Given his current illness, recommend keeping the Foley in place with plans for outpatient voiding trial in 2 weeks.  I will get this scheduled.  Recommendations: -Continue antibiotics and follow urine cultures for total of 14 days of culture appropriate therapy -Continue Foley catheter, manually irrigate as needed to clear any gross  hematuria/clots -Outpatient voiding trial in 2 weeks, will schedule  Thank you for involving me in this patient's care, please page with any further questions or concerns.  Carman Ching, PA-C 12/03/2022 9:04 AM

## 2022-12-03 NOTE — Progress Notes (Signed)
[facility]  - [place] [facility]   PATIENT NAME: Alexander Wilcox    MR#:  [id number]  DATE OF BIRTH:  1950/10/22  SUBJECTIVE:  wife and daughter at bedside. Patient more awake alert and oriented. Tolerating PO diet. Little bit tearful remembering younger daughter who passed away several years ago. Emotional support provided. No fever. Patient had some blood clots in the catheter per RN last night. Seen by urology draining yellow urine. No clots noted. VITALS:  Blood pressure (!) 110/56, pulse 100, temperature 97.7 F (36.5 C), temperature source Oral, resp. rate 18, height 5\' 8"  (1.727 m), weight 113.5 kg, SpO2 94 %.  PHYSICAL EXAMINATION:   GENERAL:  72 y.o.-year-old patient with no acute distress. Obese LUNGS: Normal breath sounds bilaterally, no wheezing CARDIOVASCULAR: S1, S2 normal. No murmur tachycardia ABDOMEN: Soft, nontender, nondistended. Bowel sounds present. Chronic Foley catheter with yellow urine EXTREMITIES: No  edema b/l.    NEUROLOGIC: nonfocal  patient is oriented to time place person   LABORATORY PANEL:  CBC Recent Labs  Lab 12/03/22 0210 12/03/22 0720  WBC 14.5*  --   HGB 10.1* 9.7*  HCT 30.2*  --   PLT 254  --      Chemistries  Recent Labs  Lab 12/01/22 1933 12/02/22 0513 12/03/22 0210  NA  --    < > 130*  K  --    < > 3.9  CL  --    < > 94*  CO2  --    < > 22  GLUCOSE  --    < > 221*  BUN  --    < > 38*  CREATININE  --    < > 2.10*  CALCIUM  --    < > 8.4*  AST 17  --   --   ALT 26  --   --   ALKPHOS 114  --   --   BILITOT 1.3*  --   --    < > = values in this interval not displayed.    Cardiac Enzymes No results for input(s): "TROPONINI" in the last 168 hours. RADIOLOGY:  US RENAL  Result Date: 12/03/2022 CLINICAL DATA:  [id number] Acute kidney failure (HCC) [id number] EXAM: RENAL / URINARY TRACT ULTRASOUND COMPLETE COMPARISON:  CT 12/01/2022 FINDINGS: Right Kidney: Renal measurements: 15.0 x 6.3 x 5.5 cm = volume: 267.6  mL. Mild pelviectasis, decreased from prior CT. Normal echogenicity. There is a 2.2 cm cystic lesion in the upper pole, poorly visualized. Left Kidney: Renal measurements: 13.1 x 6.5 x 6.1 cm = volume: 271.8 mL. Mild pelviectasis, decreased from prior CT. Normal echogenicity. Bladder: Decompressed with Foley catheter in place. Other: Hepatic steatosis. IMPRESSION: Mild renal pelviectasis bilaterally. Bladder is decompressed with Foley catheter in place. Electronically Signed   By: Caprice Renshaw M.D.   On: 12/03/2022 11:58   US Venous Img Lower Bilateral (DVT)  Result Date: 12/02/2022 CLINICAL DATA:  Shortness of breath EXAM: BILATERAL LOWER EXTREMITY VENOUS DOPPLER ULTRASOUND TECHNIQUE: Gray-scale sonography with graded compression, as well as color Doppler and duplex ultrasound were performed to evaluate the lower extremity deep venous systems from the level of the common femoral vein and including the common femoral, femoral, profunda femoral, popliteal and calf veins including the posterior tibial, peroneal and gastrocnemius veins when visible. The superficial great saphenous vein was also interrogated. Spectral Doppler was utilized to evaluate flow at rest and with distal augmentation maneuvers in the common femoral, femoral and popliteal veins. COMPARISON:  None Available. FINDINGS: RIGHT LOWER EXTREMITY Common Femoral Vein: No evidence of thrombus. Normal compressibility, respiratory phasicity and response to augmentation. Saphenofemoral Junction: No evidence of thrombus. Normal compressibility and flow on color Doppler imaging. Profunda Femoral Vein: No evidence of thrombus. Normal compressibility and flow on color Doppler imaging. Femoral Vein: No evidence of thrombus. Normal compressibility, respiratory phasicity and response to augmentation. Popliteal Vein: No evidence of thrombus. Normal compressibility, respiratory phasicity and response to augmentation. Calf Veins: No evidence of thrombus. Normal  compressibility and flow on color Doppler imaging. Superficial Great Saphenous Vein: No evidence of thrombus. Normal compressibility. Venous Reflux:  None. Other Findings:  None. LEFT LOWER EXTREMITY Common Femoral Vein: No evidence of thrombus. Normal compressibility, respiratory phasicity and response to augmentation. Saphenofemoral Junction: No evidence of thrombus. Normal compressibility and flow on color Doppler imaging. Profunda Femoral Vein: No evidence of thrombus. Normal compressibility and flow on color Doppler imaging. Femoral Vein: No evidence of thrombus. Normal compressibility, respiratory phasicity and response to augmentation. Popliteal Vein: No evidence of thrombus. Normal compressibility, respiratory phasicity and response to augmentation. Calf Veins: There is occlusive thrombus with noncompressibility in one of the paired posterior tibial veins. Superficial Great Saphenous Vein: No evidence of thrombus. Normal compressibility. Venous Reflux:  None. Other Findings:  None. IMPRESSION: 1. Occlusive thrombus with noncompressibility in one of the paired posterior tibial veins on the left. 2. No evidence of DVT on the right. Electronically Signed   By: Lesia Hausen M.D.   On: 12/02/2022 09:52   DG Chest Port 1 View  Result Date: 12/02/2022 CLINICAL DATA:  Dyspnea. EXAM: PORTABLE CHEST 1 VIEW COMPARISON:  CTA chest yesterday at 10:19 p.m. FINDINGS: The heart size and mediastinal contours are within normal limits. Calcification is again noted in the transverse aorta. Both lungs are clear. The visualized skeletal structures are unremarkable apart from multilevel lower thoracic dorsal fusion rods and pedicle screws. IMPRESSION: No acute radiographic findings. Multiple right lower lobe emboli were noted however, on the CTA. Electronically Signed   By: Almira Bar M.D.   On: 12/02/2022 05:33   CT Angio Chest PE W and/or Wo Contrast  Result Date: 12/01/2022 CLINICAL DATA:  Pulmonary embolism (PE)  suspected, high prob EXAM: CT ANGIOGRAPHY CHEST WITH CONTRAST TECHNIQUE: Multidetector CT imaging of the chest was performed using the standard protocol during bolus administration of intravenous contrast. Multiplanar CT image reconstructions and MIPs were obtained to evaluate the vascular anatomy. RADIATION DOSE REDUCTION: This exam was performed according to the departmental dose-optimization program which includes automated exposure control, adjustment of the mA and/or kV according to patient size and/or use of iterative reconstruction technique. CONTRAST:  75mL OMNIPAQUE IOHEXOL 350 MG/ML SOLN COMPARISON:  11/11/2022 FINDINGS: Cardiovascular: Multiple filling defects within right lower lobe pulmonary arterial branches compatible with multiple pulmonary emboli. No definite pulmonary emboli on the left. No evidence of right heart strain. Coronary artery and aortic calcifications. No evidence of aortic aneurysm. Mediastinum/Nodes: No mediastinal, hilar, or axillary adenopathy. Trachea and esophagus are unremarkable. Thyroid unremarkable. Lungs/Pleura: Lungs are clear. No focal airspace opacities or suspicious nodules. No effusions. Upper Abdomen: No acute findings Musculoskeletal: Chest wall soft tissues are unremarkable. No acute bony abnormality. Postoperative changes in the thoracic spine from posterior fusion. Review of the MIP images confirms the above findings. IMPRESSION: Multiple segmental and subsegmental filling defects in the right lower lobe compatible with multiple pulmonary emboli. No evidence of right heart strain. Scattered aortic atherosclerosis and coronary artery disease. These results were called by  telephone at the time of interpretation on 12/01/2022 at 10:25 pm to provider [facility] , who verbally acknowledged these results. Electronically Signed   By: Charlett Nose M.D.   On: 12/01/2022 22:27   CT ABDOMEN PELVIS W CONTRAST  Result Date: 12/01/2022 CLINICAL DATA:  Sepsis Abdominal pain,  acute, nonlocalized EXAM: CT ABDOMEN AND PELVIS WITH CONTRAST TECHNIQUE: Multidetector CT imaging of the abdomen and pelvis was performed using the standard protocol following bolus administration of intravenous contrast. RADIATION DOSE REDUCTION: This exam was performed according to the departmental dose-optimization program which includes automated exposure control, adjustment of the mA and/or kV according to patient size and/or use of iterative reconstruction technique. CONTRAST:  OMNIPAQUE IOHEXOL 300 MG/ML  SOLN COMPARISON:  None Available. FINDINGS: Lower chest: Filling defects of the right lower lobe pulmonary arteries. No acute abnormality. Hepatobiliary: The liver is enlarged measuring up to 21.5 cm. No focal liver abnormality. Nonspecific hydropic gallbladder. No gallstones, gallbladder wall thickening, or pericholecystic fluid. No biliary dilatation. Pancreas: No focal lesion. Normal pancreatic contour. No surrounding inflammatory changes. No main pancreatic ductal dilatation. Spleen: Normal in size without focal abnormality. Adrenals/Urinary Tract: No adrenal nodule bilaterally. Bilateral kidneys enhance symmetrically. No frank hydroureteronephrosis. Fullness of bilateral collecting systems. No nephroureterolithiasis. Circumferential urinary bladder wall thickening with perivesicular fat stranding. Foley catheter tip terminates within the urinary bladder lumen. Balloon not well visualized but likely inflated within the urinary bladder lumen. No inflated balloon within the urethra. No excretion of intravenous contrast from either kidneys on delayed view. Stomach/Bowel: Stomach is within normal limits. No evidence of bowel wall thickening or dilatation. Status post appendectomy. Vascular/Lymphatic: No abdominal aorta or iliac aneurysm. Severe atherosclerotic plaque of the aorta and its branches. No abdominal, pelvic, or inguinal lymphadenopathy. Reproductive: No mass. Other: No intraperitoneal free  fluid. No intraperitoneal free gas. No organized fluid collection. Musculoskeletal: No abdominal wall hernia or abnormality. No suspicious lytic or blastic osseous lesions. No acute displaced fracture. Multilevel severe degenerative changes of the spine. Multilevel severe osseous neural foraminal stenosis. Thoracic spine surgical hardware partially visualized. IMPRESSION: 1. Right lower lobe segmental and subsegmental pulmonary emboli. Recommend CT angiography pulmonary artery for further evaluation. 2. Cystitis. 3. No excretion of intravenous contrast from either kidneys on delayed view. Correlate with renal function. 4. Hepatomegaly. 5. Multilevel severe degenerative changes of the spine. Multilevel severe osseous neural foraminal stenosis. 6.  Aortic Atherosclerosis (ICD10-I70.0). These results were called by telephone at the time of interpretation on 12/01/2022 at 9:13 pm to provider [facility] , who verbally acknowledged these results. Electronically Signed   By: Tish Frederickson M.D.   On: 12/01/2022 21:14   CT Head Wo Contrast  Result Date: 12/01/2022 CLINICAL DATA:  Altered mental status EXAM: CT HEAD WITHOUT CONTRAST TECHNIQUE: Contiguous axial images were obtained from the base of the skull through the vertex without intravenous contrast. RADIATION DOSE REDUCTION: This exam was performed according to the departmental dose-optimization program which includes automated exposure control, adjustment of the mA and/or kV according to patient size and/or use of iterative reconstruction technique. COMPARISON:  11/12/2022 FINDINGS: Brain: No acute intracranial findings are seen. There are no signs of bleeding within the cranium. Cortical sulci are prominent. There is no focal edema or mass effect. Ventricles are not dilated. Vascular: Unremarkable. Skull: No acute findings are seen. Degenerative changes are noted in upper cervical spine at C1-C2 level. Sinuses/Orbits: No acute findings are seen. Other: None.  IMPRESSION: No acute intracranial findings are seen.  Atrophy. Electronically  Signed   By: Ernie Avena M.D.   On: 12/01/2022 20:37   DG Chest Port 1 View  Result Date: 12/01/2022 CLINICAL DATA:  History of recent UTI treated with antibiotics, presenting with altered mental status. EXAM: PORTABLE CHEST 1 VIEW COMPARISON:  Nov 11, 2022 FINDINGS: The heart size and mediastinal contours are within normal limits. There is marked severity calcification of the aortic arch. Low lung volumes are noted. There is no evidence of an acute infiltrate, pleural effusion or pneumothorax. Interval postoperative changes are seen involving the mid to lower thoracic spine and upper lumbar spine, since the prior exam. A chronic deformity is seen involving the neck of the proximal left humerus. IMPRESSION: 1. Low lung volumes without evidence of acute or active cardiopulmonary disease. 2. Interval postoperative changes within the thoracolumbar spine since the prior study. Electronically Signed   By: Aram Candela M.D.   On: 12/01/2022 20:05    Assessment and Plan    JOHANNES DADDIO is a 72 y.o. 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 Patient went to rehab with Foley catheter. He was seen on follow-up with urology on 21st May and decision was to continue Foley catheter for now since patient was getting rehab and be removed on December 04, 2022.  Patient came in with increasing confusion, hematuria, tachycardic and was admitted with sepsis suspected due to UTI in the setting of Foley catheter. Patient was also noted to have multiple pulmonary  emboli on CT scan. Currently on IV heparin drip.  CT head nonacute CT abdomen and pelvis showing cystitis and multilevel severe degenerative changes of the spine as well as evidence of pulmonary emboli CTA chest confirms multiple segmental and subsegmental filling defects in the right lower lobe compatible with multiple pulmonary emboli without evidence of right heart strain.  Catheter-associated urinary tract infection (HCC) History of bladder neck stricture requiring dilatation 11/12/2022 with indwelling Foley placed in the OR by dr Richardo Hanks Prostate cancer on leuprolide Severe Sepsis --Patient presents with weakness, altered mental status in the setting of UTI on outpatient antibiotics.  Has tachycardia, leukocytosis with normal lactic acid.  AKI, altered mental status/metabolic encephalopathy --Sepsis fluids --Blood culture neg --UC >100K E coli --Foley was exchanged in the ED on 12/01/2022 -- infectious disease consultation with Dr. Quincy Simmonds Rocephin   Multiple pulmonary emboli [facility]) Left Posterior tibial vein DVT --Continue heparin infusion --Provoked PE from recent surgery, immobilization --Supplemental oxygen if needed --Incentive spirometry --[place] LE noted to have left pot tibial vein DVT   Acute metabolic encephalopathy/Delirium --Secondary to UTI and PE --CT head nonacute --Aspiration precautions   AKI (acute kidney injury) (HCC)/Hyponatremia --Creatinine 1.38--1.79--2.10 --baseline of 1.0 --Suspecting related to dehydration from poor oral intake --Will hold lisinopril, HCTZ, metformin.  Avoid nephrotoxins -- nephrology consultation with Dr. 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 Marcell Barlow in the ED --Continue multimodal pain control with gabapentin, duloxetine and narcotics as needed once mentation better    Hyponatremia Suspect dehydration from poor oral intake --As above   Uncontrolled type  2 diabetes mellitus with hyperglycemia, with long-term current use of insulin (HCC) --Blood sugar 263 ---Continue basal insulin -Sliding scale insulin coverage   Carotid artery stenosis Possible prior TIA 11/12/2022 -CTA 11/12/22 showed Occluded right vertebral artery ...,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 -Hold aspirin while on systemic anticoagulation - Continue atorvastatin and fenofibrate   Frailty Patient has recent prolonged hospitalization, recent back surgery with chronic comorbidities and current acute illnesses Currently at SNF s/p recent hospitalization from 5/10 to 5/16 PT eval and treat while in-house when mentation improves   OSA on CPAP Continue CPAP   Essential hypertension Continue metoprolol As needed hydralazine Will hold lisinopril HCTZ given hyponatremia and AKI     DVT prophylaxis: Heparin Code status: full Family communication :wife [name] and dter at bedside Consults : ID, neurosurgery, nephrology  Level of care: Stepdown Status is: Inpatient Remains inpatient appropriate because: patient is critically ill sepsis, Encephalopathy, UTI, PE    TOTAL critical TIME TAKING CARE OF THIS PATIENT: 50 minutes.  >50% time spent on counselling and coordination of care  Note: This dictation was prepared with Dragon dictation along with smaller phrase technology. Any transcriptional errors that result from this process are unintentional.  Enedina Finner M.D    [facility]   CC: Primary care physician; Marisue Ivan, MD

## 2022-12-03 NOTE — Progress Notes (Signed)
Date of Admission:  12/01/2022      ID: Alexander Wilcox is a 72 y.o. 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   AKI (acute kidney injury) (HCC)   S/P fusion of thoracic spine 11/12/22 for traumatic T9 fracture   Pulmonary embolus (HCC)   S/P dilation of urethra 11/12/22 with indwelling Foley   Acute metabolic encephalopathy   Frailty   Carotid artery stenosis   Sepsis (HCC)    Subjective: Pt feeling better today Last night had blood clots in the foley and had to be flushed  Medications:   atorvastatin  40 mg Oral Daily   Chlorhexidine Gluconate Cloth  6 each Topical Daily   cyanocobalamin  1,000 mcg Oral Daily   DULoxetine  30 mg Oral Daily   fenofibrate  160 mg Oral Daily   insulin aspart  0-5 Units Subcutaneous QHS   insulin aspart  0-9 Units Subcutaneous TID WC   insulin glargine-yfgn  10 Units Subcutaneous Daily   tamsulosin  0.4 mg Oral Daily    Objective: Vital signs in last 24 hours: Patient Vitals for the past 24 hrs:  BP Temp Temp src Pulse Resp SpO2 Weight  12/03/22 0800 (!) 100/48 97.9 F (36.6 C) Oral (!) 109 15 97 % --  12/03/22 0723 -- -- -- (!) 107 (!) 21 98 % --  12/03/22 0700 (!) 89/43 -- -- (!) 108 16 97 % --  12/03/22 0648 (!) 99/40 -- -- (!) 111 16 94 % --  12/03/22 0400 (!) 103/44 99.6 F (37.6 C) Oral (!) 111 19 96 % --  12/03/22 0332 -- -- -- -- -- -- 113.5 kg  12/03/22 0300 (!) 110/48 -- -- (!) 140 19 90 % --  12/03/22 0200 (!) 153/66 -- -- (!) 110 19 97 % --  12/03/22 0100 136/63 -- -- (!) 104 13 100 % --  12/03/22 0000 129/60 99.1 F (37.3 C) Oral (!) 105 13 99 % --   12/02/22 2300 109/63 -- -- (!) 104 13 98 % --  12/02/22 2200 98/82 -- -- (!) 107 19 100 % --  12/02/22 2100 (!) 119/59 -- -- (!) 108 16 98 % --  12/02/22 2000 125/64 -- -- (!) 109 16 97 % --  12/02/22 1939 -- 100.1 F (37.8 C) -- -- -- -- --  12/02/22 1900 (!) 145/76 -- -- (!) 108 19 97 % --  12/02/22 1800 (!) 138/92 -- -- (!) 103 16 99 % --  12/02/22 1700 (!) 155/68 (!) 100.8 F (38.2 C) -- (!) 101 18 97 % --  12/02/22 1600 (!) 152/66 100.2 F (37.9 C) -- 96 17 100 % --  12/02/22 1500 (!) 110/58 99.7 F (37.6 C) -- 98 19 96 % --  12/02/22 1400 123/62 99.7 F (37.6 C) -- (!) 104 17 98 % --  12/02/22 1300 (!) 110/54 100.2 F (37.9 C) -- (!) 105 18 98 % --  12/02/22 1200 108/62 (!) 101.8 F (38.8 C) -- (!) 110 (!) 24 92 % --     LDA Foley   PHYSICAL EXAM:  General: Alert, cooperative, no  distress, appears stated age.  Head: Normocephalic, without obvious abnormality, atraumatic. Eyes: Conjunctivae clear, anicteric sclerae. Pupils are equal ENT CPAP Neck: symmetrical, no adenopathy, thyroid: non tender no carotid bruit and no JVD. Back: No CVA tenderness. Lungs: Clear to auscultation bilaterally. No Wheezing or Rhonchi. No rales. Heart: irregular. Abdomen: Soft, non-tender,not distended. Bowel sounds normal. No masses foley Extremities: atraumatic, no cyanosis. No edema. No clubbing Skin: No rashes or lesions. Or bruising Lymph: Cervical, supraclavicular normal. Neurologic: Grossly non-focal  Lab Results    Latest Ref Rng & Units 12/03/2022    7:20 AM 12/03/2022    2:10 AM 12/02/2022    5:13 AM  CBC  WBC 4.0 - 10.5 K/uL  14.5  15.4   Hemoglobin 13.0 - 17.0 g/dL 9.7  16.1  09.6   Hematocrit 39.0 - 52.0 %  30.2  31.2   Platelets 150 - 400 K/uL  254  268        Latest Ref Rng & Units 12/02/2022    5:13 AM 12/01/2022    7:33 PM 12/01/2022    4:35 PM  CMP  Glucose 70 - 99 mg/dL 045   409   BUN 8 - 23 mg/dL 33   30   Creatinine 8.11 - 1.24 mg/dL 9.14   7.82   Sodium  956 - 145 mmol/L 124   124   Potassium 3.5 - 5.1 mmol/L 4.3   4.1   Chloride 98 - 111 mmol/L 91   90   CO2 22 - 32 mmol/L 24   24   Calcium 8.9 - 10.3 mg/dL 8.4   8.8   Total Protein 6.5 - 8.1 g/dL  7.7    Total Bilirubin 0.3 - 1.2 mg/dL  1.3    Alkaline Phos 38 - 126 U/L  114    AST 15 - 41 U/L  17    ALT 0 - 44 U/L  26        Microbiology:  Studies/Results: US Venous Img Lower Bilateral (DVT)  Result Date: 12/02/2022 CLINICAL DATA:  Shortness of breath EXAM: BILATERAL LOWER EXTREMITY VENOUS DOPPLER ULTRASOUND TECHNIQUE: Gray-scale sonography with graded compression, as well as color Doppler and duplex ultrasound were performed to evaluate the lower extremity deep venous systems from the level of the common femoral vein and including the common femoral, femoral, profunda femoral, popliteal and calf veins including the posterior tibial, peroneal and gastrocnemius veins when visible. The superficial great saphenous vein was also interrogated. Spectral Doppler was utilized to evaluate flow at rest and with distal augmentation maneuvers in the common femoral, femoral and popliteal veins. COMPARISON:  None Available. FINDINGS: RIGHT LOWER EXTREMITY Common Femoral Vein: No evidence of thrombus. Normal compressibility, respiratory phasicity and response to augmentation. Saphenofemoral Junction: No evidence of thrombus. Normal compressibility and flow on color Doppler imaging. Profunda Femoral Vein: No evidence of thrombus. Normal compressibility and flow on color Doppler imaging. Femoral Vein: No evidence of thrombus. Normal compressibility, respiratory phasicity and response to augmentation. Popliteal Vein: No evidence of thrombus. Normal compressibility, respiratory phasicity and response to augmentation. Calf Veins: No evidence of thrombus. Normal compressibility and flow on color Doppler imaging. Superficial Great Saphenous Vein: No evidence of thrombus. Normal compressibility. Venous Reflux:  None.  Other Findings:  None. LEFT LOWER EXTREMITY Common Femoral Vein: No evidence of thrombus. Normal compressibility, respiratory phasicity and response to augmentation. Saphenofemoral Junction: No evidence of thrombus. Normal compressibility and flow on color Doppler imaging. Profunda Femoral Vein: No evidence of thrombus. Normal  compressibility and flow on color Doppler imaging. Femoral Vein: No evidence of thrombus. Normal compressibility, respiratory phasicity and response to augmentation. Popliteal Vein: No evidence of thrombus. Normal compressibility, respiratory phasicity and response to augmentation. Calf Veins: There is occlusive thrombus with noncompressibility in one of the paired posterior tibial veins. Superficial Great Saphenous Vein: No evidence of thrombus. Normal compressibility. Venous Reflux:  None. Other Findings:  None. IMPRESSION: 1. Occlusive thrombus with noncompressibility in one of the paired posterior tibial veins on the left. 2. No evidence of DVT on the right. Electronically Signed   By: Lesia Hausen M.D.   On: 12/02/2022 09:52   DG Chest Port 1 View  Result Date: 12/02/2022 CLINICAL DATA:  Dyspnea. EXAM: PORTABLE CHEST 1 VIEW COMPARISON:  CTA chest yesterday at 10:19 p.m. FINDINGS: The heart size and mediastinal contours are within normal limits. Calcification is again noted in the transverse aorta. Both lungs are clear. The visualized skeletal structures are unremarkable apart from multilevel lower thoracic dorsal fusion rods and pedicle screws. IMPRESSION: No acute radiographic findings. Multiple right lower lobe emboli were noted however, on the CTA. Electronically Signed   By: Almira Bar M.D.   On: 12/02/2022 05:33   CT Angio Chest PE W and/or Wo Contrast  Result Date: 12/01/2022 CLINICAL DATA:  Pulmonary embolism (PE) suspected, high prob EXAM: CT ANGIOGRAPHY CHEST WITH CONTRAST TECHNIQUE: Multidetector CT imaging of the chest was performed using the standard protocol  during bolus administration of intravenous contrast. Multiplanar CT image reconstructions and MIPs were obtained to evaluate the vascular anatomy. RADIATION DOSE REDUCTION: This exam was performed according to the departmental dose-optimization program which includes automated exposure control, adjustment of the mA and/or kV according to patient size and/or use of iterative reconstruction technique. CONTRAST:  75mL OMNIPAQUE IOHEXOL 350 MG/ML SOLN COMPARISON:  11/11/2022 FINDINGS: Cardiovascular: Multiple filling defects within right lower lobe pulmonary arterial branches compatible with multiple pulmonary emboli. No definite pulmonary emboli on the left. No evidence of right heart strain. Coronary artery and aortic calcifications. No evidence of aortic aneurysm. Mediastinum/Nodes: No mediastinal, hilar, or axillary adenopathy. Trachea and esophagus are unremarkable. Thyroid unremarkable. Lungs/Pleura: Lungs are clear. No focal airspace opacities or suspicious nodules. No effusions. Upper Abdomen: No acute findings Musculoskeletal: Chest wall soft tissues are unremarkable. No acute bony abnormality. Postoperative changes in the thoracic spine from posterior fusion. Review of the MIP images confirms the above findings. IMPRESSION: Multiple segmental and subsegmental filling defects in the right lower lobe compatible with multiple pulmonary emboli. No evidence of right heart strain. Scattered aortic atherosclerosis and coronary artery disease. These results were called by telephone at the time of interpretation on 12/01/2022 at 10:25 pm to provider [facility] , who verbally acknowledged these results. Electronically Signed   By: Charlett Nose M.D.   On: 12/01/2022 22:27   CT ABDOMEN PELVIS W CONTRAST  Result Date: 12/01/2022 CLINICAL DATA:  Sepsis Abdominal pain, acute, nonlocalized EXAM: CT ABDOMEN AND PELVIS WITH CONTRAST TECHNIQUE: Multidetector CT imaging of the abdomen and pelvis was performed using the  standard protocol following bolus administration of intravenous contrast. RADIATION DOSE REDUCTION: This exam was performed according to the departmental dose-optimization program which includes automated exposure control, adjustment of the mA and/or kV according to patient size and/or use of iterative reconstruction technique. CONTRAST:  OMNIPAQUE IOHEXOL 300 MG/ML  SOLN COMPARISON:  None Available. FINDINGS: Lower chest: Filling defects of the right lower lobe pulmonary arteries. No acute abnormality. Hepatobiliary: The liver is enlarged measuring  up to 21.5 cm. No focal liver abnormality. Nonspecific hydropic gallbladder. No gallstones, gallbladder wall thickening, or pericholecystic fluid. No biliary dilatation. Pancreas: No focal lesion. Normal pancreatic contour. No surrounding inflammatory changes. No main pancreatic ductal dilatation. Spleen: Normal in size without focal abnormality. Adrenals/Urinary Tract: No adrenal nodule bilaterally. Bilateral kidneys enhance symmetrically. No frank hydroureteronephrosis. Fullness of bilateral collecting systems. No nephroureterolithiasis. Circumferential urinary bladder wall thickening with perivesicular fat stranding. Foley catheter tip terminates within the urinary bladder lumen. Balloon not well visualized but likely inflated within the urinary bladder lumen. No inflated balloon within the urethra. No excretion of intravenous contrast from either kidneys on delayed view. Stomach/Bowel: Stomach is within normal limits. No evidence of bowel wall thickening or dilatation. Status post appendectomy. Vascular/Lymphatic: No abdominal aorta or iliac aneurysm. Severe atherosclerotic plaque of the aorta and its branches. No abdominal, pelvic, or inguinal lymphadenopathy. Reproductive: No mass. Other: No intraperitoneal free fluid. No intraperitoneal free gas. No organized fluid collection. Musculoskeletal: No abdominal wall hernia or abnormality. No suspicious lytic or  blastic osseous lesions. No acute displaced fracture. Multilevel severe degenerative changes of the spine. Multilevel severe osseous neural foraminal stenosis. Thoracic spine surgical hardware partially visualized. IMPRESSION: 1. Right lower lobe segmental and subsegmental pulmonary emboli. Recommend CT angiography pulmonary artery for further evaluation. 2. Cystitis. 3. No excretion of intravenous contrast from either kidneys on delayed view. Correlate with renal function. 4. Hepatomegaly. 5. Multilevel severe degenerative changes of the spine. Multilevel severe osseous neural foraminal stenosis. 6.  Aortic Atherosclerosis (ICD10-I70.0). These results were called by telephone at the time of interpretation on 12/01/2022 at 9:13 pm to provider [facility] , who verbally acknowledged these results. Electronically Signed   By: Tish Frederickson M.D.   On: 12/01/2022 21:14   CT Head Wo Contrast  Result Date: 12/01/2022 CLINICAL DATA:  Altered mental status EXAM: CT HEAD WITHOUT CONTRAST TECHNIQUE: Contiguous axial images were obtained from the base of the skull through the vertex without intravenous contrast. RADIATION DOSE REDUCTION: This exam was performed according to the departmental dose-optimization program which includes automated exposure control, adjustment of the mA and/or kV according to patient size and/or use of iterative reconstruction technique. COMPARISON:  11/12/2022 FINDINGS: Brain: No acute intracranial findings are seen. There are no signs of bleeding within the cranium. Cortical sulci are prominent. There is no focal edema or mass effect. Ventricles are not dilated. Vascular: Unremarkable. Skull: No acute findings are seen. Degenerative changes are noted in upper cervical spine at C1-C2 level. Sinuses/Orbits: No acute findings are seen. Other: None. IMPRESSION: No acute intracranial findings are seen.  Atrophy. Electronically Signed   By: Ernie Avena M.D.   On: 12/01/2022 20:37   DG  Chest Port 1 View  Result Date: 12/01/2022 CLINICAL DATA:  History of recent UTI treated with antibiotics, presenting with altered mental status. EXAM: PORTABLE CHEST 1 VIEW COMPARISON:  Nov 11, 2022 FINDINGS: The heart size and mediastinal contours are within normal limits. There is marked severity calcification of the aortic arch. Low lung volumes are noted. There is no evidence of an acute infiltrate, pleural effusion or pneumothorax. Interval postoperative changes are seen involving the mid to lower thoracic spine and upper lumbar spine, since the prior exam. A chronic deformity is seen involving the neck of the proximal left humerus. IMPRESSION: 1. Low lung volumes without evidence of acute or active cardiopulmonary disease. 2. Interval postoperative changes within the thoracolumbar spine since the prior study. Electronically Signed   By: Waylan Rocher  [place] M.D.   On: 12/01/2022 20:05     Assessment/Plan: Acute encephalopathy resolved Combination of infection/meds/OSA  Acute PE with left leg DVT on anticoagulation  Complicated UTI due to E.coli Bladder neck stricture needing dilatation and foley placement Foley was obstructed by blood clots in the [facility] Get bladder scan Has hematuria and blood clots in the bladder- urology has been consulted Continue ceftriaxone  Anemia  AKI worsening  Recent T9 fracture s/p arthrodesis on 5/10- surgical site healed well  Discussed the management with the patient and his wife and daughter and care team [facility] on call this weekend .  Available by phone for urgent issues

## 2022-12-04 DIAGNOSIS — G9341 Metabolic encephalopathy: Secondary | ICD-10-CM | POA: Diagnosis not present

## 2022-12-04 DIAGNOSIS — N390 Urinary tract infection, site not specified: Secondary | ICD-10-CM | POA: Diagnosis not present

## 2022-12-04 DIAGNOSIS — Z8546 Personal history of malignant neoplasm of prostate: Secondary | ICD-10-CM

## 2022-12-04 DIAGNOSIS — N179 Acute kidney failure, unspecified: Secondary | ICD-10-CM | POA: Diagnosis not present

## 2022-12-04 DIAGNOSIS — N320 Bladder-neck obstruction: Secondary | ICD-10-CM | POA: Diagnosis not present

## 2022-12-04 LAB — BASIC METABOLIC PANEL
Anion gap: 6 (ref 5–15)
BUN: 21 mg/dL (ref 8–23)
CO2: 25 mmol/L (ref 22–32)
Calcium: 8.6 mg/dL — ABNORMAL LOW (ref 8.9–10.3)
Chloride: 102 mmol/L (ref 98–111)
Creatinine, Ser: 1.11 mg/dL (ref 0.61–1.24)
GFR, Estimated: >60 mL/min (ref >60)
Glucose, Bld: 157 mg/dL — ABNORMAL HIGH (ref 70–99)
Potassium: 3.6 mmol/L (ref 3.5–5.1)
Sodium: 133 mmol/L — ABNORMAL LOW (ref 135–145)

## 2022-12-04 LAB — RENAL FUNCTION PANEL
Albumin: 2.3 g/dL — ABNORMAL LOW (ref 3.5–5.0)
Anion gap: 7 (ref 5–15)
BUN: 21 mg/dL (ref 8–23)
CO2: 24 mmol/L (ref 22–32)
Calcium: 8.5 mg/dL — ABNORMAL LOW (ref 8.9–10.3)
Chloride: 101 mmol/L (ref 98–111)
Creatinine, Ser: 1.11 mg/dL (ref 0.61–1.24)
GFR, Estimated: >60 mL/min (ref >60)
Glucose, Bld: 154 mg/dL — ABNORMAL HIGH (ref 70–99)
Phosphorus: 2.4 mg/dL — ABNORMAL LOW (ref 2.5–4.6)
Potassium: 3.6 mmol/L (ref 3.5–5.1)
Sodium: 132 mmol/L — ABNORMAL LOW (ref 135–145)

## 2022-12-04 LAB — URINE CULTURE: Culture: >=100000 — AB

## 2022-12-04 LAB — HEPARIN LEVEL (UNFRACTIONATED)
Heparin Unfractionated: 0.14 IU/mL — ABNORMAL LOW (ref 0.30–0.70)
Heparin Unfractionated: 0.21 IU/mL — ABNORMAL LOW (ref 0.30–0.70)
Heparin Unfractionated: 0.32 IU/mL (ref 0.30–0.70)

## 2022-12-04 LAB — CBC
HCT: 26 % — ABNORMAL LOW (ref 39.0–52.0)
Hemoglobin: 8.7 g/dL — ABNORMAL LOW (ref 13.0–17.0)
MCH: 28.3 pg (ref 26.0–34.0)
MCHC: 33.5 g/dL (ref 30.0–36.0)
MCV: 84.7 fL (ref 80.0–100.0)
Platelets: 227 10*3/uL (ref 150–400)
RBC: 3.07 MIL/uL — ABNORMAL LOW (ref 4.22–5.81)
RDW: 13 % (ref 11.5–15.5)
WBC: 5.7 10*3/uL (ref 4.0–10.5)
nRBC: 0 % (ref 0.0–0.2)

## 2022-12-04 LAB — GLUCOSE, CAPILLARY
Glucose-Capillary: 147 mg/dL — ABNORMAL HIGH (ref 70–99)
Glucose-Capillary: 168 mg/dL — ABNORMAL HIGH (ref 70–99)
Glucose-Capillary: 127 mg/dL — ABNORMAL HIGH (ref 70–99)
Glucose-Capillary: 143 mg/dL — ABNORMAL HIGH (ref 70–99)

## 2022-12-04 LAB — CULTURE, BLOOD (ROUTINE X 2)

## 2022-12-04 MED ORDER — GABAPENTIN 400 MG PO CAPS
800.0000 mg | ORAL_CAPSULE | Freq: Three times a day (TID) | ORAL | Status: DC
  Administered 2022-12-04 – 2022-12-07 (×10): 800 mg via ORAL
  Filled 2022-12-04 (×10): qty 2

## 2022-12-04 MED ORDER — OXYCODONE HCL 5 MG PO TABS
7.5000 mg | ORAL_TABLET | ORAL | Status: DC | PRN
  Administered 2022-12-04 – 2022-12-07 (×9): 7.5 mg via ORAL
  Filled 2022-12-04 (×9): qty 2

## 2022-12-04 MED ORDER — HEPARIN BOLUS VIA INFUSION
1400.0000 [IU] | Freq: Once | INTRAVENOUS | Status: AC
  Administered 2022-12-04: 1400 [IU] via INTRAVENOUS
  Filled 2022-12-04: qty 1400

## 2022-12-04 MED ORDER — HEPARIN BOLUS VIA INFUSION
1500.0000 [IU] | Freq: Once | INTRAVENOUS | Status: AC
  Administered 2022-12-04: 1500 [IU] via INTRAVENOUS
  Filled 2022-12-04: qty 1500

## 2022-12-04 MED ORDER — PREGABALIN 75 MG PO CAPS
75.0000 mg | ORAL_CAPSULE | Freq: Two times a day (BID) | ORAL | Status: DC
  Administered 2022-12-04 – 2022-12-07 (×7): 75 mg via ORAL
  Filled 2022-12-04 (×7): qty 1

## 2022-12-04 MED ORDER — MORPHINE SULFATE (PF) 2 MG/ML IV SOLN
2.0000 mg | Freq: Once | INTRAVENOUS | Status: AC
  Administered 2022-12-04: 2 mg via INTRAVENOUS
  Filled 2022-12-04: qty 1

## 2022-12-04 NOTE — Progress Notes (Signed)
Report given to receiving nurse, Montel Clock.  Patient transferred to room 210 on hospital bed, with all belongings, and cardiac monitoring.

## 2022-12-04 NOTE — Progress Notes (Signed)
ANTICOAGULATION CONSULT NOTE  Pharmacy Consult for heparin infusion Indication: pulmonary embolus  Allergies  Allergen Reactions   Dilaudid [Hydromorphone Hcl] Itching    Patient Measurements: Height: 5\' 8"  (172.7 cm) Weight: 113.5 kg (250 lb 3.6 oz) IBW/kg (Calculated) : 68.4 Heparin Dosing Weight: 94.7 kg  Vital Signs: Temp: 97.9 F (36.6 C) (06/01 0400) Temp Source: Oral (06/01 0400) BP: 116/75 (06/01 0400) Pulse Rate: 84 (06/01 0400)  Labs: Recent Labs    12/01/22 1635 12/01/22 1933 12/02/22 0513 12/02/22 1452 12/03/22 0210 12/03/22 0720 12/03/22 1336 12/03/22 1505 12/04/22 0015  HGB 10.7*  --  10.9*  --  10.1* 9.7*  --   --   --   HCT 31.3*  --  31.2*  --  30.2*  --   --   --   --   PLT 262  --  268  --  254  --   --   --   --   APTT  --  33  --   --   --   --   --   --   --   LABPROT  --  15.3*  --   --   --   --   --   --   --   INR  --  1.2  --   --   --   --   --   --   --   HEPARINUNFRC  --   --  <0.10*   < > <0.10*  --  <0.10* <0.10* 0.14*  CREATININE 1.38*  --  1.79*  --  2.10*  --   --   --   --    < > = values in this interval not displayed.     Estimated Creatinine Clearance: 40 mL/min (A) (by C-G formula based on SCr of 2.1 mg/dL (H)).   Medical History: Past Medical History:  Diagnosis Date   Arthritis    Diabetes mellitus without complication (HCC)    Hypertension    Prostate cancer (HCC)    Sleep apnea     Assessment: Pt is a 72 yo male presenting to ED d/t AMS, found with "multiple filling defects within right lower lobe pulmonary arterial branches compatible with multiple pulmonary emboli."  Goal of Therapy:  Heparin level 0.3-0.7 units/ml Monitor platelets by anticoagulation protocol: Yes   Plan: heparin level remains subtherapeutic despite rate increases --Will give heparin bolus 1500 units IV x 1 --Increase heparin infusion to 3250 --Recheck heparin level in 8 hrs after rate change -- CBC daily while on heparin  Burnis Medin, PharmD, BCPS 12/04/2022 7:49 AM
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
## 2022-12-04 NOTE — Progress Notes (Signed)
[facility]  ROUNDING NOTE   Subjective:   Moved out of ICU.  Daughter at bedside.   UOP 2.8 liters.   Objective:  Vital signs in last 24 hours:  Temp:  [97.7 F (36.5 C)-98.8 F (37.1 C)] 97.7 F (36.5 C) (06/01 1103) Pulse Rate:  [75-99] 78 (06/01 1103) Resp:  [9-18] 18 (06/01 1103) BP: (108-134)/(54-75) 130/63 (06/01 1103) SpO2:  [95 %-100 %] 100 % (06/01 1103) FiO2 (%):  [21 %] 21 % (05/31 1940)  Weight change:  Filed Weights   12/01/22 1633 12/03/22 0332  Weight: 116.1 kg 113.5 kg    Intake/Output: I/O last 3 completed shifts: In: 5723.7 [I.V.:5217.3; IV Piggyback:506.4] Out: 5027 [Urine:5025; Stool:2]   Intake/Output this shift:  Total I/O In: 316.4 [I.V.:316.4] Out: 350 [Urine:350]  Physical Exam: General: NAD, laying in bed  Head: Normocephalic, atraumatic. Moist oral mucosal membranes  Eyes: Anicteric, PERRL  Neck: Supple, trachea midline  Lungs:  Clear to auscultation  Heart: Regular rate and rhythm  Abdomen:  Soft, nontender,   Extremities:  no peripheral edema.  Neurologic: Nonfocal, moving all four extremities  Skin: No lesions       Basic Metabolic Panel: Recent Labs  Lab 12/01/22 1635 12/02/22 0513 12/03/22 0210 12/04/22 0839 12/04/22 0840  NA 124* 124* 130* 133* 132*  K 4.1 4.3 3.9 3.6 3.6  CL 90* 91* 94* 102 101  CO2 24 24 22 25 24   GLUCOSE 263* 286* 221* 157* 154*  BUN 30* 33* 38* 21 21  CREATININE 1.38* 1.79* 2.10* 1.11 1.11  CALCIUM 8.8* 8.4* 8.4* 8.6* 8.5*  PHOS  --   --   --   --  2.4*    Liver Function Tests: Recent Labs  Lab 12/01/22 1933 12/04/22 0840  AST 17  --   ALT 26  --   ALKPHOS 114  --   BILITOT 1.3*  --   PROT 7.7  --   ALBUMIN 3.5 2.3*   No results for input(s): "LIPASE", "AMYLASE" in the last 168 hours. No results for input(s): "AMMONIA" in the last 168 hours.  CBC: Recent Labs  Lab 12/01/22 1635 12/02/22 0513 12/03/22 0210 12/03/22 0720 12/04/22 0839  WBC 13.5* 15.4* 14.5*   --  5.7  HGB 10.7* 10.9* 10.1* 9.7* 8.7*  HCT 31.3* 31.2* 30.2*  --  26.0*  MCV 84.6 83.2 84.6  --  84.7  PLT 262 268 254  --  227    Cardiac Enzymes: No results for input(s): "CKTOTAL", "CKMB", "CKMBINDEX", "TROPONINI" in the last 168 hours.  BNP: Invalid input(s): "POCBNP"  CBG: Recent Labs  Lab 12/03/22 1613 12/03/22 2115 12/04/22 0734 12/04/22 1239 12/04/22 1614  GLUCAP 231* 176* 147* 168* 127*    Microbiology: Results for orders placed or performed during the hospital encounter of 12/01/22  Blood Culture (routine x 2)     Status: None (Preliminary result)   Collection Time: 12/01/22  7:33 PM   Specimen: BLOOD  Result Value Ref Range Status   Specimen Description BLOOD BLOOD RIGHT ARM  Final   Special Requests   Final    BOTTLES DRAWN AEROBIC AND ANAEROBIC Blood Culture adequate volume   Culture   Final    NO GROWTH 3 DAYS Performed at [facility], [address], [place], [place] [address]    Report Status PENDING  Incomplete  Blood Culture (routine x 2)     Status: None (Preliminary result)   Collection Time: 12/01/22  7:33 PM  Specimen: BLOOD  Result Value Ref Range Status   Specimen Description BLOOD BLOOD LEFT ARM  Final   Special Requests   Final    BOTTLES DRAWN AEROBIC AND ANAEROBIC Blood Culture results may not be optimal due to an excessive volume of blood received in culture bottles   Culture   Final    NO GROWTH 3 DAYS Performed at [facility], [address] [place], [place] [address]    Report Status PENDING  Incomplete  Remove and replace urinary cath (placed > 5 days) then obtain urine culture from new indwelling urinary catheter.     Status: Abnormal   Collection Time: 12/01/22  7:33 PM   Specimen: Urine, Catheterized  Result Value Ref Range Status   Specimen Description   Final    URINE, CATHETERIZED Performed at [facility], [address] [place], [place] [address]    Special Requests   Final     NONE Performed at [facility], [address] [place], [place] [address]    Culture >=100,000 COLONIES/mL ESCHERICHIA COLI (A)  Final   Report Status 12/04/2022 FINAL  Final   Organism ID, Bacteria ESCHERICHIA COLI (A)  Final      Susceptibility   Escherichia coli - MIC*    AMPICILLIN >=32 RESISTANT Resistant     CEFAZOLIN <=4 SENSITIVE Sensitive     CEFEPIME <=0.12 SENSITIVE Sensitive     CEFTRIAXONE <=0.25 SENSITIVE Sensitive     CIPROFLOXACIN <=0.25 SENSITIVE Sensitive     GENTAMICIN <=1 SENSITIVE Sensitive     IMIPENEM <=0.25 SENSITIVE Sensitive     NITROFURANTOIN <=16 SENSITIVE Sensitive     TRIMETH/SULFA <=20 SENSITIVE Sensitive     AMPICILLIN/SULBACTAM 16 INTERMEDIATE Intermediate     PIP/TAZO <=4 SENSITIVE Sensitive     * >=100,000 COLONIES/mL ESCHERICHIA COLI  MRSA Next Gen by PCR, Nasal     Status: None   Collection Time: 12/02/22  8:42 AM   Specimen: Nasal Mucosa; Nasal Swab  Result Value Ref Range Status   MRSA by PCR Next Gen NOT DETECTED NOT DETECTED Final    Comment: (NOTE) The GeneXpert MRSA Assay (FDA approved for NASAL specimens only), is one component of a comprehensive MRSA colonization surveillance program. It is not intended to diagnose MRSA infection nor to guide or monitor treatment for MRSA infections. Test performance is not FDA approved in patients less than 34 years old. Performed at [facility], [address] [place], [place] [address]   C Difficile Quick Screen w PCR reflex     Status: None   Collection Time: 12/03/22 12:52 PM   Specimen: STOOL  Result Value Ref Range Status   C Diff antigen NEGATIVE NEGATIVE Final   C Diff toxin NEGATIVE NEGATIVE Final   C Diff interpretation No C. difficile detected.  Final    Comment: Performed at [facility], [address] [place], [place] [address]    Coagulation Studies: Recent Labs    12/01/22 1933  LABPROT 15.3*  INR 1.2    Urinalysis: Recent Labs     12/01/22 1933  COLORURINE AMBER*  LABSPEC 1.010  PHURINE 5.0  GLUCOSEU 150*  HGBUR LARGE*  BILIRUBINUR NEGATIVE  KETONESUR NEGATIVE  PROTEINUR 30*  NITRITE NEGATIVE  LEUKOCYTESUR LARGE*      Imaging: US RENAL  Result Date: 12/03/2022 CLINICAL DATA:  [id number] Acute kidney failure (HCC) 284132 EXAM: RENAL / URINARY TRACT ULTRASOUND COMPLETE COMPARISON:  CT 12/01/2022 FINDINGS: Right Kidney: Renal measurements: 15.0  x 6.3 x 5.5 cm = volume: 267.6 mL. Mild pelviectasis, decreased from prior CT. Normal echogenicity. There is a 2.2 cm cystic lesion in the upper pole, poorly visualized. Left Kidney: Renal measurements: 13.1 x 6.5 x 6.1 cm = volume: 271.8 mL. Mild pelviectasis, decreased from prior CT. Normal echogenicity. Bladder: Decompressed with Foley catheter in place. Other: Hepatic steatosis. IMPRESSION: Mild renal pelviectasis bilaterally. Bladder is decompressed with Foley catheter in place. Electronically Signed   By: Caprice Renshaw M.D.   On: 12/03/2022 11:58     Medications:    cefTRIAXone (ROCEPHIN)  IV Stopped (12/03/22 2147)   heparin 3,250 Units/hr (12/04/22 1033)    atorvastatin  40 mg Oral Daily   Chlorhexidine Gluconate Cloth  6 each Topical Daily   cyanocobalamin  1,000 mcg Oral Daily   DULoxetine  30 mg Oral Daily   fenofibrate  160 mg Oral Daily   gabapentin  800 mg Oral TID   insulin aspart  0-5 Units Subcutaneous QHS   insulin aspart  0-9 Units Subcutaneous TID WC   insulin glargine-yfgn  15 Units Subcutaneous Daily   pregabalin  75 mg Oral BID   tamsulosin  0.4 mg Oral Daily   acetaminophen **OR** acetaminophen, morphine injection, ondansetron **OR** ondansetron (ZOFRAN) IV, oxyCODONE, phenol  Assessment/ Plan:  Mr. PAWEL STVIL is a 72 y.o.  male with hypertension, diabetes mellitus type II, diabetic peripheral neuropathy, obstructive sleep apnea, hyperlipidemia, history of prostate cancer, history of spinal surgery, history of bladder neck stricture, who was  admitted to [facility] on 12/01/2022 for Complicated UTI (urinary tract infection) [N39.0] Catheter-associated urinary tract infection (HCC) [T83.511A, N39.0] Sepsis (HCC) [A41.9] Altered mental status, unspecified altered mental status type [R41.82] Other acute pulmonary embolism without acute cor pulmonale (HCC) [I26.99]   1.  Acute kidney injury secondary to sepsis and contrast exposure.  Creatinine back to baseline. Creatinine 1, GFR >60 on 11/17/22. Holding lisinopril, hydrochlorothiazide and metformin.    2.  Hyponatremia.  Was on HCTZ previously.  Do not recommend restarting at this time.    3.  Hematuria, proteinuria and E. Coli urinary tract infection.  On antibiotics: ceftriaxone.    LOS: 3 Fredric Slabach 6/1/20244:31 PM

## 2022-12-04 NOTE — Progress Notes (Signed)
ANTICOAGULATION CONSULT NOTE  Pharmacy Consult for heparin infusion Indication: pulmonary embolus  Allergies  Allergen Reactions   Dilaudid [Hydromorphone Hcl] Itching    Patient Measurements: Height: 5\' 8"  (172.7 cm) Weight: 113.5 kg (250 lb 3.6 oz) IBW/kg (Calculated) : 68.4 Heparin Dosing Weight: 94.7 kg  Vital Signs: Temp: 98.7 F (37.1 C) (06/01 0000) Temp Source: Oral (06/01 0000) BP: 120/62 (06/01 0000) Pulse Rate: 83 (06/01 0000)  Labs: Recent Labs    12/01/22 1635 12/01/22 1933 12/02/22 0513 12/02/22 1452 12/03/22 0210 12/03/22 0720 12/03/22 1336 12/03/22 1505 12/04/22 0015  HGB 10.7*  --  10.9*  --  10.1* 9.7*  --   --   --   HCT 31.3*  --  31.2*  --  30.2*  --   --   --   --   PLT 262  --  268  --  254  --   --   --   --   APTT  --  33  --   --   --   --   --   --   --   LABPROT  --  15.3*  --   --   --   --   --   --   --   INR  --  1.2  --   --   --   --   --   --   --   HEPARINUNFRC  --   --  <0.10*   < > <0.10*  --  <0.10* <0.10* 0.14*  CREATININE 1.38*  --  1.79*  --  2.10*  --   --   --   --    < > = values in this interval not displayed.     Estimated Creatinine Clearance: 40 mL/min (A) (by C-G formula based on SCr of 2.1 mg/dL (H)).   Medical History: Past Medical History:  Diagnosis Date   Arthritis    Diabetes mellitus without complication (HCC)    Hypertension    Prostate cancer (HCC)    Sleep apnea     Assessment: Pt is a 72 yo male presenting to ED d/t AMS, found with "multiple filling defects within right lower lobe pulmonary arterial branches compatible with multiple pulmonary emboli."  5/31 0210 HL <0.10 Subtherapeutic inc to 2550 u/hr 5/31 1336 HL <0.10 Subtherapeutic-  recheck 5/31 1505 HL <0.10 Subtherapeutic, will order bolus of 1400 units and increase to 2800 units/hr 6/01 0015 HL 0.14 subtherapeutic  Goal of Therapy:  Heparin level 0.3-0.7 units/ml Monitor platelets by anticoagulation protocol: Yes   Plan:   --Will give 1/2 bolus due to previous hematuria,  of 1400 units x 1 --Increase heparin infusion to 3050 --Recheck HL in 6 hrs after rate change -- CBC daily while on heparin  Otelia Sergeant, PharmD, [facility] 12/04/2022 1:23 AM

## 2022-12-04 NOTE — Evaluation (Signed)
Physical Therapy Evaluation Patient Details Name: Alexander Wilcox MRN: [id number] DOB: 1950-11-01 Today's Date: 12/04/2022  History of Present Illness  Alexander Wilcox is a 72 y/o M admitted on 12/01/22 after presenting from rehab with c/o AMS. Pt was dx with UTI 3 days prior. Pt is being treated for catheter associated UTI & possible sepsis. Pt also noted to have multiple PE in RLL, LLE DVT. Pt with recent hospitalizations 5/9-5/16 & underwent T7-12 fusion & bladder neck dilation for stricture with placement of foley; stay was complicated by acute neurological deficit with findings of stenosis of vertebral, internal carotid & basilar arteries. 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  Patient alert and oriented, follows commands and agreeable to participation with session; very eager and motivated to improve as able.  Endorses pain to back with movement efforts, 4/10 per FACES scale; responds well to intermittent rest periods with activity.  Generalized weakness throughout trunk and bilat LEs; significant weakness throughout L LE compared to R LE (see details below).  Currently requiring mod assist +2 for bed mobility; cga/min  progressing to close for unsupported sitting balance.  Demonstrates progressive R ant/lateral lean with fatigue and divided attention in sitting; does spontaneously correct 75% time.  Completes partial sit/stand from slightly elevated bed surface, requiring max cuing/faciltiation from therapist to stabilize L LE. Able to clear buttocks (with heavy dependence from bilat UEs) but unable to achieve full standing position.  Additional mobility/OOB efforts deferred this date due to generalized fatigue from evaluation. Will continue to assess/progress as  medically appropriate; anticipate need for heavy +2 with any OOB to chair attempts. Would benefit from skilled PT to address above deficits and promote optimal return to PLOF; recommend high-intensity, post-acute rehab services upon discharge from acute hospitalization.      Recommendations for follow up therapy are one component of a multi-disciplinary discharge planning process, led by the attending physician.  Recommendations may be updated based on patient status, additional functional criteria and insurance authorization.  Follow Up Recommendations Can patient physically be transported by private vehicle: No     Assistance Recommended at Discharge Frequent or constant Supervision/Assistance  Patient can return home with the following  Two people to help with walking and/or transfers;Two people to help with bathing/dressing/bathroom;Assistance with cooking/housework;Direct supervision/assist for medications management;Direct supervision/assist for financial management;Assist for transportation;Help with stairs or ramp for entrance    Equipment Recommendations    Recommendations for Other Services       Functional Status Assessment Patient has had a recent decline in their functional status and demonstrates the ability to make significant improvements in function in a reasonable and predictable amount of time.     Precautions / Restrictions Precautions Precautions: Back;Fall Precaution Booklet Issued: No Required Braces or Orthoses: Other Brace Other Brace: Per chart, pt had TLSO from past admission, pt/spouse state MD gave clearance to leave off for basic mobility. Restrictions Weight Bearing Restrictions: No      Mobility  Bed Mobility Overal bed mobility: Needs Assistance Bed Mobility: Supine to Sit, Sit to Supine Rolling: Mod assist   Supine to sit: Mod assist, +2 for physical assistance Sit to supine: +2 for physical assistance, Mod assist   General bed mobility  comments: HOB slightly elevated, utilized log-roll technique  Transfers Overall transfer level: Needs assistance Equipment used: Rolling walker (2 wheels) Transfers: Sit to/from Stand Sit to Stand: Max assist, +2 physical assistance           General transfer comment: Partial sit/stand from slightly elevated bed surface, requiring max cuing/faciltiation from therapist to stabilize L LE.  Able to clear buttocks (with heavy dependence from bilat UEs) but unable to achieve full standing position.    Ambulation/Gait               General Gait Details: unsafe/unable  Stairs            Wheelchair Mobility    Modified Rankin (Stroke Patients Only)       Balance Overall balance assessment: Needs assistance Sitting-balance support: No upper extremity supported, Feet supported Sitting balance-Leahy Scale: Fair Sitting balance - Comments: Able to progress to SUPERVISION for sitting balance without UE support during funcitonal task. Able to self-correct posture with min cueing.  Progressive R ant/lateral lean with fatigue and divided attention, does spontaneous correct 75% time. Withstands mild external perturbations with close sup Postural control: Right lateral lean   Standing balance-Leahy Scale: Zero Standing balance comment: Unable to come to full stand during session with supports and MAX A                             Pertinent Vitals/Pain Pain Assessment Pain Assessment: Faces Faces Pain Scale: Hurts little more Pain Location: back and LLE with mobility Pain Descriptors / Indicators: Aching, Grimacing Pain Intervention(s): Limited activity within patient's tolerance, Monitored during session, Repositioned    Home Living Family/patient expects to be discharged to:: Private residence Living Arrangements: Spouse/significant other Available Help at Discharge: Family;Available 24 hours/day Type of Home: House Home Access: Stairs to enter Entrance  Stairs-Rails: Right Entrance Stairs-Number of Steps: garage* 4-5   Home Layout: One level Home Equipment: Wheelchair - manual Additional Comments: Pt presents from STR, was at home and independent prior to initial admission in May.    Prior Function Prior Level of Function : Independent/Modified Independent             Mobility Comments: At baseline, indep with ADLs, household and community mobilization ([place] with wife, was mowing yard at initial injury).  Since injury, has been working with rehab to recover function, standing for brief periods in parallel bars in rehab (but using WC as primary mobility) ADLs Comments: Per previous documentation, Pt is retired from the [employer] 15 years ago. Used to enjoys motorcylcing. [place], [place], and remodeling his bathrooms.     Hand Dominance        Extremity/Trunk Assessment   Upper Extremity Assessment Upper Extremity Assessment: Overall WFL for tasks assessed (grossly 4-/5 throughout)    Lower Extremity Assessment Lower Extremity Assessment:  (R LE grossly 3 to 3+/5 throughout; L LE grossly 1 to 2-/5 for hip flex/abduct/adduct, 2+/5 for hip extension, 2+/5 L quads, 1/5 L hams, 3-/5 L ankle PF/DF.  Denies sensory deficit)    Cervical / Trunk Assessment Cervical / Trunk Assessment: Back Surgery  Communication   Communication: No difficulties  Cognition Arousal/Alertness: Awake/alert Behavior During Therapy: WFL for tasks assessed/performed Overall Cognitive Status: Within Functional Limits for tasks assessed  General Comments: Motivated to participate/progress as able        General Comments General comments (skin integrity, edema, etc.): Pt surgical site on back noted with ~1.5" length of partial thickness opening. Appears clean and dry with no drainage appreciated. RN notified and in room to assess and cover  during session.    Exercises Other Exercises Other Exercises: Lateral scooting edge of bed, mod assist +2 for lift and lateral shift   Assessment/Plan    PT Assessment Patient needs continued PT services  PT Problem List Decreased strength;Decreased range of motion;Decreased activity tolerance;Decreased balance;Decreased mobility;Decreased safety awareness;Pain;Obesity;Decreased knowledge of use of DME;Decreased knowledge of precautions       PT Treatment Interventions DME instruction;Gait training;Stair training;Functional mobility training;Therapeutic activities;Therapeutic exercise;Balance training;Neuromuscular re-education;Patient/family education    PT Goals (Current goals can be found in the Care Plan section)  Acute Rehab PT Goals Patient Stated Goal: "get better" PT Goal Formulation: With patient/family Time For Goal Achievement: 12/18/22 Potential to Achieve Goals: Good    Frequency Min 4X/week     Co-evaluation PT/OT/SLP Co-Evaluation/Treatment: Yes Reason for Co-Treatment: Complexity of the patient's impairments (multi-system involvement);For patient/therapist safety PT goals addressed during session: Mobility/safety with mobility;Balance;Strengthening/ROM;Proper use of DME OT goals addressed during session: ADL's and self-care;Proper use of Adaptive equipment and DME       AM-PAC PT "6 Clicks" Mobility  Outcome Measure Help needed turning from your back to your side while in a flat bed without using bedrails?: A Lot Help needed moving from lying on your back to sitting on the side of a flat bed without using bedrails?: A Lot Help needed moving to and from a bed to a chair (including a wheelchair)?: Total Help needed standing up from a chair using your arms (e.g., wheelchair or bedside chair)?: Total Help needed to walk in hospital room?: Total Help needed climbing 3-5 steps with a railing? : Total 6 Click Score: 8    End of Session   Activity Tolerance:  Patient tolerated treatment well Patient left: with bed alarm set;with call bell/phone within reach;with family/visitor present Nurse Communication:  (small open area over incision) PT Visit Diagnosis: Unsteadiness on feet (R26.81);Muscle weakness (generalized) (M62.81);Difficulty in walking, not elsewhere classified (R26.2);Pain    Time: 1110-1147 PT Time Calculation (min) (ACUTE ONLY): 37 min   Charges:   PT Evaluation $PT Eval Moderate Complexity: 1 Mod         Atina Feeley H. Manson Passey, PT, DPT, NCS 12/04/22, 1:18 PM [phone]

## 2022-12-04 NOTE — Plan of Care (Signed)
Continuing with plan of care. 

## 2022-12-04 NOTE — Progress Notes (Signed)
Inpatient Rehab Admissions Coordinator Note:   Per therapy patient was screened for CIR candidacy by Keyaan Lederman Luvenia Starch, CCC-SLP. At this time, pt appears to be a potential candidate for CIR. I will place an order for rehab consult for full assessment, per our protocol.  Please contact me any with questions.Wolfgang Phoenix, MS, CCC-SLP Admissions Coordinator [phone] 12/04/22 5:26 PM

## 2022-12-04 NOTE — Progress Notes (Signed)
[facility]  - [place] at [facility]   PATIENT NAME: Alexander Wilcox    MR#:  [id number]  DATE OF BIRTH:  [birth year]  SUBJECTIVE:  wife  at bedside.  Overall doing much better. Good urine output. No fever. Tolerating PO diet. Blood pressure much improved. VITALS:  Blood pressure 130/63, pulse 78, temperature 97.7 F (36.5 C), resp. rate 18, height 5\' 8"  (1.727 m), weight 113.5 kg, SpO2 100 %.  PHYSICAL EXAMINATION:   GENERAL:  72 y.o.-year-old patient with no acute distress. Obese LUNGS: Normal breath sounds bilaterally, no wheezing CARDIOVASCULAR: S1, S2 normal. No murmur tachycardia ABDOMEN: Soft, nontender, nondistended. Bowel sounds present. Chronic Foley catheter with yellow urine EXTREMITIES: No  edema b/l.    NEUROLOGIC: nonfocal  patient is oriented to time place person   LABORATORY PANEL:  CBC Recent Labs  Lab 12/04/22 0839  WBC 5.7  HGB 8.7*  HCT 26.0*  PLT 227     Chemistries  Recent Labs  Lab 12/01/22 1933 12/02/22 0513 12/04/22 0840  NA  --    < > 132*  K  --    < > 3.6  CL  --    < > 101  CO2  --    < > 24  GLUCOSE  --    < > 154*  BUN  --    < > 21  CREATININE  --    < > 1.11  CALCIUM  --    < > 8.5*  AST 17  --   --   ALT 26  --   --   ALKPHOS 114  --   --   BILITOT 1.3*  --   --    < > = values in this interval not displayed.    Cardiac Enzymes No results for input(s): "TROPONINI" in the last 168 hours. RADIOLOGY:  US RENAL  Result Date: 12/03/2022 CLINICAL DATA:  [id number] Acute kidney failure (HCC) [id number] EXAM: RENAL / URINARY TRACT ULTRASOUND COMPLETE COMPARISON:  CT 12/01/2022 FINDINGS: Right Kidney: Renal measurements: 15.0 x 6.3 x 5.5 cm = volume: 267.6 mL. Mild pelviectasis, decreased from prior CT. Normal echogenicity. There is a 2.2 cm cystic lesion in the upper pole, poorly visualized. Left Kidney: Renal measurements: 13.1 x 6.5 x 6.1 cm = volume: 271.8 mL. Mild pelviectasis, decreased from prior CT. Normal  echogenicity. Bladder: Decompressed with Foley catheter in place. Other: Hepatic steatosis. IMPRESSION: Mild renal pelviectasis bilaterally. Bladder is decompressed with Foley catheter in place. Electronically Signed   By: Caprice Renshaw M.D.   On: 12/03/2022 11:58    Assessment and Plan    Alexander Wilcox is a 72 y.o. 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 Patient went to rehab with Foley catheter. He was seen on follow-up with urology on 21st May and decision was to continue Foley catheter for now since patient was getting rehab and be removed on December 04, 2022.  Patient came in with increasing confusion, hematuria, tachycardic and was admitted with sepsis suspected due to UTI in the setting of Foley catheter. Patient was also noted to have multiple pulmonary emboli on CT scan. Currently on  IV heparin drip.  CT head nonacute CT abdomen and pelvis showing cystitis and multilevel severe degenerative changes of the spine as well as evidence of pulmonary emboli CTA chest confirms multiple segmental and subsegmental filling defects in the right lower lobe compatible with multiple pulmonary emboli without evidence of right heart strain.  Catheter-associated urinary tract infection (HCC) History of bladder neck stricture requiring dilatation 11/12/2022 with indwelling Foley placed in the OR on 5/10 Prostate cancer on leuprolide Severe Sepsis --Patient presents with weakness, altered mental status in the setting of UTI on outpatient antibiotics.  Has tachycardia, leukocytosis with normal lactic acid.  AKI, altered mental  status/metabolic encephalopathy --Sepsis fluids --Blood culture neg --UC >100K E coli --Foley was exchanged in the ED on 12/01/2022 -- infectious disease consultation with Dr. Quincy Simmonds Rocephin -- sepsis improving   Multiple pulmonary emboli (HCC) Left Posterior tibial vein DVT --Continue heparin infusion --Provoked PE from recent surgery, immobilization --Supplemental oxygen if needed --Incentive spirometry --[place] LE noted to have left pot tibial vein DVT   Acute metabolic encephalopathy/Delirium --Secondary to UTI and PE --CT head nonacute --Aspiration precautions   AKI (acute kidney injury) (HCC)/Hyponatremia --Creatinine 1.38--1.79--2.10--1.1 --baseline of 1.0 --Suspecting related to dehydration from poor oral intake --Will hold lisinopril, HCTZ, metformin.  Avoid nephrotoxins -- nephrology consultation with Dr. Cherylann Ratel --sodium 124--130--132   S/P fusion of thoracic spine 11/12/22 for traumatic T9 fracture History of failed back syndrome with prior cervical and lumbar fusions --Evaluated by Dr. Marcell Barlow in the ED --Continue multimodal pain control with gabapentin, duloxetine and narcotics as needed once mentation better   Hyponatremia Suspect dehydration from poor oral intake --As above   Uncontrolled type 2 diabetes mellitus with hyperglycemia, with long-term current use of insulin (HCC) --Blood sugar 263 ---Continue basal insulin -Sliding scale insulin coverage   Carotid artery stenosis Possible prior TIA 11/12/2022 -CTA 11/12/22 showed Occluded right vertebral artery ...,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 -Hold aspirin while on systemic anticoagulation - Continue atorvastatin and fenofibrate   Frailty Patient has recent prolonged hospitalization, recent back surgery with chronic comorbidities and current acute illnesses Currently at SNF s/p  recent hospitalization from 5/10 to 5/16 PT recommendations appreciated. TOC for discharge planning. Given recent back surgery with neural deficit left lower extremity and complication with hematuria and PE patient will benefit from inpatient rehab.   OSA on CPAP Continue CPAP   Essential hypertension Continue metoprolol As needed hydralazine Will hold lisinopril HCTZ given hyponatremia and AKI     DVT prophylaxis: Heparin Code status: full Family communication :wife [name] at bedside Consults : ID, neurosurgery, nephrology  Level of care: Med-Surg Status is: Inpatient Remains inpatient appropriate because: patient is critically ill sepsis, Encephalopathy, UTI, PE  Transfer of ICU    TOTAL critical TIME TAKING CARE OF THIS PATIENT: 35 minutes.  >50% time spent on counselling and coordination of care  Note: This dictation was prepared with Dragon dictation along with smaller phrase technology. Any transcriptional errors that result from this process are unintentional.  Enedina Finner M.D    [facility]   CC: Primary care physician; Marisue Ivan, MD

## 2022-12-04 NOTE — Progress Notes (Signed)
ANTICOAGULATION CONSULT NOTE  Pharmacy Consult for heparin infusion Indication: pulmonary embolus  Allergies  Allergen Reactions   Dilaudid [Hydromorphone Hcl] Itching    Patient Measurements: Height: 5\' 8"  (172.7 cm) Weight: 113.5 kg (250 lb 3.6 oz) IBW/kg (Calculated) : 68.4 Heparin Dosing Weight: 94.7 kg  Vital Signs: Temp: 97.7 F (36.5 C) (06/01 1103) Temp Source: Oral (06/01 0800) BP: 130/63 (06/01 1103) Pulse Rate: 78 (06/01 1103)  Labs: Recent Labs     0000 12/01/22 1933 12/02/22 0513 12/02/22 1452 12/03/22 0210 12/03/22 0720 12/03/22 1336 12/04/22 0015 12/04/22 0839 12/04/22 0840 12/04/22 1828  HGB   < >  --  10.9*  --  10.1* 9.7*  --   --  8.7*  --   --   HCT  --   --  31.2*  --  30.2*  --   --   --  26.0*  --   --   PLT  --   --  268  --  254  --   --   --  227  --   --   APTT  --  33  --   --   --   --   --   --   --   --   --   LABPROT  --  15.3*  --   --   --   --   --   --   --   --   --   INR  --  1.2  --   --   --   --   --   --   --   --   --   HEPARINUNFRC  --   --  <0.10*   < > <0.10*  --    < > 0.14* 0.21*  --  0.32  CREATININE   < >  --  1.79*  --  2.10*  --   --   --  1.11 1.11  --    < > = values in this interval not displayed.     Estimated Creatinine Clearance: 75.7 mL/min (by C-G formula based on SCr of 1.11 mg/dL).   Medical History: Past Medical History:  Diagnosis Date   Arthritis    Diabetes mellitus without complication (HCC)    Hypertension    Prostate cancer (HCC)    Sleep apnea     Assessment: Pt is a 72 yo male presenting to ED d/t AMS, found with "multiple filling defects within right lower lobe pulmonary arterial branches compatible with multiple pulmonary emboli."  Goal of Therapy:  Heparin level 0.3-0.7 units/ml Monitor platelets by anticoagulation protocol: Yes   Plan: heparin level 0.32 is therapeutic x 1  --Continue heparin infusion at 3250 --Recheck heparin level in 8 hrs for confirmation -- CBC daily  while on heparin  Clovia Cuff, PharmD, BCPS 12/04/2022 7:01 PM

## 2022-12-04 NOTE — Progress Notes (Signed)
Patient received from ICU via bed. Accompanied by ICU RN and wife. Heparin drip infusing.

## 2022-12-05 DIAGNOSIS — G9341 Metabolic encephalopathy: Secondary | ICD-10-CM | POA: Diagnosis not present

## 2022-12-05 DIAGNOSIS — N179 Acute kidney failure, unspecified: Secondary | ICD-10-CM | POA: Diagnosis not present

## 2022-12-05 DIAGNOSIS — N320 Bladder-neck obstruction: Secondary | ICD-10-CM | POA: Diagnosis not present

## 2022-12-05 DIAGNOSIS — N390 Urinary tract infection, site not specified: Secondary | ICD-10-CM | POA: Diagnosis not present

## 2022-12-05 LAB — CBC
HCT: 23.4 % — ABNORMAL LOW (ref 39.0–52.0)
Hemoglobin: 7.9 g/dL — ABNORMAL LOW (ref 13.0–17.0)
MCH: 28.5 pg (ref 26.0–34.0)
MCHC: 33.8 g/dL (ref 30.0–36.0)
MCV: 84.5 fL (ref 80.0–100.0)
Platelets: 204 10*3/uL (ref 150–400)
RBC: 2.77 MIL/uL — ABNORMAL LOW (ref 4.22–5.81)
RDW: 13 % (ref 11.5–15.5)
WBC: 3.7 10*3/uL — ABNORMAL LOW (ref 4.0–10.5)
nRBC: 0 % (ref 0.0–0.2)

## 2022-12-05 LAB — GLUCOSE, CAPILLARY
Glucose-Capillary: 147 mg/dL — ABNORMAL HIGH (ref 70–99)
Glucose-Capillary: 155 mg/dL — ABNORMAL HIGH (ref 70–99)
Glucose-Capillary: 173 mg/dL — ABNORMAL HIGH (ref 70–99)
Glucose-Capillary: 134 mg/dL — ABNORMAL HIGH (ref 70–99)

## 2022-12-05 LAB — CULTURE, BLOOD (ROUTINE X 2): Culture: NO GROWTH

## 2022-12-05 LAB — HEPARIN LEVEL (UNFRACTIONATED): Heparin Unfractionated: 0.33 IU/mL (ref 0.30–0.70)

## 2022-12-05 MED ORDER — APIXABAN 5 MG PO TABS
10.0000 mg | ORAL_TABLET | Freq: Two times a day (BID) | ORAL | Status: DC
  Administered 2022-12-05 – 2022-12-07 (×5): 10 mg via ORAL
  Filled 2022-12-05 (×6): qty 2

## 2022-12-05 MED ORDER — ORAL CARE MOUTH RINSE: 15.0000 mL | OROMUCOSAL | Status: DC | PRN

## 2022-12-05 MED ORDER — APIXABAN 5 MG PO TABS: 5.0000 mg | ORAL_TABLET | Freq: Two times a day (BID) | ORAL | Status: DC

## 2022-12-05 NOTE — Progress Notes (Signed)
[facility]  - [place] at [facility]   PATIENT NAME: Alexander Wilcox    MR#:  [id number]  DATE OF BIRTH:  Nov 28, 1950  SUBJECTIVE:  friend at bedside.  Overall doing much better. Good urine output.urine cola colored  No fever. Tolerating PO diet. Blood pressure much improved. VITALS:  Blood pressure (!) 130/56, pulse 80, temperature 98 F (36.7 C), resp. rate 18, height 5\' 8"  (1.727 m), weight 113.5 kg, SpO2 100 %.  PHYSICAL EXAMINATION:   GENERAL:  72 y.o.-year-old patient with no acute distress. Obese LUNGS: Normal breath sounds bilaterally, no wheezing CARDIOVASCULAR: S1, S2 normal. No murmur tachycardia ABDOMEN: Soft, nontender, nondistended. Bowel sounds present. Chronic Foley catheter with dark urine EXTREMITIES: No  edema b/l.    NEUROLOGIC: nonfocal  patient is oriented to time place person   LABORATORY PANEL:  CBC Recent Labs  Lab 12/05/22 0154  WBC 3.7*  HGB 7.9*  HCT 23.4*  PLT 204     Chemistries  Recent Labs  Lab 12/01/22 1933 12/02/22 0513 12/04/22 0840  NA  --    < > 132*  K  --    < > 3.6  CL  --    < > 101  CO2  --    < > 24  GLUCOSE  --    < > 154*  BUN  --    < > 21  CREATININE  --    < > 1.11  CALCIUM  --    < > 8.5*  AST 17  --   --   ALT 26  --   --   ALKPHOS 114  --   --   BILITOT 1.3*  --   --    < > = values in this interval not displayed.    Cardiac Enzymes No results for input(s): "TROPONINI" in the last 168 hours. RADIOLOGY:  No results found.  Assessment and Plan    Alexander Wilcox is a 72 y.o. 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 Patient went to rehab with Foley catheter. He was seen on follow-up with urology on 21st May and decision was to continue Foley catheter for now since patient was getting rehab and be removed on December 04, 2022.  Patient came in with increasing confusion, hematuria, tachycardic and was admitted with sepsis suspected due to UTI in the setting of Foley catheter. Patient was also noted to have multiple pulmonary emboli on CT scan. Currently on IV heparin drip.  CT head nonacute CT abdomen and pelvis showing cystitis and multilevel severe degenerative changes of the spine as well as evidence of pulmonary emboli CTA chest confirms multiple segmental and subsegmental filling defects in the right lower lobe compatible with multiple pulmonary emboli without evidence of right heart strain.  Catheter-associated urinary tract infection (HCC) History of bladder neck stricture requiring dilatation 11/12/2022 with indwelling Foley placed in the OR on 5/10 Prostate cancer on leuprolide Severe Sepsis --Patient presents with weakness, altered mental status in the setting of UTI on outpatient antibiotics.  Has tachycardia, leukocytosis with normal lactic acid.  AKI, altered mental status/metabolic encephalopathy --Sepsis fluids --Blood culture neg --UC >  100K E coli --Foley was exchanged in the ED on 12/01/2022 -- infectious disease consultation with Dr. Quincy Simmonds Rocephin -- sepsis improving   Multiple pulmonary emboli (HCC) Left Posterior tibial vein DVT --Continue heparin infusion --Provoked PE from recent surgery, immobilization --Supplemental oxygen if needed --Incentive spirometry --[place] LE noted to have left pot tibial vein DVT   Acute metabolic encephalopathy/Delirium --Secondary to UTI and PE --CT head nonacute --Aspiration precautions   AKI (acute kidney injury) (HCC)/Hyponatremia --Creatinine 1.38--1.79--2.10--1.1 --baseline of 1.0 --Suspecting related to dehydration from poor  oral intake --Will hold lisinopril, HCTZ, metformin.  Avoid nephrotoxins -- nephrology consultation with Dr. Cherylann Ratel --sodium 124--130--132   S/P fusion of thoracic spine 11/12/22 for traumatic T9 fracture History of failed back syndrome with prior cervical and lumbar fusions --Evaluated by Dr. Marcell Barlow in the ED --Continue multimodal pain control with gabapentin, duloxetine and narcotics as needed once mentation better  Acute blood loss anemia due to hematuria with UTI --baseline hgb 15.9--7.9 --will give BT as needed. Pt agreeable  Hyponatremia Suspect dehydration from poor oral intake --As above --improved sodium   Uncontrolled type 2 diabetes mellitus with hyperglycemia, with long-term current use of insulin (HCC) --Blood sugar 263 ---Continue basal insulin --Sliding scale insulin coverage   Carotid artery stenosis Possible prior TIA 11/12/2022 -CTA 11/12/22 showed Occluded right vertebral artery ...,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 -Hold aspirin while on systemic anticoagulation - Continue atorvastatin and fenofibrate   Frailty/Debility Patient has recent prolonged hospitalization, recent back surgery with chronic comorbidities and current acute illnesses Currently at SNF s/p recent hospitalization from 5/10 to 5/16 PT recommendations appreciated. TOC for discharge planning. Given recent back surgery with neuro deficit left lower extremity and complication with hematuria and PE patient will benefit from inpatient rehab.   OSA on CPAP Continue CPAP   Essential hypertension Continue metoprolol As needed hydralazine Will hold lisinopril HCTZ given hyponatremia and AKI     DVT prophylaxis: Heparin Code status: full Family communication :wife [name] on the phone Consults : ID, neurosurgery, nephrology  Level of care: Med-Surg Status is:  Inpatient Remains inpatient appropriate because: patient is critically ill sepsis, Encephalopathy, UTI, PE     TOTAL critical TIME TAKING CARE OF THIS PATIENT: 35 minutes.  >50% time spent on counselling and coordination of care  Note: This dictation was prepared with Dragon dictation along with smaller phrase technology. Any transcriptional errors that result from this process are unintentional.  Enedina Finner M.D    [facility]   CC: Primary care physician; Marisue Ivan, MD

## 2022-12-05 NOTE — Progress Notes (Signed)
ANTICOAGULATION CONSULT NOTE  Pharmacy Consult for heparin infusion Indication: pulmonary embolus  Allergies  Allergen Reactions   Dilaudid [Hydromorphone Hcl] Itching    Patient Measurements: Height: 5\' 8"  (172.7 cm) Weight: 113.5 kg (250 lb 3.6 oz) IBW/kg (Calculated) : 68.4 Heparin Dosing Weight: 94.7 kg  Vital Signs: Temp: 97.8 F (36.6 C) (06/01 2012) BP: 112/55 (06/01 2012) Pulse Rate: 78 (06/01 2012)  Labs: Recent Labs    12/03/22 0210 12/03/22 0720 12/04/22 0839 12/04/22 0840 12/04/22 1828 12/05/22 0154  HGB 10.1*   < > 8.7*  --   --  7.9*  HCT 30.2*  --  26.0*  --   --  23.4*  PLT 254  --  227  --   --  204  HEPARINUNFRC <0.10*   < > 0.21*  --  0.32 0.33  CREATININE 2.10*  --  1.11 1.11  --   --    < > = values in this interval not displayed.     Estimated Creatinine Clearance: 75.7 mL/min (by C-G formula based on SCr of 1.11 mg/dL).   Medical History: Past Medical History:  Diagnosis Date   Arthritis    Diabetes mellitus without complication (HCC)    Hypertension    Prostate cancer (HCC)    Sleep apnea     Assessment: Pt is a 72 yo male presenting to ED d/t AMS, found with "multiple filling defects within right lower lobe pulmonary arterial branches compatible with multiple pulmonary emboli."  Goal of Therapy:  Heparin level 0.3-0.7 units/ml Monitor platelets by anticoagulation protocol: Yes   Plan: heparin level 0.33 is therapeutic x 2  --Continue heparin infusion at 3250 --Recheck heparin level daily w/ AM labs while therapeutic -- CBC daily while on heparin  Otelia Sergeant, PharmD, [facility] 12/05/2022 2:56 AM

## 2022-12-06 ENCOUNTER — Other Ambulatory Visit (HOSPITAL_COMMUNITY): Payer: Self-pay

## 2022-12-06 ENCOUNTER — Ambulatory Visit: Payer: Medicare HMO | Admitting: Physician Assistant

## 2022-12-06 DIAGNOSIS — B9620 Unspecified Escherichia coli [E. coli] as the cause of diseases classified elsewhere: Secondary | ICD-10-CM | POA: Diagnosis not present

## 2022-12-06 DIAGNOSIS — N179 Acute kidney failure, unspecified: Secondary | ICD-10-CM | POA: Diagnosis not present

## 2022-12-06 DIAGNOSIS — N390 Urinary tract infection, site not specified: Secondary | ICD-10-CM | POA: Diagnosis not present

## 2022-12-06 DIAGNOSIS — G9341 Metabolic encephalopathy: Secondary | ICD-10-CM | POA: Diagnosis not present

## 2022-12-06 DIAGNOSIS — N320 Bladder-neck obstruction: Secondary | ICD-10-CM | POA: Diagnosis not present

## 2022-12-06 LAB — GLUCOSE, CAPILLARY
Glucose-Capillary: 140 mg/dL — ABNORMAL HIGH (ref 70–99)
Glucose-Capillary: 180 mg/dL — ABNORMAL HIGH (ref 70–99)
Glucose-Capillary: 168 mg/dL — ABNORMAL HIGH (ref 70–99)
Glucose-Capillary: 127 mg/dL — ABNORMAL HIGH (ref 70–99)

## 2022-12-06 LAB — CBC
HCT: 27.3 % — ABNORMAL LOW (ref 39.0–52.0)
Hemoglobin: 9.1 g/dL — ABNORMAL LOW (ref 13.0–17.0)
MCH: 28.2 pg (ref 26.0–34.0)
MCHC: 33.3 g/dL (ref 30.0–36.0)
MCV: 84.5 fL (ref 80.0–100.0)
Platelets: 202 10*3/uL (ref 150–400)
RBC: 3.23 MIL/uL — ABNORMAL LOW (ref 4.22–5.81)
RDW: 12.9 % (ref 11.5–15.5)
WBC: 2.7 10*3/uL — ABNORMAL LOW (ref 4.0–10.5)
nRBC: 0 % (ref 0.0–0.2)

## 2022-12-06 LAB — CULTURE, BLOOD (ROUTINE X 2)
Culture: NO GROWTH
Special Requests: ADEQUATE

## 2022-12-06 MED ORDER — NORTRIPTYLINE HCL 25 MG PO CAPS
50.0000 mg | ORAL_CAPSULE | Freq: Every day | ORAL | Status: DC
  Administered 2022-12-06: 50 mg via ORAL
  Filled 2022-12-06: qty 2

## 2022-12-06 NOTE — Progress Notes (Signed)
Physical Therapy Treatment Patient Details Name: Alexander Wilcox MRN: [id number] DOB: 05-Jul-1951 Today's Date: 12/06/2022   History of Present Illness Alexander Wilcox is a 72 y/o M admitted on 12/01/22 after presenting from rehab with c/o AMS. Pt was dx with UTI 3 days prior. Pt is being treated for catheter associated UTI & possible sepsis. Pt also noted to have multiple PE in RLL, LLE DVT. Pt with recent hospitalizations 5/9-5/16 & underwent T7-12 fusion & bladder neck dilation for stricture with placement of foley; stay was complicated by acute neurological deficit with findings of stenosis of vertebral, internal carotid & basilar arteries. 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    PT Comments    Pt resting in bed upon PT arrival; pt's wife present during session; pt agreeable for therapy.  During session pt mod assist with bed mobility and 2 assist to stand from mildly elevated bed height up to RW.  L knee blocked during standing activities.  5-6/10 back pain reported during session (nurse notified of pt's request for pain meds).  Will continue to focus on strengthening and progressive functional mobility per pt tolerance.   Recommendations for follow up therapy are one component of a multi-disciplinary discharge planning process, led by the attending physician.  Recommendations may be updated based on patient status, additional functional criteria and insurance authorization.  Follow Up Recommendations  Can patient physically be transported by private vehicle: No    Assistance Recommended at Discharge Frequent or constant Supervision/Assistance  Patient can return home with the following Two people to help with walking and/or transfers;Two people to help with bathing/dressing/bathroom;Assistance with cooking/housework;Direct supervision/assist for medications management;Direct  supervision/assist for financial management;Assist for transportation;Help with stairs or ramp for entrance   Equipment Recommendations  Other (comment) (TBD at next facility)    Recommendations for Other Services       Precautions / Restrictions Precautions Precautions: Back;Fall Required Braces or Orthoses: Other Brace Other Brace: Per chart, pt had TLSO from past admission, pt/spouse state MD gave clearance to leave off for basic mobility.     Mobility  Bed Mobility Overal bed mobility: Needs Assistance Bed Mobility: Rolling, Sidelying to Sit, Sit to Sidelying Rolling: Supervision Sidelying to sit: Mod assist (assist for trunk)     Sit to sidelying:  Mod assist (assist for B LE's) General bed mobility comments: utilized log-roll technique    Transfers Overall transfer level: Needs assistance Equipment used: Rolling walker (2 wheels) Transfers: Sit to/from Stand Sit to Stand: Min assist, +2 safety/equipment, +2 physical assistance           General transfer comment: L knee blocked; x3 trials standing from mildly elevated bed height up to RW; CGA plus min assist x 2 for 1st and 2nd trials; min assist x2 3rd trial    Ambulation/Gait                   Stairs             Wheelchair Mobility    Modified Rankin (Stroke Patients Only)       Balance Overall balance assessment: Needs assistance Sitting-balance support: No upper extremity supported, Feet supported Sitting balance-Leahy Scale: Good Sitting balance - Comments: steady sitting reaching within BOS   Standing balance support: Bilateral upper extremity supported, Reliant on assistive device for balance   Standing balance comment: heavy UE support through RW for static standing  balance with L knee blocked                            Cognition Arousal/Alertness: Awake/alert Behavior During Therapy: WFL for tasks assessed/performed Overall Cognitive Status: Within Functional  Limits for tasks assessed                                          Exercises  X10 (in standing) B quad sets (B UE support on RW) with 2 assist for safety    General Comments        Pertinent Vitals/Pain Pain Assessment Pain Assessment: 0-10 Pain Score: 6  Pain Location: back Pain Descriptors / Indicators: Aching Pain Intervention(s): Limited activity within patient's tolerance, Monitored during session, Repositioned, Patient requesting pain meds-RN notified    Home Living                          Prior Function            PT Goals (current goals can now be found in the care plan section) Acute Rehab PT Goals Patient Stated Goal: "get better" PT Goal Formulation: With patient/family Time For Goal Achievement: 12/18/22 Potential to Achieve Goals: Good Progress towards PT goals: Progressing toward goals    Frequency    Min 4X/week      PT Plan Current plan remains appropriate    Co-evaluation              AM-PAC PT "6 Clicks" Mobility   Outcome Measure  Help needed turning from your back to your side while in a flat bed without using bedrails?: A Little Help needed moving from lying on your back to sitting on the side of a flat bed without using bedrails?: A Lot Help needed moving to and from a bed to a chair (including a wheelchair)?: Total Help needed standing up from a chair using your arms (e.g., wheelchair or bedside chair)?: Total Help needed to walk in hospital room?: Total Help needed climbing 3-5 steps with a railing? : Total 6 Click Score: 9    End of Session Equipment Utilized During Treatment: Gait belt Activity Tolerance: Patient tolerated treatment well Patient left: in bed;with call bell/phone within reach;with bed alarm set;with family/visitor present Nurse Communication: Mobility status;Precautions;Patient requests pain meds PT Visit Diagnosis: Unsteadiness on feet (R26.81);Muscle weakness (generalized)  (M62.81);Difficulty in walking, not elsewhere classified (R26.2);Pain     Time: 1545-1610 PT Time Calculation (min) (ACUTE ONLY): 25 min  Charges:  $Therapeutic Activity: 23-37 mins                     Hendricks Limes, PT 12/06/22, 4:39 PM

## 2022-12-06 NOTE — Progress Notes (Addendum)
[facility]  ROUNDING NOTE   Subjective:   Patient seen laying in bed No family at bedside States he feels well Room air   UOP 3.55L  Objective:  Vital signs in last 24 hours:  Temp:  [98 F (36.7 C)-98.3 F (36.8 C)] 98.3 F (36.8 C) (06/03 0719) Pulse Rate:  [71-82] 71 (06/03 0719) Resp:  [16-18] 16 (06/03 0719) BP: (123-147)/(58-67) 147/67 (06/03 0719) SpO2:  [98 %-100 %] 98 % (06/03 0719)  Weight change:  Filed Weights   12/01/22 1633 12/03/22 0332  Weight: 116.1 kg 113.5 kg    Intake/Output: I/O last 3 completed shifts: In: 1039.4 [P.O.:720; I.V.:319.4] Out: 4975 [Urine:4975]   Intake/Output this shift:  Total I/O In: 180 [P.O.:180] Out: -   Physical Exam: General: NAD, laying in bed  Head: Normocephalic, atraumatic. Moist oral mucosal membranes  Eyes: Anicteric  Lungs:  Clear to auscultation  Heart: Regular rate and rhythm  Abdomen:  Soft, nontender,   Extremities:  no peripheral edema.  Neurologic: Nonfocal, moving all four extremities  Skin: No lesions       Basic Metabolic Panel: Recent Labs  Lab 12/01/22 1635 12/02/22 0513 12/03/22 0210 12/04/22 0839 12/04/22 0840  NA 124* 124* 130* 133* 132*  K 4.1 4.3 3.9 3.6 3.6  CL 90* 91* 94* 102 101  CO2 24 24 22 25 24   GLUCOSE 263* 286* 221* 157* 154*  BUN 30* 33* 38* 21 21  CREATININE 1.38* 1.79* 2.10* 1.11 1.11  CALCIUM 8.8* 8.4* 8.4* 8.6* 8.5*  PHOS  --   --   --   --  2.4*     Liver Function Tests: Recent Labs  Lab 12/01/22 1933 12/04/22 0840  AST 17  --   ALT 26  --   ALKPHOS 114  --   BILITOT 1.3*  --   PROT 7.7  --   ALBUMIN 3.5 2.3*    No results for input(s): "LIPASE", "AMYLASE" in the last 168 hours. No results for input(s): "AMMONIA" in the last 168 hours.  CBC: Recent Labs  Lab 12/02/22 0513 12/03/22 0210 12/03/22 0720 12/04/22 0839 12/05/22 0154 12/06/22 0433  WBC 15.4* 14.5*  --  5.7 3.7* 2.7*  HGB 10.9* 10.1* 9.7* 8.7* 7.9* 9.1*  HCT 31.2*  30.2*  --  26.0* 23.4* 27.3*  MCV 83.2 84.6  --  84.7 84.5 84.5  PLT 268 254  --  227 204 202     Cardiac Enzymes: No results for input(s): "CKTOTAL", "CKMB", "CKMBINDEX", "TROPONINI" in the last 168 hours.  BNP: Invalid input(s): "POCBNP"  CBG: Recent Labs  Lab 12/05/22 1136 12/05/22 1706 12/05/22 2121 12/06/22 0719 12/06/22 1153  GLUCAP 155* 173* 134* 140* 180*     Microbiology: Results for orders placed or performed during the hospital encounter of 12/01/22  Blood Culture (routine x 2)     Status: None   Collection Time: 12/01/22  7:33 PM   Specimen: BLOOD  Result Value Ref Range Status   Specimen Description BLOOD BLOOD RIGHT ARM  Final   Special Requests   Final    BOTTLES DRAWN AEROBIC AND ANAEROBIC Blood Culture adequate volume   Culture   Final    NO GROWTH 5 DAYS Performed at [facility], [address] [place], [place] [address]    Report Status 12/06/2022 FINAL  Final  Blood Culture (routine x 2)     Status: None   Collection Time: 12/01/22  7:33 PM   Specimen: BLOOD  Result Value Ref Range Status   Specimen Description BLOOD BLOOD LEFT ARM  Final   Special Requests   Final    BOTTLES DRAWN AEROBIC AND ANAEROBIC Blood Culture results may not be optimal due to an excessive volume of blood received in culture bottles   Culture   Final    NO GROWTH 5 DAYS Performed at [facility], [address] [place], [place] [address]    Report Status 12/06/2022 FINAL  Final  Remove and replace urinary cath (placed > 5 days) then obtain urine culture from new indwelling urinary catheter.     Status: Abnormal   Collection Time: 12/01/22  7:33 PM   Specimen: Urine, Catheterized  Result Value Ref Range Status   Specimen Description   Final    URINE, CATHETERIZED Performed at [facility], [address] [place], [place] [address]    Special Requests   Final    NONE Performed at [facility], [address]  [place], [place] [address]    Culture >=100,000 COLONIES/mL ESCHERICHIA COLI (A)  Final   Report Status 12/04/2022 FINAL  Final   Organism ID, Bacteria ESCHERICHIA COLI (A)  Final      Susceptibility   Escherichia coli - MIC*    AMPICILLIN >=32 RESISTANT Resistant     CEFAZOLIN <=4 SENSITIVE Sensitive     CEFEPIME <=0.12 SENSITIVE Sensitive     CEFTRIAXONE <=0.25 SENSITIVE Sensitive     CIPROFLOXACIN <=0.25 SENSITIVE Sensitive     GENTAMICIN <=1 SENSITIVE Sensitive     IMIPENEM <=0.25 SENSITIVE Sensitive     NITROFURANTOIN <=16 SENSITIVE Sensitive     TRIMETH/SULFA <=20 SENSITIVE Sensitive     AMPICILLIN/SULBACTAM 16 INTERMEDIATE Intermediate     PIP/TAZO <=4 SENSITIVE Sensitive     * >=100,000 COLONIES/mL ESCHERICHIA COLI  MRSA Next Gen by PCR, Nasal     Status: None   Collection Time: 12/02/22  8:42 AM   Specimen: Nasal Mucosa; Nasal Swab  Result Value Ref Range Status   MRSA by PCR Next Gen NOT DETECTED NOT DETECTED Final    Comment: (NOTE) The GeneXpert MRSA Assay (FDA approved for NASAL specimens only), is one component of a comprehensive MRSA colonization surveillance program. It is not intended to diagnose MRSA infection nor to guide or monitor treatment for MRSA infections. Test performance is not FDA approved in patients less than 29 years old. Performed at [facility], [address] [place], [place] [address]   C Difficile Quick Screen w PCR reflex     Status: None   Collection Time: 12/03/22 12:52 PM   Specimen: STOOL  Result Value Ref Range Status   C Diff antigen NEGATIVE NEGATIVE Final   C Diff toxin NEGATIVE NEGATIVE Final   C Diff interpretation No C. difficile detected.  Final    Comment: Performed at [facility], [address] [place], [place] [address]    Coagulation Studies: No results for input(s): "LABPROT", "INR" in the last 72 hours.   Urinalysis: No results for input(s): "COLORURINE", "LABSPEC", "PHURINE", "GLUCOSEU",  "HGBUR", "BILIRUBINUR", "KETONESUR", "PROTEINUR", "UROBILINOGEN", "NITRITE", "LEUKOCYTESUR" in the last 72 hours.  Invalid input(s): "APPERANCEUR"     Imaging: No results found.   Medications:    cefTRIAXone (ROCEPHIN)  IV 2 g (12/05/22 2137)    apixaban  10 mg Oral BID   Followed by   Melene Muller ON 12/12/2022] apixaban  5 mg Oral BID   atorvastatin  40 mg Oral Daily  Chlorhexidine Gluconate Cloth  6 each Topical Daily   cyanocobalamin  1,000 mcg Oral Daily   DULoxetine  30 mg Oral Daily   fenofibrate  160 mg Oral Daily   gabapentin  800 mg Oral TID   insulin aspart  0-5 Units Subcutaneous QHS   insulin aspart  0-9 Units Subcutaneous TID WC   insulin glargine-yfgn  15 Units Subcutaneous Daily   nortriptyline  50 mg Oral QHS   pregabalin  75 mg Oral BID   tamsulosin  0.4 mg Oral Daily   acetaminophen **OR** acetaminophen, morphine injection, ondansetron **OR** ondansetron (ZOFRAN) IV, mouth rinse, oxyCODONE, phenol  Assessment/ Plan:  [name] is a 72 y.o.  male with hypertension, diabetes mellitus type II, diabetic peripheral neuropathy, obstructive sleep apnea, hyperlipidemia, history of prostate cancer, history of spinal surgery, history of bladder neck stricture, who was admitted to [facility] on 12/01/2022 for Complicated UTI (urinary tract infection) [N39.0] Catheter-associated urinary tract infection (HCC) [T83.511A, N39.0] Sepsis (HCC) [A41.9] Altered mental status, unspecified altered mental status type [R41.82] Other acute pulmonary embolism without acute cor pulmonale (HCC) [I26.99]   1.  Acute kidney injury secondary to sepsis and contrast exposure.  Creatinine back to baseline. Creatinine 1, GFR >60 on 11/17/22. Holding lisinopril, hydrochlorothiazide and metformin.   Renal function has returned to baseline   2.  Hyponatremia.  Was on HCTZ previously.  Do not recommend restarting at this time.    3.  Hematuria, proteinuria and E. Coli urinary tract infection.   On antibiotics: ceftriaxone.   Due to renal recovery, we will sign off at this time. Feel free to contact us with any questions or concerns.    LOS: 5   6/3/20242:13 PM

## 2022-12-06 NOTE — TOC Initial Note (Signed)
Transition of Care [facility]) - Initial/Assessment Note    Patient Details  Name: Alexander Wilcox MRN: [id number] Date of Birth: 1951-04-07  Transition of Care [facility]) CM/SW Contact:    Chapman Fitch, RN Phone Number: 12/06/2022, 11:50 AM  Clinical Narrative:                  Notified by CIR that patient does not meet medically necessity for CIR Met with patient and wife Arline Asp at bedside.  They wish to return to [employer] [facility] [place] notified Fl2 sent for [employer] with TOC to start insurance auth        Patient Goals and CMS Choice            Expected Discharge Plan and Services                                              Prior Living Arrangements/Services                       Activities of Daily Living Home Assistive Devices/Equipment: Wheelchair ADL Screening (condition at time of admission) Patient's cognitive ability adequate to safely complete daily activities?: Yes Is the patient deaf or have difficulty hearing?: No Does the patient have difficulty seeing, even when wearing glasses/contacts?: No Does the patient have difficulty concentrating, remembering, or making decisions?: Yes Patient able to express need for assistance with ADLs?: Yes Does the patient have difficulty dressing or bathing?: Yes Independently performs ADLs?: No Communication: Independent Dressing (OT): Needs assistance Is this a change from baseline?: Pre-admission baseline Grooming: Independent Feeding: Independent Bathing: Needs assistance Is this a change from baseline?: Pre-admission baseline Toileting: Needs assistance Is this a change from baseline?: Pre-admission baseline In/Out Bed: Needs assistance Is this a change from baseline?: Pre-admission baseline Walks in Home: Needs assistance Is this a change from baseline?: Pre-admission baseline Does the patient have difficulty walking or climbing stairs?: Yes Weakness of Legs:  Both Weakness of Arms/Hands: None  Permission Sought/Granted                  Emotional Assessment              Admission diagnosis:  Complicated UTI (urinary tract infection) [N39.0] Catheter-associated urinary tract infection (HCC) [T83.511A, N39.0] Sepsis (HCC) [A41.9] Altered mental status, unspecified altered mental status type [R41.82] Other acute pulmonary embolism without acute cor pulmonale (HCC) [I26.99] Patient Active Problem List   Diagnosis Date Noted   Sepsis (HCC) 12/02/2022   Complicated UTI (urinary tract infection) 12/01/2022   S/P fusion of thoracic spine 11/12/22 for traumatic T9 fracture 12/01/2022   Catheter-associated urinary tract infection (HCC) 12/01/2022   Pulmonary embolus (HCC) 12/01/2022   S/P dilation of urethra 11/12/22 with indwelling Foley 12/01/2022   Acute metabolic encephalopathy 12/01/2022   Generalized weakness 12/01/2022   Frailty 12/01/2022   Carotid artery stenosis 12/01/2022   Constipation 11/15/2022   Hematuria 11/15/2022   Bladder neck contracture s/p dilatation 11/12/22 11/14/2022   AKI (acute kidney injury) (HCC) 11/14/2022   Hyponatremia 11/13/2022   Closed T9 spinal fracture (HCC) 11/12/2022   Prostate cancer (HCC) 11/12/2022   Chronic back pain 11/12/2022   Radial head fracture, closed 11/12/2022   Transaminitis 11/12/2022   Leukocytosis 11/12/2022   Thoracic spine instability 11/12/2022   Closed tricolumnar fracture of thoracic vertebra (HCC)  11/12/2022   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 11/12/2022   Fx dorsal vertebra-closed (HCC) 11/12/2022   Facial droop 11/12/2022   Diabetic peripheral neuropathy (HCC) 01/21/2021   Elevated hemoglobin A1c 01/21/2021   Uncontrolled type 2 diabetes mellitus with hyperglycemia, with long-term current use of insulin (HCC) 01/21/2021   Cervical cord myelomalacia (HCC) 01/21/2021   Chronic pain syndrome 11/10/2020   Pharmacologic therapy 11/10/2020    Disorder of skeletal system 11/10/2020   Problems influencing health status 11/10/2020   Abnormal MRI, cervical spine (12/10/2020) 11/10/2020   Abnormal CT scan, cervical spine (06/17/2014) 11/10/2020   Abnormal MRI, lumbar spine (02/05/2013) 11/10/2020   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 11/10/2020   History of lumbar surgery 11/10/2020   Cervical spondylosis with myelopathy and radiculopathy 11/10/2020   Cervical radiculitis (intermittent) 11/10/2020   Chronic upper extremity pain (Bilateral) (intermittent) 11/10/2020   Chronic lower extremity pain (Bilateral) (intermittent) 11/10/2020   Gastroesophageal reflux disease without esophagitis 06/24/2020   B12 deficiency 01/10/2020   Disease related peripheral neuropathy 01/10/2020   History of rheumatoid arthritis 12/19/2019   Medicare annual wellness visit, initial 09/18/2019   Mild concentric left ventricular hypertrophy (LVH) 02/13/2019   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  12/07/2016   History of hypertension 12/07/2016   History of prostate cancer 12/07/2016   Plantar fasciitis 12/07/2016   Elevated LFTs 12/07/2016   DDD (degenerative disc disease), lumbar s/p fusion  12/07/2016   DDD (degenerative disc disease), thoracic 12/07/2016   DDD (degenerative disc disease), cervical s/p fusion  12/07/2016   Osteoarthritis of hands (Bilateral) 12/07/2016   Osteoarthritis of knees (Bilateral) 12/07/2016   Osteoarthritis of feet (Bilateral) 12/07/2016   Rheumatoid factor positive 12/07/2016   History of sleep apnea 12/07/2016   Hyperuricemia 12/07/2016   Chronic right SI joint pain 12/07/2016   DISH (diffuse idiopathic skeletal hyperostosis) 08/05/2016   History of fusion of cervical spine 08/05/2016   Mixed hyperlipidemia 07/01/2016   Type 2 diabetes mellitus with hyperlipidemia (HCC) 06/23/2015   Essential hypertension 05/22/2014   Obesity, Class III, BMI 40-49.9 (morbid obesity) (HCC)  05/22/2014   OSA on CPAP 05/22/2014   PCP:  Marisue Ivan, MD Pharmacy:   [facility] [address], [place] - [address] [place] [address] Phone: [phone] Fax: [phone]     Social Determinants of Health (SDOH) Social History: SDOH Screenings   Food Insecurity: No Food Insecurity (11/12/2022)  Housing: Patient Unable To Answer (11/12/2022)  Transportation Needs: No Transportation Needs (11/12/2022)  Utilities: Not At Risk (11/12/2022)  Tobacco Use: Medium Risk (12/02/2022)   SDOH Interventions:     Readmission Risk Interventions     No data to display

## 2022-12-06 NOTE — Progress Notes (Signed)
Occupational Therapy Treatment Patient Details Name: Alexander Wilcox MRN: [id number] DOB: Oct 01, 1950 Today's Date: 12/06/2022   History of present illness Alexander Wilcox is a 72 y/o M admitted on 12/01/22 after presenting from rehab with c/o AMS. Pt was dx with UTI 3 days prior. Pt is being treated for catheter associated UTI & possible sepsis. Pt also noted to have multiple PE in RLL, LLE DVT. Pt with recent hospitalizations 5/9-5/16 & underwent T7-12 fusion & bladder neck dilation for stricture with placement of foley; stay was complicated by acute neurological deficit with findings of stenosis of vertebral, internal carotid & basilar arteries. PMH: HTN, IDDM2 with neuropathy, OSA on CPAP, HLD, prostate CA, failed back surgical syndrome with prior cervical & lumbar fusions, sleep apnea   OT comments  Alexander Wilcox was seen for OT treatment on this date. Upon arrival to room pt reclined in bed, agreeable to tx. Pt requires SETUP + SUPERVISION seated grooming tasks. Reviewed bed level HEP and demonstrated exercises. MIN A + RW with L knee block for initial sit<>stand, improves to CGA for x3 sit<>stands and side steps along EOB ~3 steps. Pt making good progress toward goals, will continue to follow POC. Discharge recommendation remains appropriate.     Recommendations for follow up therapy are one component of a multi-disciplinary discharge planning process, led by the attending physician.  Recommendations may be updated based on patient status, additional functional criteria and insurance authorization.    Assistance Recommended at Discharge Frequent or constant Supervision/Assistance  Patient can return home with the following  Two people to help with walking and/or transfers;Assistance with cooking/housework;Assist for transportation;Help with stairs or ramp for entrance;A lot of help with bathing/dressing/bathroom;Direct supervision/assist for medications management   Equipment Recommendations   Other (comment) (defer)    Recommendations for Other Services      Precautions / Restrictions Precautions Precautions: Back;Fall Restrictions Weight Bearing Restrictions: No       Mobility Bed Mobility Overal bed mobility: Needs Assistance Bed Mobility: Supine to Sit, Sit to Supine     Supine to sit: Mod assist Sit to supine: Min assist        Transfers Overall transfer level: Needs assistance Equipment used: Rolling walker (2 wheels) Transfers: Sit to/from Stand Sit to Stand: Min assist, +2 safety/equipment, From elevated surface           General transfer comment: side steps along EOB, R knee block     Balance Overall balance assessment: Needs assistance Sitting-balance support: No upper extremity supported, Feet supported Sitting balance-Leahy Scale: Good     Standing balance support: Bilateral upper extremity supported Standing balance-Leahy Scale: Poor                             ADL either performed or assessed with clinical judgement   ADL Overall ADL's : Needs assistance/impaired                                       General ADL Comments: SETUP + SUPERVISION seated grooming tasks. MAX A don B socks seated EOB.      Cognition Arousal/Alertness: Awake/alert Behavior During Therapy: WFL for tasks assessed/performed Overall Cognitive Status: Within Functional Limits for tasks assessed  Pertinent Vitals/ Pain       Pain Assessment Pain Assessment: 0-10 Pain Score: 5  Pain Location: back Pain Descriptors / Indicators: Aching, Grimacing Pain Intervention(s): Limited activity within patient's tolerance, Repositioned   Frequency  Min 3X/week        Progress Toward Goals  OT Goals(current goals can now be found in the care plan section)  Progress towards OT goals: Progressing toward goals  Acute Rehab OT Goals Patient Stated Goal: to go  to inpatient rehab OT Goal Formulation: With patient Time For Goal Achievement: 12/18/22 Potential to Achieve Goals: Good ADL Goals Pt Will Perform Grooming: sitting;with modified independence Pt Will Perform Lower Body Dressing: sitting/lateral leans;with min assist;sit to/from stand;with adaptive equipment;with caregiver independent in assisting Pt Will Transfer to Toilet: bedside commode;with min assist;stand pivot transfer Pt Will Perform Toileting - Clothing Manipulation and hygiene: sit to/from stand;sitting/lateral leans;with min assist;with caregiver independent in assisting;with adaptive equipment  Plan Discharge plan remains appropriate;Frequency remains appropriate    Co-evaluation                 AM-PAC OT "6 Clicks" Daily Activity     Outcome Measure   Help from another person eating meals?: None Help from another person taking care of personal grooming?: A Little Help from another person toileting, which includes using toliet, bedpan, or urinal?: A Lot Help from another person bathing (including washing, rinsing, drying)?: A Lot Help from another person to put on and taking off regular upper body clothing?: A Little Help from another person to put on and taking off regular lower body clothing?: A Lot 6 Click Score: 16    End of Session Equipment Utilized During Treatment: Rolling walker (2 wheels);Gait belt  OT Visit Diagnosis: Muscle weakness (generalized) (M62.81);Other abnormalities of gait and mobility (R26.89)   Activity Tolerance Patient tolerated treatment well   Patient Left in bed;with call bell/phone within reach;with nursing/sitter in room;with family/visitor present   Nurse Communication          Time: 1308-6578 OT Time Calculation (min): 31 min  Charges: OT General Charges $OT Visit: 1 Visit OT Treatments $Self Care/Home Management : 8-22 mins $Therapeutic Exercise: 8-22 mins  Kathie Dike, M.S. OTR/L  12/06/22, 1:08 PM  ascom  [phone]

## 2022-12-06 NOTE — Progress Notes (Signed)
Date of Admission:  12/01/2022      ID: FUMIO BERARD is a 72 y.o. 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   AKI (acute kidney injury) (HCC)   Complicated UTI (urinary tract infection)   S/P fusion of thoracic spine 11/12/22 for traumatic T9 fracture   Pulmonary embolus (HCC)   S/P dilation of urethra 11/12/22 with indwelling Foley   Acute metabolic encephalopathy   Frailty   Carotid artery stenosis   Sepsis (HCC)    Subjective: Doing much better No abdominal pain  Medications:   apixaban  10 mg Oral BID   Followed by   Melene Muller ON 12/12/2022] apixaban  5 mg Oral BID   atorvastatin  40 mg Oral Daily   Chlorhexidine Gluconate Cloth  6 each Topical Daily   cyanocobalamin  1,000 mcg Oral Daily   DULoxetine  30 mg Oral Daily   fenofibrate  160 mg Oral Daily   gabapentin  800 mg Oral TID   insulin aspart  0-5 Units Subcutaneous QHS   insulin aspart  0-9 Units Subcutaneous TID WC   insulin glargine-yfgn  15 Units Subcutaneous Daily   nortriptyline  50 mg Oral QHS   pregabalin  75 mg Oral BID   tamsulosin  0.4 mg Oral Daily    Objective: Vital signs in last 24 hours: Patient Vitals for the past 24 hrs:  BP Temp Temp src Pulse Resp SpO2  12/06/22 1619 (!) 119/46 97.9 F (36.6 C) Oral 79 18 96 %  12/06/22 0719 (!) 147/67 98.3 F (36.8 C) -- 71 16 98 %  12/06/22 0500 138/67 98 F (36.7 C) Oral 72 18 100 %  12/05/22 1924 126/61 98 F (36.7 C) -- 82 18 100 %  12/05/22 1708 (!) 123/58 98 F (36.7 C) -- 80 18 98 %     LDA Foley   PHYSICAL EXAM:  General: Alert, cooperative, no distress,  Lungs: Clear  to auscultation bilaterally. No Wheezing or Rhonchi. No rales. Heart: irregular. Abdomen: Soft, non-tender,not distended. Bowel sounds normal. No masses foley Extremities: atraumatic, no cyanosis. No edema. No clubbing Skin: No rashes or lesions. Or bruising Lymph: Cervical, supraclavicular normal. Neurologic: Grossly non-focal  Lab Results    Latest Ref Rng & Units 12/06/2022    4:33 AM 12/05/2022    1:54 AM 12/04/2022    8:39 AM  CBC  WBC 4.0 - 10.5 K/uL 2.7  3.7  5.7   Hemoglobin 13.0 - 17.0 g/dL 9.1  7.9  8.7   Hematocrit 39.0 - 52.0 % 27.3  23.4  26.0   Platelets 150 - 400 K/uL 202  204  227        Latest Ref Rng & Units 12/04/2022    8:40 AM 12/04/2022    8:39 AM 12/03/2022    2:10 AM  CMP  Glucose 70 - 99 mg/dL 191  478  295   BUN 8 - 23 mg/dL 21  21  38   Creatinine 0.61 - 1.24 mg/dL 6.21  3.08  6.57   Sodium 135 - 145 mmol/L 132  133  130   Potassium 3.5 -  5.1 mmol/L 3.6  3.6  3.9   Chloride 98 - 111 mmol/L 101  102  94   CO2 22 - 32 mmol/L 24  25  22    Calcium 8.9 - 10.3 mg/dL 8.5  8.6  8.4       Microbiology: UC- ecoli Studies/Results: No results found.   Assessment/Plan: Acute encephalopathy resolved Combination of infection/meds/OSA  Acute PE with left leg DVT on anticoagulation  Complicated UTI due to E.coli Bladder neck stricture needing dilatation and foley placement Foley was obstructed by blood clots in the SN[facility] Had hematuria and blood clots in the bladder- urology has been consulted On ceftriaxone Antibiotic until 12/14/22 ( 2 weeks) Can be switched to cipro or levo on discharge He need to follow up with urology   Recent T9 fracture s/p arthrodesis on 5/10- surgical site healed well  Discussed the management with the patient and his wife and daughter in great detail  ID will sign off- call if needed

## 2022-12-06 NOTE — TOC Benefit Eligibility Note (Signed)
Patient Advocate Encounter  Insurance verification completed.    The patient is currently admitted and upon discharge could be taking Eliquis 5 mg.  The current 30 day co-pay is $139.76 due to being in Coverage Gap (donut hole).   The patient is insured through [employer] Part D   This test claim was processed through [facility]- copay amounts may vary at other pharmacies due to pharmacy/plan contracts, or as the patient moves through the different stages of their insurance plan.  Marigrace Mccole, CPHT Pharmacy Patient Advocate Specialist [facility] Patient Advocate Team Direct Number: [phone]  Fax: [phone]       

## 2022-12-06 NOTE — Care Management Important Message (Signed)
Important Message  Patient Details  Name: Alexander Wilcox MRN: [id number] Date of Birth: June 03, 1951   Medicare Important Message Given:  Yes     Johnell Comings 12/06/2022, 10:34 AM

## 2022-12-06 NOTE — [place] FL2 (Signed)
[place] MEDICAID FL2 LEVEL OF CARE FORM     IDENTIFICATION  Patient Name: Alexander Wilcox Birthdate: 09/19/1950 Sex: male Admission Date (Current Location): 12/01/2022  [facility] and [place] Number:  Chiropodist and Address:  [facility], [address] [place], [place] [address]      Provider Number: [id number]  Attending Physician Name and Address:  Enedina Finner, MD  Relative Name and Phone Number:       Current Level of Care: Hospital Recommended Level of Care: Skilled Nursing Facility Prior Approval Number:    Date Approved/Denied:   PASRR Number: [id number]  Discharge Plan: SNF    Current Diagnoses: Patient Active Problem List   Diagnosis Date Noted   Sepsis (HCC) 12/02/2022   Complicated UTI (urinary tract infection) 12/01/2022   S/P fusion of thoracic spine 11/12/22 for traumatic T9 fracture 12/01/2022   Catheter-associated urinary tract infection (HCC) 12/01/2022   Pulmonary embolus (HCC) 12/01/2022   S/P dilation of urethra 11/12/22 with indwelling Foley 12/01/2022   Acute metabolic encephalopathy 12/01/2022   Generalized weakness 12/01/2022   Frailty 12/01/2022   Carotid artery stenosis 12/01/2022   Constipation 11/15/2022   Hematuria 11/15/2022   Bladder neck contracture s/p dilatation 11/12/22 11/14/2022   AKI (acute kidney injury) (HCC) 11/14/2022   Hyponatremia 11/13/2022   Closed T9 spinal fracture (HCC) 11/12/2022   Prostate cancer (HCC) 11/12/2022   Chronic back pain 11/12/2022   Radial head fracture, closed 11/12/2022   Transaminitis 11/12/2022   Leukocytosis 11/12/2022   Thoracic spine instability 11/12/2022   Closed tricolumnar fracture of thoracic vertebra (HCC) 11/12/2022   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 11/12/2022   Fx dorsal vertebra-closed (HCC) 11/12/2022   Facial droop 11/12/2022   Diabetic peripheral neuropathy (HCC) 01/21/2021   Elevated hemoglobin A1c 01/21/2021    Uncontrolled type 2 diabetes mellitus with hyperglycemia, with long-term current use of insulin (HCC) 01/21/2021   Cervical cord myelomalacia (HCC) 01/21/2021   Chronic pain syndrome 11/10/2020   Pharmacologic therapy 11/10/2020   Disorder of skeletal system 11/10/2020   Problems influencing health status 11/10/2020   Abnormal MRI, cervical spine (12/10/2020) 11/10/2020   Abnormal CT scan, cervical spine (06/17/2014) 11/10/2020   Abnormal MRI, lumbar spine (02/05/2013) 11/10/2020   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 11/10/2020   History of lumbar surgery 11/10/2020   Cervical spondylosis with myelopathy and radiculopathy 11/10/2020   Cervical radiculitis (intermittent) 11/10/2020   Chronic upper extremity pain (Bilateral) (intermittent) 11/10/2020   Chronic lower extremity pain (Bilateral) (intermittent) 11/10/2020   Gastroesophageal reflux disease without esophagitis 06/24/2020   B12 deficiency 01/10/2020   Disease related peripheral neuropathy 01/10/2020   History of rheumatoid arthritis 12/19/2019   Medicare annual wellness visit, initial 09/18/2019   Mild concentric left ventricular hypertrophy (LVH) 02/13/2019   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  12/07/2016   History of hypertension 12/07/2016   History of prostate cancer 12/07/2016   Plantar fasciitis 12/07/2016   Elevated LFTs 12/07/2016   DDD (degenerative disc disease), lumbar s/p fusion  12/07/2016   DDD (degenerative disc disease), thoracic 12/07/2016   DDD (degenerative disc disease), cervical s/p fusion  12/07/2016   Osteoarthritis of hands (Bilateral) 12/07/2016   Osteoarthritis of knees (Bilateral) 12/07/2016   Osteoarthritis of feet (Bilateral) 12/07/2016   Rheumatoid factor positive 12/07/2016   History of sleep apnea 12/07/2016   Hyperuricemia 12/07/2016   Chronic right SI joint pain 12/07/2016   DISH (diffuse idiopathic  skeletal hyperostosis) 08/05/2016   History  of fusion of cervical spine 08/05/2016   Mixed hyperlipidemia 07/01/2016   Type 2 diabetes mellitus with hyperlipidemia (HCC) 06/23/2015   Essential hypertension 05/22/2014   Obesity, Class III, BMI 40-49.9 (morbid obesity) (HCC) 05/22/2014   OSA on CPAP 05/22/2014    Orientation RESPIRATION BLADDER Height & Weight     Self, Time, Situation, Place  Normal Indwelling catheter Weight: 113.5 kg Height:  5\' 8"  (172.7 cm)  BEHAVIORAL SYMPTOMS/MOOD NEUROLOGICAL BOWEL NUTRITION STATUS      Incontinent Diet (Heart Healthy Carb modified)  AMBULATORY STATUS COMMUNICATION OF NEEDS Skin   Extensive Assist Verbally Skin abrasions, PU Stage and Appropriate Care, Other (Comment) (Scab on back)                       Personal Care Assistance Level of Assistance              Functional Limitations Info             SPECIAL CARE FACTORS FREQUENCY  PT (By licensed PT), OT (By licensed OT)                    Contractures Contractures Info: Not present    Additional Factors Info  Code Status, Allergies Code Status Info: Full Allergies Info: dilaudid (hydromorphone hcl)           Current Medications (12/06/2022):  This is the current hospital active medication list Current Facility-Administered Medications  Medication Dose Route Frequency Provider Last Rate Last Admin   acetaminophen (TYLENOL) tablet 650 mg  650 mg Oral Q6H PRN Andris Baumann, MD   650 mg at 12/04/22 1529   Or   acetaminophen (TYLENOL) suppository 650 mg  650 mg Rectal Q6H PRN Andris Baumann, MD       apixaban Everlene Balls) tablet 10 mg  10 mg Oral BID Enedina Finner, MD   10 mg at 12/06/22 1610   Followed by   Melene Muller ON 12/12/2022] apixaban (ELIQUIS) tablet 5 mg  5 mg Oral BID Enedina Finner, MD       atorvastatin (LIPITOR) tablet 40 mg  40 mg Oral Daily Enedina Finner, MD   40 mg at 12/06/22 0831   cefTRIAXone (ROCEPHIN) 2 g in sodium chloride 0.9 % 100 mL IVPB  2 g Intravenous Q24H Lynn Ito, MD 200  mL/hr at 12/05/22 2137 2 g at 12/05/22 2137   Chlorhexidine Gluconate Cloth 2 % PADS 6 each  6 each Topical Daily Enedina Finner, MD   6 each at 12/06/22 9604   cyanocobalamin (VITAMIN B12) tablet 1,000 mcg  1,000 mcg Oral Daily Enedina Finner, MD   1,000 mcg at 12/06/22 0831   DULoxetine (CYMBALTA) DR capsule 30 mg  30 mg Oral Daily Enedina Finner, MD   30 mg at 12/06/22 0831   fenofibrate tablet 160 mg  160 mg Oral Daily Enedina Finner, MD   160 mg at 12/06/22 0831   gabapentin (NEURONTIN) capsule 800 mg  800 mg Oral TID Enedina Finner, MD   800 mg at 12/06/22 0831   insulin aspart (novoLOG) injection 0-5 Units  0-5 Units Subcutaneous QHS Enedina Finner, MD   2 Units at 12/02/22 2129   insulin aspart (novoLOG) injection 0-9 Units  0-9 Units Subcutaneous TID WC Enedina Finner, MD   1 Units at 12/06/22 0831   insulin glargine-yfgn (SEMGLEE) injection 15 Units  15 Units Subcutaneous Daily Enedina Finner, MD   15  Units at 12/06/22 0843   morphine (PF) 2 MG/ML injection 1 mg  1 mg Intravenous Q6H PRN Enedina Finner, MD   1 mg at 12/04/22 2208   nortriptyline (PAMELOR) capsule 50 mg  50 mg Oral QHS Enedina Finner, MD       ondansetron [facility]) tablet 4 mg  4 mg Oral Q6H PRN Andris Baumann, MD       Or   ondansetron [facility]) injection 4 mg  4 mg Intravenous Q6H PRN Andris Baumann, MD       Oral care mouth rinse  15 mL Mouth Rinse PRN Enedina Finner, MD       oxyCODONE (Oxy IR/ROXICODONE) immediate release tablet 7.5 mg  7.5 mg Oral Q4H PRN Enedina Finner, MD   7.5 mg at 12/05/22 2124   phenol (CHLORASEPTIC) mouth spray 1 spray  1 spray Mouth/Throat PRN Enedina Finner, MD       pregabalin (LYRICA) capsule 75 mg  75 mg Oral BID Enedina Finner, MD   75 mg at 12/06/22 0831   tamsulosin (FLOMAX) capsule 0.4 mg  0.4 mg Oral Daily Enedina Finner, MD   0.4 mg at 12/06/22 6045     Discharge Medications: Please see discharge summary for a list of discharge medications.  Relevant Imaging Results:  Relevant Lab Results:   Additional  Information SS #: [id number]  Chapman Fitch, RN

## 2022-12-06 NOTE — Progress Notes (Signed)
Inpatient Rehab Admissions Coordinator:    I spoke with Pt. And wife over the phone to discuss possible CIR admit. Unfortunately, pt. Does not demonstrate sufficient medical necessity that I believe I could obtain insurance auth with his payor. Pt. And wife are open to SNF, but not sure if they want to return to [facility]. CIR will sign off. I have notified MD and TOC.   Megan Salon, MS, CCC-SLP Rehab Admissions Coordinator  [phone] (celll) [phone] (office)

## 2022-12-06 NOTE — Progress Notes (Signed)
[facility]  - [place] [facility]   PATIENT NAME: Alexander Wilcox    MR#:  [id number]  DATE OF BIRTH:  1950-11-20  SUBJECTIVE:  No family at bedside.  Overall doing much better. Good urine output. Urine appears clear today No fever. Tolerating PO diet VITALS:  Blood pressure (!) 147/67, pulse 71, temperature 98.3 F (36.8 C), resp. rate 16, height 5\' 8"  (1.727 m), weight 113.5 kg, SpO2 98 %.  PHYSICAL EXAMINATION:   GENERAL:  72 y.o.-year-old patient with no acute distress. Obese LUNGS: Normal breath sounds bilaterally CARDIOVASCULAR: S1, S2 normal. No murmur  ABDOMEN: Soft, nontender, nondistended. Bowel sounds present. Chronic Foley catheter with dark urine EXTREMITIES: No  edema b/l.    NEUROLOGIC left LE weakness since surgery 11/12/22 patient is oriented to time place person   LABORATORY PANEL:  CBC Recent Labs  Lab 12/06/22 0433  WBC 2.7*  HGB 9.1*  HCT 27.3*  PLT 202     Chemistries  Recent Labs  Lab 12/01/22 1933 12/02/22 0513 12/04/22 0840  NA  --    < > 132*  K  --    < > 3.6  CL  --    < > 101  CO2  --    < > 24  GLUCOSE  --    < > 154*  BUN  --    < > 21  CREATININE  --    < > 1.11  CALCIUM  --    < > 8.5*  AST 17  --   --   ALT 26  --   --   ALKPHOS 114  --   --   BILITOT 1.3*  --   --    < > = values in this interval not displayed.   Assessment and Plan  Alexander Wilcox is a 72 y.o. 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 Patient went to rehab with Foley catheter. He was seen on follow-up with urology on 21st May and decision was to continue Foley  catheter for now since patient was getting rehab and be removed on December 04, 2022.  Patient came in with increasing confusion, hematuria, tachycardic and was admitted with sepsis suspected due to UTI in the setting of Foley catheter. Patient was also noted to have multiple pulmonary emboli on CT scan. Currently on IV heparin drip.  CT head nonacute CT abdomen and pelvis showing cystitis and multilevel severe degenerative changes of the spine as well as evidence of pulmonary emboli CTA chest confirms multiple segmental and subsegmental filling defects in the right lower lobe compatible with multiple pulmonary emboli without evidence of right heart strain.  Catheter-associated urinary tract infection (HCC) History of bladder neck stricture requiring dilatation 11/12/2022 with indwelling Foley placed in the OR on 5/10 Prostate cancer on leuprolide Severe Sepsis --Patient presents with weakness, altered mental status in the setting of UTI on outpatient antibiotics.  Has tachycardia, leukocytosis with normal lactic acid.  AKI, altered mental status/metabolic encephalopathy --Sepsis fluids --Blood culture neg --UC >100K E coli --Foley was exchanged in the ED on 12/01/2022 -- infectious disease consultation with Dr. Quincy Simmonds Rocephin -- sepsis  improving   Multiple pulmonary emboli (HCC) Left Posterior tibial vein DVT --pt was on IV heparin infusion--now on po eliquis --Provoked PE from recent surgery, immobilization --Supplemental oxygen if needed --[place] LE noted to have left post tibial vein DVT   Acute metabolic encephalopathy/Delirium --Secondary to UTI and PE --CT head nonacute --Aspiration precautions   AKI (acute kidney injury) (HCC)/Hyponatremia --Creatinine 1.38--1.79--2.10--1.1 --baseline of 1.0 --Suspecting related to dehydration from poor oral intake --Will hold lisinopril, HCTZ, metformin.  Avoid nephrotoxins -- nephrology consultation with Dr. 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 Marcell Barlow in the ED --Continue multimodal pain control with gabapentin, duloxetine and narcotics as needed once mentation better  Acute blood loss anemia due to hematuria with UTI --baseline hgb 15.9--7.9 --will give BT as needed. Pt agreeable  Hyponatremia Suspect dehydration from poor oral intake --As above --improved sodium   Uncontrolled type 2 diabetes mellitus with hyperglycemia, with long-term current use of insulin (HCC) --Blood sugar 263 ---Continue basal insulin --Sliding scale insulin coverage   Carotid artery stenosis Possible prior TIA 11/12/2022 -CTA 11/12/22 showed Occluded right vertebral artery ...,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 -Hold aspirin while on systemic anticoagulation - Continue atorvastatin and fenofibrate   Frailty/Debility --Patient has recent prolonged hospitalization, recent back surgery with chronic comorbidities and current acute illnesses --Currently at SNF s/p recent hospitalization from 5/10 to 5/16 --PT recommendations appreciated. TOC for discharge planning. Given recent back surgery with neuro deficit left lower extremity and complication with hematuria and PE patient will benefit from inpatient rehab vs SNF.    OSA on CPAP --Continue CPAP   Essential hypertension --Continue metoprolol --As needed hydralazine --Will hold lisinopril HCTZ given hyponatremia and AKI     DVT prophylaxis: Heparin Code status: full Family communication :none today Consults : ID, neurosurgery, nephrology  Level of care: Med-Surg Status is: Inpatient Remains inpatient appropriate because: pt is medically improved and stable for discharge. TOC for discharge planning.      TOTAL critical TIME TAKING CARE OF THIS PATIENT: 35 minutes.  >50% time spent on counselling and coordination of care  Note: This dictation was prepared with Dragon dictation along with smaller phrase technology. Any transcriptional errors that result from this process are unintentional.  Enedina Finner M.D    [facility]   CC: Primary care physician; Marisue Ivan, MD

## 2022-12-07 DIAGNOSIS — E1165 Type 2 diabetes mellitus with hyperglycemia: Secondary | ICD-10-CM

## 2022-12-07 DIAGNOSIS — S22079S Unspecified fracture of T9-T10 vertebra, sequela: Secondary | ICD-10-CM | POA: Diagnosis not present

## 2022-12-07 DIAGNOSIS — R1909 Other intra-abdominal and pelvic swelling, mass and lump: Secondary | ICD-10-CM | POA: Diagnosis not present

## 2022-12-07 DIAGNOSIS — L304 Erythema intertrigo: Secondary | ICD-10-CM | POA: Diagnosis not present

## 2022-12-07 DIAGNOSIS — Z8546 Personal history of malignant neoplasm of prostate: Secondary | ICD-10-CM | POA: Diagnosis not present

## 2022-12-07 DIAGNOSIS — M1712 Unilateral primary osteoarthritis, left knee: Secondary | ICD-10-CM | POA: Diagnosis not present

## 2022-12-07 DIAGNOSIS — M25552 Pain in left hip: Secondary | ICD-10-CM | POA: Diagnosis not present

## 2022-12-07 DIAGNOSIS — R54 Age-related physical debility: Secondary | ICD-10-CM | POA: Diagnosis not present

## 2022-12-07 DIAGNOSIS — Z7401 Bed confinement status: Secondary | ICD-10-CM | POA: Diagnosis not present

## 2022-12-07 DIAGNOSIS — I2609 Other pulmonary embolism with acute cor pulmonale: Secondary | ICD-10-CM | POA: Diagnosis not present

## 2022-12-07 DIAGNOSIS — W19XXXD Unspecified fall, subsequent encounter: Secondary | ICD-10-CM | POA: Diagnosis not present

## 2022-12-07 DIAGNOSIS — R6520 Severe sepsis without septic shock: Secondary | ICD-10-CM | POA: Diagnosis not present

## 2022-12-07 DIAGNOSIS — D500 Iron deficiency anemia secondary to blood loss (chronic): Secondary | ICD-10-CM | POA: Diagnosis not present

## 2022-12-07 DIAGNOSIS — N390 Urinary tract infection, site not specified: Secondary | ICD-10-CM | POA: Diagnosis not present

## 2022-12-07 DIAGNOSIS — M1612 Unilateral primary osteoarthritis, left hip: Secondary | ICD-10-CM | POA: Diagnosis not present

## 2022-12-07 DIAGNOSIS — I10 Essential (primary) hypertension: Secondary | ICD-10-CM | POA: Diagnosis not present

## 2022-12-07 DIAGNOSIS — M19172 Post-traumatic osteoarthritis, left ankle and foot: Secondary | ICD-10-CM | POA: Diagnosis not present

## 2022-12-07 DIAGNOSIS — I2699 Other pulmonary embolism without acute cor pulmonale: Secondary | ICD-10-CM | POA: Diagnosis not present

## 2022-12-07 DIAGNOSIS — E871 Hypo-osmolality and hyponatremia: Secondary | ICD-10-CM | POA: Diagnosis not present

## 2022-12-07 DIAGNOSIS — A419 Sepsis, unspecified organism: Secondary | ICD-10-CM | POA: Diagnosis not present

## 2022-12-07 DIAGNOSIS — G9341 Metabolic encephalopathy: Secondary | ICD-10-CM | POA: Diagnosis not present

## 2022-12-07 DIAGNOSIS — G4733 Obstructive sleep apnea (adult) (pediatric): Secondary | ICD-10-CM | POA: Diagnosis not present

## 2022-12-07 DIAGNOSIS — E1142 Type 2 diabetes mellitus with diabetic polyneuropathy: Secondary | ICD-10-CM | POA: Diagnosis not present

## 2022-12-07 DIAGNOSIS — F32A Depression, unspecified: Secondary | ICD-10-CM | POA: Diagnosis not present

## 2022-12-07 DIAGNOSIS — M79605 Pain in left leg: Secondary | ICD-10-CM | POA: Diagnosis not present

## 2022-12-07 DIAGNOSIS — R5383 Other fatigue: Secondary | ICD-10-CM | POA: Diagnosis not present

## 2022-12-07 DIAGNOSIS — Z981 Arthrodesis status: Secondary | ICD-10-CM | POA: Diagnosis not present

## 2022-12-07 DIAGNOSIS — M79672 Pain in left foot: Secondary | ICD-10-CM | POA: Diagnosis not present

## 2022-12-07 DIAGNOSIS — N320 Bladder-neck obstruction: Secondary | ICD-10-CM | POA: Diagnosis not present

## 2022-12-07 DIAGNOSIS — Z794 Long term (current) use of insulin: Secondary | ICD-10-CM

## 2022-12-07 DIAGNOSIS — M25572 Pain in left ankle and joints of left foot: Secondary | ICD-10-CM | POA: Diagnosis not present

## 2022-12-07 DIAGNOSIS — I82402 Acute embolism and thrombosis of unspecified deep veins of left lower extremity: Secondary | ICD-10-CM | POA: Diagnosis not present

## 2022-12-07 DIAGNOSIS — B9620 Unspecified Escherichia coli [E. coli] as the cause of diseases classified elsewhere: Secondary | ICD-10-CM | POA: Diagnosis not present

## 2022-12-07 DIAGNOSIS — S22078D Other fracture of T9-T10 vertebra, subsequent encounter for fracture with routine healing: Secondary | ICD-10-CM | POA: Diagnosis not present

## 2022-12-07 DIAGNOSIS — M25562 Pain in left knee: Secondary | ICD-10-CM | POA: Diagnosis not present

## 2022-12-07 DIAGNOSIS — I2510 Atherosclerotic heart disease of native coronary artery without angina pectoris: Secondary | ICD-10-CM | POA: Diagnosis not present

## 2022-12-07 DIAGNOSIS — R600 Localized edema: Secondary | ICD-10-CM | POA: Diagnosis not present

## 2022-12-07 DIAGNOSIS — W19XXXA Unspecified fall, initial encounter: Secondary | ICD-10-CM | POA: Diagnosis not present

## 2022-12-07 DIAGNOSIS — Z7901 Long term (current) use of anticoagulants: Secondary | ICD-10-CM | POA: Diagnosis not present

## 2022-12-07 DIAGNOSIS — R32 Unspecified urinary incontinence: Secondary | ICD-10-CM | POA: Diagnosis not present

## 2022-12-07 DIAGNOSIS — Z7984 Long term (current) use of oral hypoglycemic drugs: Secondary | ICD-10-CM | POA: Diagnosis not present

## 2022-12-07 DIAGNOSIS — D62 Acute posthemorrhagic anemia: Secondary | ICD-10-CM | POA: Diagnosis not present

## 2022-12-07 LAB — GLUCOSE, CAPILLARY: Glucose-Capillary: 156 mg/dL — ABNORMAL HIGH (ref 70–99)

## 2022-12-07 LAB — BASIC METABOLIC PANEL
Anion gap: 9 (ref 5–15)
BUN: 14 mg/dL (ref 8–23)
CO2: 27 mmol/L (ref 22–32)
Calcium: 8.9 mg/dL (ref 8.9–10.3)
Chloride: 103 mmol/L (ref 98–111)
Creatinine, Ser: 0.88 mg/dL (ref 0.61–1.24)
GFR, Estimated: >60 mL/min (ref >60)
Glucose, Bld: 167 mg/dL — ABNORMAL HIGH (ref 70–99)
Potassium: 3.7 mmol/L (ref 3.5–5.1)
Sodium: 139 mmol/L (ref 135–145)

## 2022-12-07 MED ORDER — INSULIN GLARGINE-YFGN 100 UNIT/ML ~~LOC~~ SOLN: 15.0000 [IU] | Freq: Every day | SUBCUTANEOUS | 11 refills | Status: DC

## 2022-12-07 MED ORDER — OXYCODONE HCL 7.5 MG PO TABS: 7.5000 mg | ORAL_TABLET | Freq: Four times a day (QID) | ORAL | 0 refills | Status: DC | PRN

## 2022-12-07 MED ORDER — INSULIN GLARGINE-YFGN 100 UNIT/ML ~~LOC~~ SOLN
15.0000 [IU] | Freq: Every day | SUBCUTANEOUS | Status: DC
  Administered 2022-12-07: 15 [IU] via SUBCUTANEOUS
  Filled 2022-12-07: qty 0.15

## 2022-12-07 MED ORDER — POLYETHYLENE GLYCOL 3350 17 G PO PACK: 17.0000 g | PACK | Freq: Every day | ORAL | Status: DC | PRN

## 2022-12-07 MED ORDER — LISINOPRIL-HYDROCHLOROTHIAZIDE 10-12.5 MG PO TABS: 1.0000 | ORAL_TABLET | Freq: Every day | ORAL | Status: DC

## 2022-12-07 MED ORDER — METFORMIN HCL ER 500 MG PO TB24
500.0000 mg | ORAL_TABLET | Freq: Two times a day (BID) | ORAL | Status: DC
  Administered 2022-12-07: 500 mg via ORAL
  Filled 2022-12-07: qty 1

## 2022-12-07 MED ORDER — LISINOPRIL 10 MG PO TABS: 10.0000 mg | ORAL_TABLET | Freq: Every day | ORAL | 1 refills | Status: AC

## 2022-12-07 MED ORDER — METOPROLOL SUCCINATE ER 25 MG PO TB24
25.0000 mg | ORAL_TABLET | Freq: Two times a day (BID) | ORAL | Status: DC
  Administered 2022-12-07: 25 mg via ORAL
  Filled 2022-12-07: qty 1

## 2022-12-07 MED ORDER — HYDROCHLOROTHIAZIDE 12.5 MG PO TABS
12.5000 mg | ORAL_TABLET | Freq: Every day | ORAL | Status: DC
  Administered 2022-12-07: 12.5 mg via ORAL
  Filled 2022-12-07: qty 1

## 2022-12-07 MED ORDER — LISINOPRIL 10 MG PO TABS
10.0000 mg | ORAL_TABLET | Freq: Every day | ORAL | Status: DC
  Administered 2022-12-07: 10 mg via ORAL
  Filled 2022-12-07: qty 1

## 2022-12-07 MED ORDER — APIXABAN 5 MG PO TABS: 10.0000 mg | ORAL_TABLET | Freq: Two times a day (BID) | ORAL | 3 refills | Status: DC

## 2022-12-07 MED ORDER — CIPROFLOXACIN HCL 500 MG PO TABS: 500.0000 mg | ORAL_TABLET | Freq: Two times a day (BID) | ORAL | 0 refills | Status: AC

## 2022-12-07 MED ORDER — INSULIN GLARGINE-YFGN 100 UNIT/ML ~~LOC~~ SOLN: 18.0000 [IU] | Freq: Every day | SUBCUTANEOUS | Status: DC

## 2022-12-07 MED ORDER — CIPROFLOXACIN HCL 500 MG PO TABS: 500.0000 mg | ORAL_TABLET | Freq: Two times a day (BID) | ORAL | Status: DC

## 2022-12-07 NOTE — Discharge Instructions (Signed)
Foley care per protocol ?

## 2022-12-07 NOTE — Plan of Care (Signed)
  Problem: Fluid Volume: Goal: Hemodynamic stability will improve Outcome: Adequate for Discharge   Problem: Clinical Measurements: Goal: Diagnostic test results will improve Outcome: Adequate for Discharge Goal: Signs and symptoms of infection will decrease Outcome: Adequate for Discharge   Problem: Respiratory: Goal: Ability to maintain adequate ventilation will improve Outcome: Adequate for Discharge   Problem: Health Behavior/Discharge Planning: Goal: Ability to manage health-related needs will improve Outcome: Adequate for Discharge   Problem: Clinical Measurements: Goal: Ability to maintain clinical measurements within normal limits will improve Outcome: Adequate for Discharge Goal: Will remain free from infection Outcome: Adequate for Discharge Goal: Diagnostic test results will improve Outcome: Adequate for Discharge Goal: Respiratory complications will improve Outcome: Adequate for Discharge Goal: Cardiovascular complication will be avoided Outcome: Adequate for Discharge   Problem: Activity: Goal: Risk for activity intolerance will decrease Outcome: Adequate for Discharge   Problem: Nutrition: Goal: Adequate nutrition will be maintained Outcome: Adequate for Discharge   Problem: Coping: Goal: Level of anxiety will decrease Outcome: Adequate for Discharge   Problem: Elimination: Goal: Will not experience complications related to bowel motility Outcome: Adequate for Discharge Goal: Will not experience complications related to urinary retention Outcome: Adequate for Discharge   Problem: Pain Managment: Goal: General experience of comfort will improve Outcome: Adequate for Discharge   Problem: Safety: Goal: Ability to remain free from injury will improve Outcome: Adequate for Discharge   Problem: Skin Integrity: Goal: Risk for impaired skin integrity will decrease Outcome: Adequate for Discharge   Problem: Education: Goal: Ability to describe  self-care measures that may prevent or decrease complications (Diabetes Survival Skills Education) will improve Outcome: Adequate for Discharge Goal: Individualized Educational Video(s) Outcome: Adequate for Discharge   Problem: Coping: Goal: Ability to adjust to condition or change in health will improve Outcome: Adequate for Discharge   Problem: Fluid Volume: Goal: Ability to maintain a balanced intake and output will improve Outcome: Adequate for Discharge   Problem: Health Behavior/Discharge Planning: Goal: Ability to identify and utilize available resources and services will improve Outcome: Adequate for Discharge Goal: Ability to manage health-related needs will improve Outcome: Adequate for Discharge   Problem: Metabolic: Goal: Ability to maintain appropriate glucose levels will improve Outcome: Adequate for Discharge   Problem: Nutritional: Goal: Maintenance of adequate nutrition will improve Outcome: Adequate for Discharge Goal: Progress toward achieving an optimal weight will improve Outcome: Adequate for Discharge   Problem: Skin Integrity: Goal: Risk for impaired skin integrity will decrease Outcome: Adequate for Discharge   Problem: Tissue Perfusion: Goal: Adequacy of tissue perfusion will improve Outcome: Adequate for Discharge

## 2022-12-07 NOTE — TOC Transition Note (Signed)
Transition of Care [facility]) - CM/SW Discharge Note   Patient Details  Name: Alexander Wilcox MRN: [id number] Date of Birth: December 31, 1950  [facility] CM/SW Contact:  Chapman Fitch, RN Phone Number: 12/07/2022, 10:42 AM   Clinical Narrative:     [name] received for patient to return to [facility] today  Patient will DC to: [facility] Anticipated DC date: 12/07/22  Family notified:wife cindy at bedside updated Transport by: Wendie Simmer  Per MD patient ready for DC to . RN, patient, patient's family, and facility notified of DC. Discharge Summary sent to facility. RN given number for report. DC packet on chart. Ambulance transport requested for patient.   Signed script in dc packet  TOC signing off.  Bevelyn Ngo [facility] [phone]         Patient Goals and CMS Choice      Discharge Placement                         Discharge Plan and Services Additional resources added to the After Visit Summary for                                       Social Determinants of Health (SDOH) Interventions SDOH Screenings   Food Insecurity: No Food Insecurity (11/12/2022)  Housing: Patient Unable To Answer (11/12/2022)  Transportation Needs: No Transportation Needs (11/12/2022)  Utilities: Not At Risk (11/12/2022)  Tobacco Use: Medium Risk (12/02/2022)     Readmission Risk Interventions     No data to display

## 2022-12-07 NOTE — Discharge Summary (Addendum)
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   Complicated UTI (urinary tract infection)   Pulmonary embolus (HCC)   S/P dilation of urethra 11/12/22 with indwelling Foley   AKI (acute kidney injury) (HCC)   Acute metabolic encephalopathy   S/P fusion of thoracic spine 11/12/22 for traumatic T9 fracture   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 Patient went to rehab with Foley catheter. He was seen on follow-up with urology on 21st May and decision was to continue Foley catheter for now since patient was getting rehab and be removed on December 04, 2022.   Patient came in with increasing confusion, hematuria, tachycardic and was admitted with sepsis suspected due to UTI in the setting of Foley catheter. Patient was also noted to have  multiple pulmonary emboli on CT scan. pt on IV heparin drip.   CT head nonacute CT abdomen and pelvis showing cystitis and multilevel severe degenerative changes of the spine as well as evidence of pulmonary emboli CTA chest confirms multiple segmental and subsegmental filling defects in the right lower lobe compatible with multiple pulmonary emboli without evidence of right heart strain.   Catheter-associated urinary tract infection (HCC) History of bladder neck stricture requiring dilatation 11/12/2022 with indwelling Foley placed in the OR on 5/10 Prostate cancer on leuprolide Severe Sepsis --Patient presents with weakness, altered mental status in the setting of UTI on outpatient antibiotics.  Has tachycardia, leukocytosis with normal lactic acid.  AKI, altered mental status/metabolic encephalopathy --Sepsis fluids --Blood culture neg --UC >100K E coli --Foley was exchanged in the ED on 12/01/2022 -- infectious disease consultation with Dr. Bonne Dolores to cipro (end date 12/14/2022) -- sepsis improving   Multiple pulmonary emboli (HCC) Left Posterior tibial vein DVT --pt was on IV heparin infusion--now on po eliquis --Provoked PE from recent surgery, immobilization --Supplemental oxygen if needed --[place] LE noted to have left post tibial vein DVT   Acute metabolic encephalopathy/Delirium --Secondary to UTI and PE --CT head nonacute --Aspiration precautions   AKI (acute kidney injury) (HCC)/Hyponatremia --Creatinine 1.38--1.79--2.10--1.1 --baseline of 1.0 --Suspecting related to dehydration from poor oral intake --Will hold lisinopril,  metformin.  Avoid nephrotoxins. D/c HCTZ -- nephrology consultation with Dr. Cherylann Ratel --sodium 124--130--132--139 --resume bp meds since AKI resolved   S/P fusion of thoracic spine 11/12/22 for traumatic T9 fracture History of failed back  syndrome with prior cervical and lumbar fusions --Evaluated by Dr. Marcell Barlow in the  ED --Continue multimodal pain control with gabapentin, duloxetine and narcotics as needed once mentation better --pt will f/u 12/23/22   Acute blood loss anemia due to hematuria with UTI --baseline hgb 15.9--7.9--9.7   Hyponatremia Suspect dehydration from poor oral intake --As above --improved sodium   Uncontrolled type 2 diabetes mellitus with hyperglycemia, with long-term current use of insulin (HCC) --Blood sugar 263 ---Continue basal insulin, resumed metformin --Sliding scale insulin coverage   Carotid artery stenosis Possible prior TIA 11/12/2022 -CTA 11/12/22 showed Occluded right vertebral artery ...,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 -Hold aspirin while on systemic anticoagulation - Continue atorvastatin and fenofibrate   Frailty/Debility --Patient has recent prolonged hospitalization, recent back surgery with chronic comorbidities and current acute illnesses --Currently at SNF s/p recent hospitalization from 5/10 to 5/16 --PT recommendations appreciated. TOC for discharge planning. Pt will return back to [facility]  OSA on CPAP --Continue CPAP   Essential hypertension --Continue metoprolol --As needed hydralazine --resume lisinopril HCTZ  since hyponatremia and AKI improved    D/c to [facility]. D/w pt and wife   DVT prophylaxis: Heparin Code status: full Family communication :none today Consults : ID, neurosurgery, nephrology   Level of care: Med-Surg Status is: Inpatient Remains inpatient appropriate because: pt is medically improved and stable for discharge.    Pain control - [employer] Controlled Substance Reporting System database was reviewed. and patient was instructed, not to drive, operate heavy machinery, perform activities at heights, swimming or participation in water activities or provide baby-sitting services while on Pain, Sleep and Anxiety  Medications; until their outpatient Physician has advised to do so again. Also recommended to not to take more than prescribed Pain, Sleep and Anxiety Medications.   Discharge Diet Orders (From admission, onward)     Start     Ordered   12/07/22 0000  Diet - low sodium heart healthy        12/07/22 0921            DISCHARGE MEDICATION: Allergies as of 12/07/2022       Reactions   Dilaudid [hydromorphone Hcl] Itching        Medication List     STOP taking these medications    aspirin EC 81 MG tablet   diphenhydrAMINE 25 MG tablet Commonly known as: BENADRYL   doxycycline 100 MG capsule Commonly known as: VIBRAMYCIN       TAKE these medications    acetaminophen 325 MG tablet Commonly known as: TYLENOL Take 650 mg by mouth every 6 (six) hours as needed (acute pain).   apixaban 5 MG Tabs tablet Commonly known as: ELIQUIS Take 2 tablets (10 mg total) by mouth 2 (two) times daily. Then from 12/12/2022 take 5 mg twice a day   atorvastatin 40 MG tablet Commonly known as: LIPITOR Take 40 mg by mouth daily.   ciprofloxacin 500 MG tablet Commonly known as: CIPRO Take 1 tablet (500 mg total) by mouth 2 (two) times daily for 15 doses.   cyanocobalamin 1000 MCG tablet Take 1,000 mcg by mouth daily.   docusate sodium 100 MG capsule Commonly known as: COLACE Take 100 mg by mouth every 12 (twelve) hours as needed for mild constipation.   DULoxetine 30 MG capsule Commonly known as: CYMBALTA Take 1 capsule (30 mg total) by mouth daily.   fenofibrate 160 MG tablet  TAKE 1 TABLET DAILY   gabapentin 800 MG tablet Commonly known as: NEURONTIN Take 1 tablet (800 mg total) by mouth 3 (three) times daily.   insulin glargine-yfgn 100 UNIT/ML injection Commonly known as: SEMGLEE Inject 0.15 mLs (15 Units total) into the skin at bedtime. What changed: how much to take   Lancets 30G Misc 1 each.   lisinopril-hydrochlorothiazide 10-12.5 MG tablet Commonly known as:  ZESTORETIC Take 1 tablet by mouth daily.   metFORMIN 500 MG 24 hr tablet Commonly known as: GLUCOPHAGE-XR Take 1 tablet by mouth 2 (two) times daily. Pt taking one in the morning and one at night   metoprolol succinate 25 MG 24 hr tablet Commonly known as: TOPROL-XL Take 25 mg by mouth 2 (two) times daily.   nortriptyline 50 MG capsule Commonly known as: PAMELOR Take 50 mg by mouth at bedtime.   Omeprazole 20 MG Tbdd Take 40 mg by mouth daily.   oxyCODONE HCl 7.5 MG Tabs Take 7.5 mg by mouth every 6 (six) hours as needed for moderate pain. What changed:  medication strength how much to take when to take this reasons to take this Another medication with the same name was removed. Continue taking this medication, and follow the directions you see here.   polyethylene glycol 17 g packet Commonly known as: MIRALAX / GLYCOLAX Take 17 g by mouth daily as needed.   pregabalin 75 MG capsule Commonly known as: LYRICA Take 1 capsule (75 mg total) by mouth 2 (two) times daily.   tamsulosin 0.4 MG Caps capsule Commonly known as: FLOMAX Take 0.4 mg by mouth daily as needed (urinary retention).   UltiGuard SafePack Pen Needle 32G X 4 MM Misc Generic drug: Insulin Pen Needle Use 1 each once daily               Discharge Care Instructions  (From admission, onward)           Start     Ordered   12/07/22 0000  Discharge wound care:       Comments: Foam padding as needed   12/07/22 1610            Follow-up Information     Marisue Ivan, MD. Schedule an appointment as soon as possible for a visit in 1 week(s).   Specialty: Family Medicine Contact information: [phone] [facility] [address] [facility] [place] [address] [phone]         Carman Ching, PA-C. Go on 12/16/2022.   Specialty: Urology Why: at 9 am Contact information: [address] [place] [address] [phone]         Venetia Night, MD. Go on  12/23/2022.   Specialty: Neurosurgery Why: at 2:30 pm Contact information: [address] [place] [address] [phone]                Discharge Exam: Ceasar Mons Weights   12/01/22 1633 12/03/22 0332  Weight: 116.1 kg 113.5 kg   GENERAL:  72 y.o.-year-old patient with no acute distress. Obese LUNGS: Normal breath sounds bilaterally CARDIOVASCULAR: S1, S2 normal. No murmur  ABDOMEN: Soft, nontender, nondistended. Bowel sounds present. Chronic Foley catheter with clear urine EXTREMITIES: No  edema b/l.    NEUROLOGIC left LE weakness since surgery 11/12/22 patient is oriented to time place person   Condition at discharge: fair  The results of significant diagnostics from this hospitalization (including imaging, microbiology, ancillary and laboratory) are listed below for reference.   Imaging Studies:  US RENAL  Result Date: 12/03/2022 CLINICAL DATA:  [id number] Acute kidney failure (HCC) [id number] EXAM: RENAL / URINARY TRACT ULTRASOUND COMPLETE COMPARISON:  CT 12/01/2022 FINDINGS: Right Kidney: Renal measurements: 15.0 x 6.3 x 5.5 cm = volume: 267.6 mL. Mild pelviectasis, decreased from prior CT. Normal echogenicity. There is a 2.2 cm cystic lesion in the upper pole, poorly visualized. Left Kidney: Renal measurements: 13.1 x 6.5 x 6.1 cm = volume: 271.8 mL. Mild pelviectasis, decreased from prior CT. Normal echogenicity. Bladder: Decompressed with Foley catheter in place. Other: Hepatic steatosis. IMPRESSION: Mild renal pelviectasis bilaterally. Bladder is decompressed with Foley catheter in place. Electronically Signed   By: Caprice Renshaw M.D.   On: 12/03/2022 11:58   US Venous Img Lower Bilateral (DVT)  Result Date: 12/02/2022 CLINICAL DATA:  Shortness of breath EXAM: BILATERAL LOWER EXTREMITY VENOUS DOPPLER ULTRASOUND TECHNIQUE: Gray-scale sonography with graded compression, as well as color Doppler and duplex ultrasound were performed to evaluate the lower  extremity deep venous systems from the level of the common femoral vein and including the common femoral, femoral, profunda femoral, popliteal and calf veins including the posterior tibial, peroneal and gastrocnemius veins when visible. The superficial great saphenous vein was also interrogated. Spectral Doppler was utilized to evaluate flow at rest and with distal augmentation maneuvers in the common femoral, femoral and popliteal veins. COMPARISON:  None Available. FINDINGS: RIGHT LOWER EXTREMITY Common Femoral Vein: No evidence of thrombus. Normal compressibility, respiratory phasicity and response to augmentation. Saphenofemoral Junction: No evidence of thrombus. Normal compressibility and flow on color Doppler imaging. Profunda Femoral Vein: No evidence of thrombus. Normal compressibility and flow on color Doppler imaging. Femoral Vein: No evidence of thrombus. Normal compressibility, respiratory phasicity and response to augmentation. Popliteal Vein: No evidence of thrombus. Normal compressibility, respiratory phasicity and response to augmentation. Calf Veins: No evidence of thrombus. Normal compressibility and flow on color Doppler imaging. Superficial Great Saphenous Vein: No evidence of thrombus. Normal compressibility. Venous Reflux:  None. Other Findings:  None. LEFT LOWER EXTREMITY Common Femoral Vein: No evidence of thrombus. Normal compressibility, respiratory phasicity and response to augmentation. Saphenofemoral Junction: No evidence of thrombus. Normal compressibility and flow on color Doppler imaging. Profunda Femoral Vein: No evidence of thrombus. Normal compressibility and flow on color Doppler imaging. Femoral Vein: No evidence of thrombus. Normal compressibility, respiratory phasicity and response to augmentation. Popliteal Vein: No evidence of thrombus. Normal compressibility, respiratory phasicity and response to augmentation. Calf Veins: There is occlusive thrombus with noncompressibility  in one of the paired posterior tibial veins. Superficial Great Saphenous Vein: No evidence of thrombus. Normal compressibility. Venous Reflux:  None. Other Findings:  None. IMPRESSION: 1. Occlusive thrombus with noncompressibility in one of the paired posterior tibial veins on the left. 2. No evidence of DVT on the right. Electronically Signed   By: Lesia Hausen M.D.   On: 12/02/2022 09:52   DG Chest Port 1 View  Result Date: 12/02/2022 CLINICAL DATA:  Dyspnea. EXAM: PORTABLE CHEST 1 VIEW COMPARISON:  CTA chest yesterday at 10:19 p.m. FINDINGS: The heart size and mediastinal contours are within normal limits. Calcification is again noted in the transverse aorta. Both lungs are clear. The visualized skeletal structures are unremarkable apart from multilevel lower thoracic dorsal fusion rods and pedicle screws. IMPRESSION: No acute radiographic findings. Multiple right lower lobe emboli were noted however, on the CTA. Electronically Signed   By: Almira Bar M.D.   On: 12/02/2022 05:33   CT Angio Chest PE W and/or Wo Contrast  Result Date: 12/01/2022 CLINICAL DATA:  Pulmonary embolism (PE) suspected, high prob EXAM: CT ANGIOGRAPHY CHEST WITH CONTRAST TECHNIQUE: Multidetector CT imaging of the chest was performed using the standard protocol during bolus administration of intravenous contrast. Multiplanar CT image reconstructions and MIPs were obtained to evaluate the vascular anatomy. RADIATION DOSE REDUCTION: This exam was performed according to the departmental dose-optimization program which includes automated exposure control, adjustment of the mA and/or kV according to patient size and/or use of iterative reconstruction technique. CONTRAST:  75mL OMNIPAQUE IOHEXOL 350 MG/ML SOLN COMPARISON:  11/11/2022 FINDINGS: Cardiovascular: Multiple filling defects within right lower lobe pulmonary arterial branches compatible with multiple pulmonary emboli. No definite pulmonary emboli on the left. No evidence of  right heart strain. Coronary artery and aortic calcifications. No evidence of aortic aneurysm. Mediastinum/Nodes: No mediastinal, hilar, or axillary adenopathy. Trachea and esophagus are unremarkable. Thyroid unremarkable. Lungs/Pleura: Lungs are clear. No focal airspace opacities or suspicious nodules. No effusions. Upper Abdomen: No acute findings Musculoskeletal: Chest wall soft tissues are unremarkable. No acute bony abnormality. Postoperative changes in the thoracic spine from posterior fusion. Review of the MIP images confirms the above findings. IMPRESSION: Multiple segmental and subsegmental filling defects in the right lower lobe compatible with multiple pulmonary emboli. No evidence of right heart strain. Scattered aortic atherosclerosis and coronary artery disease. These results were called by telephone at the time of interpretation on 12/01/2022 at 10:25 pm to provider [facility] , who verbally acknowledged these results. Electronically Signed   By: Charlett Nose M.D.   On: 12/01/2022 22:27   CT ABDOMEN PELVIS W CONTRAST  Result Date: 12/01/2022 CLINICAL DATA:  Sepsis Abdominal pain, acute, nonlocalized EXAM: CT ABDOMEN AND PELVIS WITH CONTRAST TECHNIQUE: Multidetector CT imaging of the abdomen and pelvis was performed using the standard protocol following bolus administration of intravenous contrast. RADIATION DOSE REDUCTION: This exam was performed according to the departmental dose-optimization program which includes automated exposure control, adjustment of the mA and/or kV according to patient size and/or use of iterative reconstruction technique. CONTRAST:  OMNIPAQUE IOHEXOL 300 MG/ML  SOLN COMPARISON:  None Available. FINDINGS: Lower chest: Filling defects of the right lower lobe pulmonary arteries. No acute abnormality. Hepatobiliary: The liver is enlarged measuring up to 21.5 cm. No focal liver abnormality. Nonspecific hydropic gallbladder. No gallstones, gallbladder wall thickening,  or pericholecystic fluid. No biliary dilatation. Pancreas: No focal lesion. Normal pancreatic contour. No surrounding inflammatory changes. No main pancreatic ductal dilatation. Spleen: Normal in size without focal abnormality. Adrenals/Urinary Tract: No adrenal nodule bilaterally. Bilateral kidneys enhance symmetrically. No frank hydroureteronephrosis. Fullness of bilateral collecting systems. No nephroureterolithiasis. Circumferential urinary bladder wall thickening with perivesicular fat stranding. Foley catheter tip terminates within the urinary bladder lumen. Balloon not well visualized but likely inflated within the urinary bladder lumen. No inflated balloon within the urethra. No excretion of intravenous contrast from either kidneys on delayed view. Stomach/Bowel: Stomach is within normal limits. No evidence of bowel wall thickening or dilatation. Status post appendectomy. Vascular/Lymphatic: No abdominal aorta or iliac aneurysm. Severe atherosclerotic plaque of the aorta and its branches. No abdominal, pelvic, or inguinal lymphadenopathy. Reproductive: No mass. Other: No intraperitoneal free fluid. No intraperitoneal free gas. No organized fluid collection. Musculoskeletal: No abdominal wall hernia or abnormality. No suspicious lytic or blastic osseous lesions. No acute displaced fracture. Multilevel severe degenerative changes of the spine. Multilevel severe osseous neural foraminal stenosis. Thoracic spine surgical hardware partially visualized. IMPRESSION: 1. Right lower lobe segmental and subsegmental pulmonary emboli. Recommend CT angiography  pulmonary artery for further evaluation. 2. Cystitis. 3. No excretion of intravenous contrast from either kidneys on delayed view. Correlate with renal function. 4. Hepatomegaly. 5. Multilevel severe degenerative changes of the spine. Multilevel severe osseous neural foraminal stenosis. 6.  Aortic Atherosclerosis (ICD10-I70.0). These results were called by  telephone at the time of interpretation on 12/01/2022 at 9:13 pm to provider [facility] , who verbally acknowledged these results. Electronically Signed   By: Tish Frederickson M.D.   On: 12/01/2022 21:14   CT Head Wo Contrast  Result Date: 12/01/2022 CLINICAL DATA:  Altered mental status EXAM: CT HEAD WITHOUT CONTRAST TECHNIQUE: Contiguous axial images were obtained from the base of the skull through the vertex without intravenous contrast. RADIATION DOSE REDUCTION: This exam was performed according to the departmental dose-optimization program which includes automated exposure control, adjustment of the mA and/or kV according to patient size and/or use of iterative reconstruction technique. COMPARISON:  11/12/2022 FINDINGS: Brain: No acute intracranial findings are seen. There are no signs of bleeding within the cranium. Cortical sulci are prominent. There is no focal edema or mass effect. Ventricles are not dilated. Vascular: Unremarkable. Skull: No acute findings are seen. Degenerative changes are noted in upper cervical spine at C1-C2 level. Sinuses/Orbits: No acute findings are seen. Other: None. IMPRESSION: No acute intracranial findings are seen.  Atrophy. Electronically Signed   By: Ernie Avena M.D.   On: 12/01/2022 20:37   DG Chest Port 1 View  Result Date: 12/01/2022 CLINICAL DATA:  History of recent UTI treated with antibiotics, presenting with altered mental status. EXAM: PORTABLE CHEST 1 VIEW COMPARISON:  Nov 11, 2022 FINDINGS: The heart size and mediastinal contours are within normal limits. There is marked severity calcification of the aortic arch. Low lung volumes are noted. There is no evidence of an acute infiltrate, pleural effusion or pneumothorax. Interval postoperative changes are seen involving the mid to lower thoracic spine and upper lumbar spine, since the prior exam. A chronic deformity is seen involving the neck of the proximal left humerus. IMPRESSION: 1. Low lung volumes  without evidence of acute or active cardiopulmonary disease. 2. Interval postoperative changes within the thoracolumbar spine since the prior study. Electronically Signed   By: Aram Candela M.D.   On: 12/01/2022 20:05   DG Abd 2 Views  Result Date: 11/16/2022 CLINICAL DATA:  Abdominal distension. Recent spine surgery (11/12/2022) EXAM: ABDOMEN - 2 VIEW COMPARISON:  Lumbar CT 11/11/2022 FINDINGS: There is gaseous distention of the colon. Gas in the rectum. No small bowel dilatation identified. No pathologic calcification. Posterior thoracolumbar fusion IMPRESSION: Gaseous distention of the colon with gas in the rectum. No evidence of high-grade obstruction. Potential colonic ileus. Electronically Signed   By: Genevive Bi M.D.   On: 11/16/2022 15:32   MR BRAIN WO CONTRAST  Result Date: 11/12/2022 CLINICAL DATA:  Facial droop EXAM: MRI HEAD WITHOUT CONTRAST TECHNIQUE: Multiplanar, multiecho pulse sequences of the brain and surrounding structures were obtained without intravenous contrast. COMPARISON:  None Available. FINDINGS: Brain: No acute infarct, mass effect or extra-axial collection. No acute or chronic hemorrhage. Normal white matter signal, parenchymal volume and CSF spaces. The midline structures are normal. Vascular: Major flow voids are preserved. Skull and upper cervical spine: Normal calvarium and skull base. Visualized upper cervical spine and soft tissues are normal. Sinuses/Orbits:No paranasal sinus fluid levels or advanced mucosal thickening. No mastoid or middle ear effusion. Normal orbits. IMPRESSION: Normal brain MRI. Electronically Signed   By: Chrisandra Netters.D.  On: 11/12/2022 22:13   MR THORACIC SPINE WO CONTRAST  Result Date: 11/12/2022 CLINICAL DATA:  Trauma EXAM: MRI THORACIC SPINE WITHOUT CONTRAST TECHNIQUE: Multiplanar, multisequence MR imaging of the thoracic spine was performed. No intravenous contrast was administered. COMPARISON:  11/11/2022 thoracic spine CT and  11/12/2022 preoperative thoracic spine MRI FINDINGS: Alignment:  Physiologic. Vertebrae: There is now posterior instrumented fusion extending from T7-T12, traversing the site of T9-10 fracture. No new osseous abnormality. Cord:  Normal signal and morphology. Paraspinal and other soft tissues: Negative. Disc levels: Unchanged severe narrowing of the thecal sac at T6-7 and T7-8 IMPRESSION: Posterior instrumented fusion extending from T7-T12, traversing the site of T9-10 fracture. Alignment is normal. Electronically Signed   By: Deatra Robinson M.D.   On: 11/12/2022 22:11   CT ANGIO HEAD NECK W WO CM  Result Date: 11/12/2022 CLINICAL DATA:  Acute neurologic deficit. EXAM: CT ANGIOGRAPHY HEAD AND NECK WITH AND WITHOUT CONTRAST TECHNIQUE: Multidetector CT imaging of the head and neck was performed using the standard protocol during bolus administration of intravenous contrast. Multiplanar CT image reconstructions and MIPs were obtained to evaluate the vascular anatomy. Carotid stenosis measurements (when applicable) are obtained utilizing NASCET criteria, using the distal internal carotid diameter as the denominator. RADIATION DOSE REDUCTION: This exam was performed according to the departmental dose-optimization program which includes automated exposure control, adjustment of the mA and/or kV according to patient size and/or use of iterative reconstruction technique. CONTRAST:  75mL OMNIPAQUE IOHEXOL 350 MG/ML SOLN COMPARISON:  None Available. FINDINGS: CT HEAD FINDINGS Brain: There is no mass, hemorrhage or extra-axial collection. The size and configuration of the ventricles and extra-axial CSF spaces are normal. There is no acute or chronic infarction. The brain parenchyma is normal. Skull: The visualized skull base, calvarium and extracranial soft tissues are normal. Sinuses/Orbits: No fluid levels or advanced mucosal thickening of the visualized paranasal sinuses. No mastoid or middle ear effusion. The orbits are  normal. CTA NECK FINDINGS SKELETON: There is no bony spinal canal stenosis. No lytic or blastic lesion. OTHER NECK: Normal pharynx, larynx and major salivary glands. No cervical lymphadenopathy. Unremarkable thyroid gland. UPPER CHEST: No pneumothorax or pleural effusion. No nodules or masses. AORTIC ARCH: There is no calcific atherosclerosis of the aortic arch. There is no aneurysm, dissection or hemodynamically significant stenosis of the visualized portion of the aorta. Conventional 3 vessel aortic branching pattern. The visualized proximal subclavian arteries are widely patent. RIGHT CAROTID SYSTEM: No dissection, occlusion or aneurysm. Mild atherosclerotic calcification at the carotid bifurcation without hemodynamically significant stenosis. LEFT CAROTID SYSTEM: No dissection, occlusion or aneurysm. Mild atherosclerotic calcification at the carotid bifurcation without hemodynamically significant stenosis. VERTEBRAL ARTERIES: Left dominant configuration. There are streak artifacts obscuring much of the V2 segments. No visible opacification of the right V3 segment, which may be occluded. CTA HEAD FINDINGS POSTERIOR CIRCULATION: --Vertebral arteries: Occluded right V4 segment. Multifocal atherosclerotic calcification of the left V4 segment with severe stenosis. --Inferior cerebellar arteries: Patent --Basilar artery: Multifocal severe stenosis --Superior cerebellar arteries: Normal. --Posterior cerebral arteries (PCA): Predominantly fetal origin of the right PCA. There is severe stenosis of the proximal right P2 segment. Normal left PCA origin. Multifocal atherosclerotic irregularity. ANTERIOR CIRCULATION: --Intracranial internal carotid arteries: Atherosclerotic calcification bilaterally with severe stenosis of the supraclinoid segments. --Anterior cerebral arteries (ACA): Normal. Both A1 segments are present. Patent anterior communicating artery (a-comm). --Middle cerebral arteries (MCA): Multifocal mild  atherosclerotic irregularity without significant stenosis. VENOUS SINUSES: As permitted by contrast timing, patent. Review of the MIP  images confirms the above findings. IMPRESSION: 1. 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 Severe stenosis of the V4 segment of the left vertebral artery. 2. 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 3. Severe stenosis of the supraclinoid internal carotid arteries. 4. Mild bilateral carotid bifurcation atherosclerosis without hemodynamically significant stenosis. Electronically Signed   By: Deatra Robinson M.D.   On: 11/12/2022 20:01   DG Thoracic Spine 2 View  Result Date: 11/12/2022 CLINICAL DATA:  T7-T12 posterior fusion EXAM: THORACIC SPINE 2 VIEWS COMPARISON:  11/12/2022 FLUOROSCOPY: Exposure Index (as provided by the fluoroscopic device): 160.2 mGy FINDINGS: Intraoperative fluoroscopic and CT assistance was given during the performance of the procedure, and images are provided for interpretation only. Images demonstrate placement of posterior fusion rod and intra corporal screws spanning T7 through T12. Alignment is anatomic. Please refer to the operative report. IMPRESSION: 1. Intraoperative evaluation as above. Please refer to the operative report. Electronically Signed   By: Sharlet Salina M.D.   On: 11/12/2022 11:19   DG C-Arm 1-60 Min-No Report  Result Date: 11/12/2022 Fluoroscopy was utilized by the requesting physician.  No radiographic interpretation.   DG C-Arm 1-60 Min-No Report  Result Date: 11/12/2022 Fluoroscopy was utilized by the requesting physician.  No radiographic interpretation.   MR THORACIC SPINE WO CONTRAST  Result Date: 11/12/2022 CLINICAL DATA:  Trauma EXAM: MRI THORACIC SPINE WITHOUT CONTRAST TECHNIQUE: Multiplanar, multisequence MR imaging of the thoracic spine was performed. No intravenous contrast was administered. COMPARISON:  CT thoracic spine 11/11/2022 FINDINGS: Alignment:   Normal Vertebrae: There is diffuse ankylosis of the thoracic spine. There is a fracture that traverses the anterior T9-10 disc space and extends through the inferior endplate and posterior wall of T9. The fracture extends through the bilateral posterior elements. The anterior longitudinal ligament is torn. Incidental T11 hemangioma. Cord: Normal signal. Severe mass effect on the spinal cord at the T6-8 levels. Paraspinal and other soft tissues: Small prevertebral effusion at the T8-12 levels. Disc levels: There is dorsal epidural lipomatosis throughout the thoracic spine. There is right dorsal spinal canal ossification at the T5-7 levels as shown on the earlier CT. There is severe attenuation of the thecal sac at T6-7 and T7-8 with marked mass effect on the spinal cord due to combination of central disc protrusions and epidural lipomatosis. Moderate thecal sac attenuation at T8-9. IMPRESSION: 1. Diffuse ankylosis of the thoracic spine with fracture through the anterior T9-10 disc space and extending through the inferior endplate, posterior wall and bilateral posterior elements of T9. 2. 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 3. Severe attenuation of the thecal sac at T6-7 and T7-8 with severe mass effect on the spinal cord due to combination of central disc protrusions and epidural lipomatosis. Moderate thecal sac attenuation at T8-9. Electronically Signed   By: Deatra Robinson M.D.   On: 11/12/2022 01:46   DG Ankle Complete Left  Result Date: 11/11/2022 CLINICAL DATA:  Status post trauma. EXAM: LEFT ANKLE COMPLETE - 3+ VIEW COMPARISON:  None Available. FINDINGS: There is no evidence of an acute fracture, dislocation, or joint effusion. A chronic fracture deformity is seen involving the left medial malleolus. Mild lateral soft tissue swelling is noted. Mild to moderate severity calcification of the plantar fascia of the proximal left foot is also seen. IMPRESSION: Mild  lateral soft tissue swelling without evidence of acute fracture or dislocation. Electronically Signed   By: Demetrius Revel.D.  On: 11/11/2022 23:13   DG Knee Complete 4 Views Right  Result Date: 11/11/2022 CLINICAL DATA:  Status post trauma. EXAM: RIGHT KNEE - COMPLETE 4+ VIEW COMPARISON:  None Available. FINDINGS: No evidence of an acute fracture or dislocation. Marked severity patellofemoral, medial tibiofemoral and lateral tibiofemoral compartment space narrowing is seen. Medial and lateral marginal osteophytes are also noted. There is a small to moderate sized joint effusion. IMPRESSION: 1. Marked severity tricompartmental osteoarthritis. 2. Small to moderate sized joint effusion. Electronically Signed   By: Aram Candela M.D.   On: 11/11/2022 23:12   CT CHEST ABDOMEN PELVIS W CONTRAST  Result Date: 11/11/2022 CLINICAL DATA:  Lump poly trauma. Crush injury. EXAM: CT CHEST, ABDOMEN, AND PELVIS WITH CONTRAST TECHNIQUE: Multidetector CT imaging of the chest, abdomen and pelvis was performed following the standard protocol during bolus administration of intravenous contrast. RADIATION DOSE REDUCTION: This exam was performed according to the departmental dose-optimization program which includes automated exposure control, adjustment of the mA and/or kV according to patient size and/or use of iterative reconstruction technique. CONTRAST:  OMNIPAQUE IOHEXOL 300 MG/ML  SOLN COMPARISON:  Chest radiograph earlier today. FINDINGS: CT CHEST FINDINGS Cardiovascular: No evidence of acute aortic or vascular injury. Aortic atherosclerosis. Normal heart size. No central pulmonary embolus. No pericardial effusion. Mediastinum/Nodes: No mediastinal hemorrhage or hematoma. No pneumomediastinum. No esophageal wall thickening. No adenopathy. Lungs/Pleura: No pneumothorax. There is a trace right pleural effusion. No evidence of pulmonary contusion or focal airspace disease. The trachea and central airways are  patent. Musculoskeletal: Thoracic spine injury is assessed on concurrent thoracic spine reformats, reported separately. No acute fracture of the ribs, included clavicles or shoulder girdles or sternum. There is no confluent chest wall soft tissue contusion. CT ABDOMEN PELVIS FINDINGS Hepatobiliary: No hepatic injury or perihepatic hematoma. Diffuse hepatic steatosis. The liver is enlarged spanning 23.6 cm cranial caudal. Gallbladder is unremarkable. Pancreas: No evidence of injury. No ductal dilatation or inflammation. Spleen: No splenic injury or perisplenic hematoma. Splenomegaly spleen spanning at least 13.9 cm cranial caudal. Adrenals/Urinary Tract: No adrenal hemorrhage or renal injury identified. Small cyst in the right kidney needs no further imaging follow-up. No hydronephrosis. Bladder is unremarkable. Stomach/Bowel: There is no evidence of bowel injury or mesenteric hematoma. No bowel wall thickening or inflammation. Prior appendectomy. Vascular/Lymphatic: Aortic atherosclerosis. No evidence of vascular injury. No retroperitoneal fluid. Patent portal vein. No suspicious adenopathy. Reproductive: Presumed prostatectomy. Other: No free air or free fluid. Fat in the left inguinal canal. There is no confluent body wall contusion. Musculoskeletal: Lumbar spine assessed on concurrent lumbar spine reformats, reported separately. Partial fusion of the sacroiliac joints. No pelvic fracture. IMPRESSION: 1. No evidence of acute traumatic injury to the chest, abdomen, or pelvis. Please reference concurrent thoracic spine reformats for thoracic spine injury assessment. 2. Trace right pleural effusion. 3. Hepatosplenomegaly and hepatic steatosis. Aortic Atherosclerosis (ICD10-I70.0). Electronically Signed   By: Narda Rutherford M.D.   On: 11/11/2022 22:48   CT T-SPINE NO CHARGE  Result Date: 11/11/2022 CLINICAL DATA:  Trauma, crush injury. EXAM: CT THORACIC SPINE WITHOUT CONTRAST TECHNIQUE: Multidetector CT images of  the thoracic were obtained using the standard protocol without intravenous contrast. RADIATION DOSE REDUCTION: This exam was performed according to the departmental dose-optimization program which includes automated exposure control, adjustment of the mA and/or kV according to patient size and/or use of iterative reconstruction technique. COMPARISON:  Thoracic spine radiograph 11/10/2020 FINDINGS: Alignment: There is widening of the anterior aspect of T9-T10 disc space. Vertebrae: There  is diffuse thoracic ankylosis flowing anterior osteophytes/syndesmophytes. Distraction injury at T9. There is an acute fracture through the posteroinferior aspect of T9 vertebral body extending to the T9-T10 disc space and into the posterior elements bilaterally. There is also a transverse process fracture on the right at T10. Possible small fracture through the T9 spinous process posteriorly. Incidental vertebral body hemangioma at T11. Paraspinal and other soft tissues: Anterior paraspinal soft tissue thickening at the level of distraction injury. Fully assessed on concurrent chest CT, reported separately. Disc levels: Diffuse flowing anterior osteophytes and syndesmophytes with thoracic ankylosis. There is posterior ossification possibly related to facet changes at the level of T6 and T7 that cause spinal canal stenosis. IMPRESSION: 1. Distraction injury at T9 with acute fracture through the posteroinferior aspect of T9 vertebral body extending to the T9-T10 disc space and into the posterior elements bilaterally. There is also a transverse process fracture on the right at T10. Possible small fracture through the T9 spinous process posteriorly. Consider MRI for more detailed assessment. 2. Diffuse thoracic ankylosis with flowing anterior osteophytes/syndesmophytes. 3. Posterior ossification possibly related to facet changes at the level of T6 and T7 that cause spinal canal stenosis. Electronically Signed   By: Narda Rutherford M.D.    On: 11/11/2022 22:41   CT Cervical Spine Wo Contrast  Result Date: 11/11/2022 CLINICAL DATA:  Crush injury. Lawnmower rolled backwards on landed on his chest. EXAM: CT HEAD WITHOUT CONTRAST CT CERVICAL SPINE WITHOUT CONTRAST TECHNIQUE: Multidetector CT imaging of the head and cervical spine was performed following the standard protocol without intravenous contrast. Multiplanar CT image reconstructions of the cervical spine were also generated. RADIATION DOSE REDUCTION: This exam was performed according to the departmental dose-optimization program which includes automated exposure control, adjustment of the mA and/or kV according to patient size and/or use of iterative reconstruction technique. COMPARISON:  PET/CT 12/13/2019 and MRI cervical spine 12/10/2020 FINDINGS: CT HEAD FINDINGS Brain: No intracranial hemorrhage, mass effect, or evidence of acute infarct. No hydrocephalus. No extra-axial fluid collection. Vascular: No hyperdense vessel. Intracranial arterial calcification. Skull: No fracture or focal lesion. Sinuses/Orbits: No acute finding. Paranasal sinuses and mastoid air cells are well aerated. Other: None. CT CERVICAL SPINE FINDINGS Alignment: No evidence of traumatic malalignment. Skull base and vertebrae: No acute fracture. No primary bone lesion or focal pathologic process. Soft tissues and spinal canal: No prevertebral fluid or swelling. No visible canal hematoma. Disc levels: Posterior fusion C3-C4. Multilevel advanced spondylosis, displaced height loss, degenerative endplate changes. Fusion of anterior osteophytes. 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 This causes moderate spinal canal narrowing. Uncovertebral spurring and facet arthropathy cause multilevel moderate neural foraminal narrowing. Upper chest: No acute abnormality. Other: Carotid calcification IMPRESSION: 1. No acute intracranial abnormality. 2. No acute fracture in the cervical spine. Multilevel  degenerative spondylosis. Electronically Signed   By: Minerva Fester M.D.   On: 11/11/2022 22:36   CT Head Wo Contrast  Result Date: 11/11/2022 CLINICAL DATA:  Crush injury. Lawnmower rolled backwards on landed on his chest. EXAM: CT HEAD WITHOUT CONTRAST CT CERVICAL SPINE WITHOUT CONTRAST TECHNIQUE: Multidetector CT imaging of the head and cervical spine was performed following the standard protocol without intravenous contrast. Multiplanar CT image reconstructions of the cervical spine were also generated. RADIATION DOSE REDUCTION: This exam was performed according to the departmental dose-optimization program which includes automated exposure control, adjustment of the mA and/or kV according to patient size and/or use of iterative reconstruction technique. COMPARISON:  PET/CT 12/13/2019 and MRI cervical  spine 12/10/2020 FINDINGS: CT HEAD FINDINGS Brain: No intracranial hemorrhage, mass effect, or evidence of acute infarct. No hydrocephalus. No extra-axial fluid collection. Vascular: No hyperdense vessel. Intracranial arterial calcification. Skull: No fracture or focal lesion. Sinuses/Orbits: No acute finding. Paranasal sinuses and mastoid air cells are well aerated. Other: None. CT CERVICAL SPINE FINDINGS Alignment: No evidence of traumatic malalignment. Skull base and vertebrae: No acute fracture. No primary bone lesion or focal pathologic process. Soft tissues and spinal canal: No prevertebral fluid or swelling. No visible canal hematoma. Disc levels: Posterior fusion C3-C4. Multilevel advanced spondylosis, displaced height loss, degenerative endplate changes. Fusion of anterior osteophytes. 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 This causes moderate spinal canal narrowing. Uncovertebral spurring and facet arthropathy cause multilevel moderate neural foraminal narrowing. Upper chest: No acute abnormality. Other: Carotid calcification IMPRESSION: 1. No acute intracranial  abnormality. 2. No acute fracture in the cervical spine. Multilevel degenerative spondylosis. Electronically Signed   By: Minerva Fester M.D.   On: 11/11/2022 22:36   CT L-SPINE NO CHARGE  Result Date: 11/11/2022 CLINICAL DATA:  Crush injury, lawnmower accident. EXAM: CT LUMBAR SPINE WITHOUT CONTRAST TECHNIQUE: Multidetector CT imaging of the lumbar spine was performed without intravenous contrast administration. Multiplanar CT image reconstructions were also generated. RADIATION DOSE REDUCTION: This exam was performed according to the departmental dose-optimization program which includes automated exposure control, adjustment of the mA and/or kV according to patient size and/or use of iterative reconstruction technique. COMPARISON:  Lumbar radiograph 11/10/2020 FINDINGS: Segmentation: 5 lumbar type vertebrae. Alignment: Normal. Vertebrae: No acute fracture. Vertebral body heights are normal. The posterior elements are intact. Flowing anterior osteophytes throughout the lumbar spine. Paraspinal and other soft tissues: Assessed on concurrent abdominopelvic CT, reported separately. No lumbar paraspinal hematoma. Disc levels: Diffuse flowing anterior osteophytes and syndesmophytes. The disc spaces are relatively well preserved. There is mild posterior spurring at L5-S1. Moderate diffuse facet hypertrophy. Spinal canal stenosis at L2-L3, L3-L4, and L4-L5. prior right L5 laminectomy. IMPRESSION: 1. No acute fracture or subluxation of the lumbar spine. 2. Flowing anterior osteophytes and syndesmophytes throughout the lumbar spine may represent ankylosis or diffuse idiopathic skeletal hyperostosis. 3. Spinal canal stenosis at L2-L3, L3-L4 and L4-L5. Electronically Signed   By: Narda Rutherford M.D.   On: 11/11/2022 22:32   DG Chest Port 1 View  Result Date: 11/11/2022 CLINICAL DATA:  Status post trauma. EXAM: PORTABLE CHEST 1 VIEW COMPARISON:  December 27, 2007 FINDINGS: The heart size and mediastinal contours are  within normal limits. There is marked severity calcification of the aortic arch. Low lung volumes are noted. Both lungs are clear. Multilevel degenerative changes seen throughout the thoracic spine. IMPRESSION: Low lung volumes without active cardiopulmonary disease. Electronically Signed   By: Aram Candela M.D.   On: 11/11/2022 22:13   DG Elbow 2 Views Left  Result Date: 11/11/2022 CLINICAL DATA:  Status post trauma. EXAM: LEFT ELBOW - 2 VIEW COMPARISON:  None Available. FINDINGS: A small linear lucency of indeterminate age is seen involving the lateral aspect of the left radial head. There is no evidence of dislocation chronic and degenerative changes are seen involving the medial epicondyle of the distal left humerus and volar aspect of the proximal left radius. Mild to moderate severity bony spurring is seen involving the left olecranon process. Soft tissues are unremarkable. IMPRESSION: Findings which may represent a small left radial head fracture of indeterminate age. Correlation with physical examination is recommended to determine the presence of point tenderness. Additional CT evaluation  is recommended if acute fracture remains of clinical concern. Electronically Signed   By: Aram Candela M.D.   On: 11/11/2022 22:12    Microbiology: Results for orders placed or performed during the hospital encounter of 12/01/22  Blood Culture (routine x 2)     Status: None   Collection Time: 12/01/22  7:33 PM   Specimen: BLOOD  Result Value Ref Range Status   Specimen Description BLOOD BLOOD RIGHT ARM  Final   Special Requests   Final    BOTTLES DRAWN AEROBIC AND ANAEROBIC Blood Culture adequate volume   Culture   Final    NO GROWTH 5 DAYS Performed at [facility], [address] [place], [place] [address]    Report Status 12/06/2022 FINAL  Final  Blood Culture (routine x 2)     Status: None   Collection Time: 12/01/22  7:33 PM   Specimen: BLOOD  Result Value Ref Range Status    Specimen Description BLOOD BLOOD LEFT ARM  Final   Special Requests   Final    BOTTLES DRAWN AEROBIC AND ANAEROBIC Blood Culture results may not be optimal due to an excessive volume of blood received in culture bottles   Culture   Final    NO GROWTH 5 DAYS Performed at [facility], [address] [place], [place] [address]    Report Status 12/06/2022 FINAL  Final  Remove and replace urinary cath (placed > 5 days) then obtain urine culture from new indwelling urinary catheter.     Status: Abnormal   Collection Time: 12/01/22  7:33 PM   Specimen: Urine, Catheterized  Result Value Ref Range Status   Specimen Description   Final    URINE, CATHETERIZED Performed at [facility], [address] [place], [place] [address]    Special Requests   Final    NONE Performed at [facility], [address] [place], [place] [address]    Culture >=100,000 COLONIES/mL ESCHERICHIA COLI (A)  Final   Report Status 12/04/2022 FINAL  Final   Organism ID, Bacteria ESCHERICHIA COLI (A)  Final      Susceptibility   Escherichia coli - MIC*    AMPICILLIN >=32 RESISTANT Resistant     CEFAZOLIN <=4 SENSITIVE Sensitive     CEFEPIME <=0.12 SENSITIVE Sensitive     CEFTRIAXONE <=0.25 SENSITIVE Sensitive     CIPROFLOXACIN <=0.25 SENSITIVE Sensitive     GENTAMICIN <=1 SENSITIVE Sensitive     IMIPENEM <=0.25 SENSITIVE Sensitive     NITROFURANTOIN <=16 SENSITIVE Sensitive     TRIMETH/SULFA <=20 SENSITIVE Sensitive     AMPICILLIN/SULBACTAM 16 INTERMEDIATE Intermediate     PIP/TAZO <=4 SENSITIVE Sensitive     * >=100,000 COLONIES/mL ESCHERICHIA COLI  MRSA Next Gen by PCR, Nasal     Status: None   Collection Time: 12/02/22  8:42 AM   Specimen: Nasal Mucosa; Nasal Swab  Result Value Ref Range Status   MRSA by PCR Next Gen NOT DETECTED NOT DETECTED Final    Comment: (NOTE) The GeneXpert MRSA Assay (FDA approved for NASAL specimens only), is one component of a comprehensive MRSA  colonization surveillance program. It is not intended to diagnose MRSA infection nor to guide or monitor treatment for MRSA infections. Test performance is not FDA approved in patients less than 50 years old. Performed at [facility], [address], [place], [place] [address]   C Difficile Quick Screen w PCR reflex     Status: None  Collection Time: 12/03/22 12:52 PM   Specimen: STOOL  Result Value Ref Range Status   C Diff antigen NEGATIVE NEGATIVE Final   C Diff toxin NEGATIVE NEGATIVE Final   C Diff interpretation No C. difficile detected.  Final    Comment: Performed at [facility], [address] [place], [place] [address]    Labs: CBC: Recent Labs  Lab 12/02/22 [phone] 12/03/22 0210 12/03/22 0720 12/04/22 0839 12/05/22 0154 12/06/22 0433  WBC 15.4* 14.5*  --  5.7 3.7* 2.7*  HGB 10.9* 10.1* 9.7* 8.7* 7.9* 9.1*  HCT 31.2* 30.2*  --  26.0* 23.4* 27.3*  MCV 83.2 84.6  --  84.7 84.5 84.5  PLT 268 254  --  227 204 202   Basic Metabolic Panel: Recent Labs  Lab 12/02/22 0513 12/03/22 0210 12/04/22 0839 12/04/22 0840 12/07/22 0438  NA 124* 130* 133* 132* 139  K 4.3 3.9 3.6 3.6 3.7  CL 91* 94* 102 101 103  CO2 24 22 25 24 27   GLUCOSE 286* 221* 157* 154* 167*  BUN 33* 38* 21 21 14   CREATININE 1.79* 2.10* 1.11 1.11 0.88  CALCIUM 8.4* 8.4* 8.6* 8.5* 8.9  PHOS  --   --   --  2.4*  --    Liver Function Tests: Recent Labs  Lab 12/01/22 1933 12/04/22 0840  AST 17  --   ALT 26  --   ALKPHOS 114  --   BILITOT 1.3*  --   PROT 7.7  --   ALBUMIN 3.5 2.3*   CBG: Recent Labs  Lab 12/06/22 0719 12/06/22 1153 12/06/22 1643 12/06/22 2115 12/07/22 0800  GLUCAP 140* 180* 168* 127* 156*    Discharge time spent: greater than 30 minutes.  Signed: Enedina Finner, MD [facility] 12/07/2022

## 2022-12-08 NOTE — Telephone Encounter (Signed)
Mrs Kintz called back, states physical therapist came in this morning to see the patient for rehab exercises at [facility] where patient resides right now and noticed that patient has swelling of his penis. Patient does have catheter in. Mrs Sydney came to check on patient and to clean the catheter area and saw that his foreskin is swollen/red, she is not able to pull the skin back enough to even try and clean around the opening of the penis. Skin will not retract much, just a little. He is not having a fever. He is still on antibiotics from the recent hospitalization. Please advise. CB to Mrs Claussen is [phone]. I did confirm with Mrs Demore that this is not paraphimosis.  Spoke with Dr Apolinar Junes and was advised that patient needs to be seen tomorrow 12/09/22. Mrs Marso advised and I called [facility] and arranged patient's appointment for 12/09/22

## 2022-12-09 ENCOUNTER — Ambulatory Visit: Payer: Medicare HMO | Admitting: Physician Assistant

## 2022-12-09 VITALS — BP 99/62 | HR 80 | Ht 68.0 in | Wt 250.0 lb

## 2022-12-09 DIAGNOSIS — R1909 Other intra-abdominal and pelvic swelling, mass and lump: Secondary | ICD-10-CM

## 2022-12-09 DIAGNOSIS — I10 Essential (primary) hypertension: Secondary | ICD-10-CM | POA: Diagnosis not present

## 2022-12-09 DIAGNOSIS — L304 Erythema intertrigo: Secondary | ICD-10-CM

## 2022-12-09 MED ORDER — NYSTATIN 100000 UNIT/GM EX POWD
1.0000 | Freq: Two times a day (BID) | CUTANEOUS | 2 refills | Status: DC
Start: 2022-12-09 — End: 2023-02-14

## 2022-12-09 NOTE — Progress Notes (Signed)
12/09/2022 5:20 PM   Alexander Wilcox [birth year] [id number]  CC: Chief Complaint  Patient presents with   Follow-up    penile swelling-has cath in   HPI: Alexander Wilcox is a 72 y.o. 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  He is accompanied today by his wife, Alexander Wilcox, who contributes to HPI.  Today Alexander Wilcox reports that she has been trying to care for his Foley catheter but has been unable to retract his foreskin due to swelling.  He denies any penile pain or fevers.  He remains on antibiotics for UTI.  He also reports some lateral scrotal irritation where he feels that his scrotal skin is sticking to his thigh and causing some discomfort.  PMH: Past Medical History:  Diagnosis Date   Arthritis    Diabetes mellitus without complication (HCC)    Hypertension    Prostate cancer (HCC)    Sleep apnea     Surgical History: Past Surgical History:  Procedure Laterality Date   APPENDECTOMY     BACK SURGERY     CYSTOSCOPY  11/12/2022   Procedure: Cystoscopy, dilation of bladder neck contracture, complex Foley catheter placement;  Surgeon: Sondra Come, MD;  Location: [facility] ORS;  Service: Urology;;   KNEE ARTHROPLASTY     PROSTATE SURGERY      Home Medications:  Allergies as of 12/09/2022       Reactions   Dilaudid [hydromorphone Hcl] Itching        Medication List        Accurate as of December 09, 2022  5:20 PM. If you have any questions, ask your nurse or doctor.          STOP taking these medications    docusate sodium 100 MG capsule Commonly known as: COLACE   polyethylene glycol 17 g packet Commonly known as: MIRALAX / GLYCOLAX       TAKE these medications    acetaminophen 325 MG  tablet Commonly known as: TYLENOL Take 650 mg by mouth every 6 (six) hours as needed (acute pain).   apixaban 5 MG Tabs tablet Commonly known as: ELIQUIS Take 2 tablets (10 mg total) by mouth 2 (two) times daily. Then from 12/12/2022 take 5 mg twice a day   atorvastatin 40 MG tablet Commonly known as: LIPITOR Take 40 mg by mouth daily.   ciprofloxacin 500 MG tablet Commonly known as: CIPRO Take 1 tablet (500 mg total) by mouth 2 (two) times daily for 15 doses.   cyanocobalamin 1000 MCG tablet Take 1,000 mcg by mouth daily.   DULoxetine 30 MG capsule Commonly known as: CYMBALTA Take 1 capsule (30 mg total) by mouth daily.   fenofibrate 160 MG tablet TAKE 1 TABLET DAILY   gabapentin 800 MG tablet Commonly known as: NEURONTIN Take 1 tablet (800 mg total) by mouth 3 (three) times daily.   insulin glargine-yfgn 100 UNIT/ML injection Commonly known as: SEMGLEE Inject 0.15 mLs (15 Units total) into the skin at bedtime.   Lancets 30G Misc 1 each.   lisinopril 10 MG tablet Commonly known as: ZESTRIL Take 1 tablet (10 mg total) by mouth daily.   metFORMIN 500 MG 24 hr tablet Commonly known as: GLUCOPHAGE-XR Take 1 tablet by mouth 2 (two) times daily. Pt  taking one in the morning and one at night   metoprolol succinate 25 MG 24 hr tablet Commonly known as: TOPROL-XL Take 25 mg by mouth 2 (two) times daily.   nortriptyline 50 MG capsule Commonly known as: PAMELOR Take 50 mg by mouth at bedtime.   nystatin powder Commonly known as: MYCOSTATIN/NYSTOP Apply 1 Application topically 2 (two) times daily. Apply to groin to absorb moisture and prevent yeast infection.   Omeprazole 20 MG Tbdd Take 40 mg by mouth daily.   oxyCODONE HCl 7.5 MG Tabs Take 7.5 mg by mouth every 6 (six) hours as needed.   pregabalin 75 MG capsule Commonly known as: LYRICA Take 1 capsule (75 mg total) by mouth 2 (two) times daily.   tamsulosin 0.4 MG Caps capsule Commonly known as: FLOMAX Take  0.4 mg by mouth daily as needed (urinary retention).   UltiGuard SafePack Pen Needle 32G X 4 MM Misc Generic drug: Insulin Pen Needle Use 1 each once daily        Allergies:  Allergies  Allergen Reactions   Dilaudid [Hydromorphone Hcl] Itching    Family History: Family History  Problem Relation Age of Onset   Diabetes Sister    Diabetes Maternal Grandmother    Lung disease Brother    Kidney failure Sister     Social History:   reports that he quit smoking about 26 years ago. His smoking use included cigarettes. He has a 60.00 pack-year smoking history. He has never used smokeless tobacco. He reports current alcohol use. He reports that he does not currently use drugs.  Physical Exam: BP 99/62   Pulse 80   Ht 5\' 8"  (1.727 m)   Wt 250 lb (113.4 kg)   BMI 38.01 kg/m   Constitutional:  Alert and oriented, no acute distress, nontoxic appearing HEENT: [place], AT Cardiovascular: No clubbing, cyanosis, or edema Respiratory: Normal respiratory effort, no increased work of breathing GU: Buried penis.  Phimotic, edematous foreskin without erythema or fluctuance.  Bilateral descended testicles.  No scrotal edema, erythema, fluctuance, crepitus, purulence, or malodor.  Scrotal skin is intact. Skin: No rashes, bruises or suspicious lesions Neurologic: Grossly intact, no focal deficits, moving all 4 extremities Psychiatric: Normal mood and affect  Assessment & Plan:   1. Groin swelling I did appreciate some edema of his foreskin that is contributing to phimosis.  This appears to be dependent edema and I did not appreciate any evidence of chronic inflammation or cellulitic changes.  I expect this will resolve on its own with time.  2. Intertriginous dermatitis associated with moisture Left lateral scrotal irritation where his scrotal skin meets his thigh.  Will prescribe nystatin powder for moisture control and to prevent fungal infection in the setting of antibiotics, though I did not  see evidence of active infection today.  Notably, I did not see any changes of the scrotum that would be consistent with a Fournier's gangrene. - nystatin (MYCOSTATIN/NYSTOP) powder; Apply 1 Application topically 2 (two) times daily. Apply to groin to absorb moisture and prevent yeast infection.  Dispense: 15 g; Refill: 2  Return for Voiding trial as scheduled.  Carman Ching, PA-C  [facility] [place] [address] [place], [place] [address] [phone]

## 2022-12-10 DIAGNOSIS — S22078D Other fracture of T9-T10 vertebra, subsequent encounter for fracture with routine healing: Secondary | ICD-10-CM | POA: Diagnosis not present

## 2022-12-10 DIAGNOSIS — Z7984 Long term (current) use of oral hypoglycemic drugs: Secondary | ICD-10-CM | POA: Diagnosis not present

## 2022-12-10 DIAGNOSIS — D62 Acute posthemorrhagic anemia: Secondary | ICD-10-CM | POA: Diagnosis not present

## 2022-12-10 DIAGNOSIS — N320 Bladder-neck obstruction: Secondary | ICD-10-CM | POA: Diagnosis not present

## 2022-12-10 DIAGNOSIS — G4733 Obstructive sleep apnea (adult) (pediatric): Secondary | ICD-10-CM | POA: Diagnosis not present

## 2022-12-10 DIAGNOSIS — E871 Hypo-osmolality and hyponatremia: Secondary | ICD-10-CM | POA: Diagnosis not present

## 2022-12-10 DIAGNOSIS — Z794 Long term (current) use of insulin: Secondary | ICD-10-CM | POA: Diagnosis not present

## 2022-12-10 DIAGNOSIS — I10 Essential (primary) hypertension: Secondary | ICD-10-CM | POA: Diagnosis not present

## 2022-12-10 DIAGNOSIS — E1142 Type 2 diabetes mellitus with diabetic polyneuropathy: Secondary | ICD-10-CM | POA: Diagnosis not present

## 2022-12-13 DIAGNOSIS — G4733 Obstructive sleep apnea (adult) (pediatric): Secondary | ICD-10-CM | POA: Diagnosis not present

## 2022-12-13 DIAGNOSIS — E1142 Type 2 diabetes mellitus with diabetic polyneuropathy: Secondary | ICD-10-CM | POA: Diagnosis not present

## 2022-12-13 DIAGNOSIS — D500 Iron deficiency anemia secondary to blood loss (chronic): Secondary | ICD-10-CM | POA: Diagnosis not present

## 2022-12-13 DIAGNOSIS — S22078D Other fracture of T9-T10 vertebra, subsequent encounter for fracture with routine healing: Secondary | ICD-10-CM | POA: Diagnosis not present

## 2022-12-13 DIAGNOSIS — Z7984 Long term (current) use of oral hypoglycemic drugs: Secondary | ICD-10-CM | POA: Diagnosis not present

## 2022-12-13 DIAGNOSIS — R5383 Other fatigue: Secondary | ICD-10-CM | POA: Diagnosis not present

## 2022-12-13 DIAGNOSIS — Z794 Long term (current) use of insulin: Secondary | ICD-10-CM | POA: Diagnosis not present

## 2022-12-13 DIAGNOSIS — I2609 Other pulmonary embolism with acute cor pulmonale: Secondary | ICD-10-CM | POA: Diagnosis not present

## 2022-12-13 DIAGNOSIS — N320 Bladder-neck obstruction: Secondary | ICD-10-CM | POA: Diagnosis not present

## 2022-12-14 ENCOUNTER — Encounter: Payer: Self-pay | Admitting: Neurosurgery

## 2022-12-14 DIAGNOSIS — N320 Bladder-neck obstruction: Secondary | ICD-10-CM | POA: Diagnosis not present

## 2022-12-14 DIAGNOSIS — D62 Acute posthemorrhagic anemia: Secondary | ICD-10-CM | POA: Diagnosis not present

## 2022-12-14 DIAGNOSIS — I10 Essential (primary) hypertension: Secondary | ICD-10-CM | POA: Diagnosis not present

## 2022-12-14 DIAGNOSIS — R54 Age-related physical debility: Secondary | ICD-10-CM | POA: Diagnosis not present

## 2022-12-14 DIAGNOSIS — I82402 Acute embolism and thrombosis of unspecified deep veins of left lower extremity: Secondary | ICD-10-CM | POA: Diagnosis not present

## 2022-12-14 DIAGNOSIS — N390 Urinary tract infection, site not specified: Secondary | ICD-10-CM | POA: Diagnosis not present

## 2022-12-14 DIAGNOSIS — Z8546 Personal history of malignant neoplasm of prostate: Secondary | ICD-10-CM | POA: Diagnosis not present

## 2022-12-14 DIAGNOSIS — E871 Hypo-osmolality and hyponatremia: Secondary | ICD-10-CM | POA: Diagnosis not present

## 2022-12-14 DIAGNOSIS — E1142 Type 2 diabetes mellitus with diabetic polyneuropathy: Secondary | ICD-10-CM | POA: Diagnosis not present

## 2022-12-15 DIAGNOSIS — I82402 Acute embolism and thrombosis of unspecified deep veins of left lower extremity: Secondary | ICD-10-CM | POA: Diagnosis not present

## 2022-12-15 DIAGNOSIS — S22078D Other fracture of T9-T10 vertebra, subsequent encounter for fracture with routine healing: Secondary | ICD-10-CM | POA: Diagnosis not present

## 2022-12-15 DIAGNOSIS — I2609 Other pulmonary embolism with acute cor pulmonale: Secondary | ICD-10-CM | POA: Diagnosis not present

## 2022-12-15 DIAGNOSIS — N320 Bladder-neck obstruction: Secondary | ICD-10-CM | POA: Diagnosis not present

## 2022-12-15 DIAGNOSIS — D500 Iron deficiency anemia secondary to blood loss (chronic): Secondary | ICD-10-CM | POA: Diagnosis not present

## 2022-12-15 DIAGNOSIS — N390 Urinary tract infection, site not specified: Secondary | ICD-10-CM | POA: Diagnosis not present

## 2022-12-15 DIAGNOSIS — G4733 Obstructive sleep apnea (adult) (pediatric): Secondary | ICD-10-CM | POA: Diagnosis not present

## 2022-12-15 DIAGNOSIS — Z7984 Long term (current) use of oral hypoglycemic drugs: Secondary | ICD-10-CM | POA: Diagnosis not present

## 2022-12-15 DIAGNOSIS — I10 Essential (primary) hypertension: Secondary | ICD-10-CM | POA: Diagnosis not present

## 2022-12-16 ENCOUNTER — Ambulatory Visit: Payer: Medicare HMO | Admitting: Physician Assistant

## 2022-12-16 VITALS — BP 104/68 | HR 97 | Temp 97.6°F

## 2022-12-16 DIAGNOSIS — N320 Bladder-neck obstruction: Secondary | ICD-10-CM

## 2022-12-16 LAB — BLADDER SCAN AMB NON-IMAGING: Scan Result: 342

## 2022-12-16 NOTE — Progress Notes (Signed)
Catheter Removal  Patient is present today for a catheter removal.  10ml of water was drained from the balloon. A 18FR foley cath was removed from the bladder, no complications were noted. Patient tolerated well.  Performed by: Randa Lynn, RMA  Follow up/ Additional notes: this afternoon for a PM voiding trial

## 2022-12-16 NOTE — Progress Notes (Signed)
12/16/2022 3:29 PM   Alexander Wilcox 29-Jul-1950 [id number]  CC: Chief Complaint  Patient presents with   Follow-up    Voiding trial    HPI: Alexander Wilcox is a 72 y.o. male with PMH diabetes, prostate cancer, and a recent history of spinal fusion who underwent intraoperative bladder neck dilation with complicated Foley catheter placement due to bladder neck contracture as well as repeat admission with urosepsis and PEs who presents today for voiding trial.  Alexander Wilcox remains at [facility] and is accompanied today by his wife, who contributes to HPI.   Alexander Wilcox completed Cipro 500 mg twice daily x 7 days yesterday.  Foley catheter removed in the morning, see separate procedure note for details.  Alexander Wilcox returned to clinic in the afternoon.  Alexander Wilcox states Alexander Wilcox has voided multiple times and does not feel the urge to void at this time.  Due to mobility issues, Alexander Wilcox feels Alexander Wilcox had difficulty fully emptying his bladder in our bathroom in clinic and states that things are easier for him at his room at [facility].  PVR 342 mL.  PMH: Past Medical History:  Diagnosis Date   Arthritis    Diabetes mellitus without complication (HCC)    Hypertension    Prostate cancer (HCC)    Sleep apnea     Surgical History: Past Surgical History:  Procedure Laterality Date   APPENDECTOMY     BACK SURGERY     CYSTOSCOPY  11/12/2022   Procedure: Cystoscopy, dilation of bladder neck contracture, complex Foley catheter placement;  Surgeon: Sondra Come, MD;  Location: [facility] ORS;  Service: Urology;;   KNEE ARTHROPLASTY     PROSTATE SURGERY      Home Medications:  Allergies as of 12/16/2022       Reactions   Dilaudid [hydromorphone Hcl] Itching        Medication List        Accurate as of December 16, 2022  3:29 PM. If you have any questions, ask your nurse or doctor.          STOP taking these medications    acetaminophen 325 MG tablet Commonly known as: TYLENOL       TAKE these medications     apixaban 5 MG Tabs tablet Commonly known as: ELIQUIS Take 2 tablets (10 mg total) by mouth 2 (two) times daily. Then from 12/12/2022 take 5 mg twice a day   atorvastatin 40 MG tablet Commonly known as: LIPITOR Take 40 mg by mouth daily.   cyanocobalamin 1000 MCG tablet Take 1,000 mcg by mouth daily.   DULoxetine 30 MG capsule Commonly known as: CYMBALTA Take 1 capsule (30 mg total) by mouth daily.   fenofibrate 160 MG tablet TAKE 1 TABLET DAILY   gabapentin 800 MG tablet Commonly known as: NEURONTIN Take 1 tablet (800 mg total) by mouth 3 (three) times daily.   insulin glargine-yfgn 100 UNIT/ML injection Commonly known as: SEMGLEE Inject 0.15 mLs (15 Units total) into the skin at bedtime.   Lancets 30G Misc 1 each.   lisinopril 10 MG tablet Commonly known as: ZESTRIL Take 1 tablet (10 mg total) by mouth daily.   metFORMIN 500 MG 24 hr tablet Commonly known as: GLUCOPHAGE-XR Take 1 tablet by mouth 2 (two) times daily. Pt taking one in the morning and one at night   metoprolol succinate 25 MG 24 hr tablet Commonly known as: TOPROL-XL Take 25 mg by mouth 2 (two) times daily.   nortriptyline 50 MG  capsule Commonly known as: PAMELOR Take 50 mg by mouth at bedtime.   nystatin powder Commonly known as: MYCOSTATIN/NYSTOP Apply 1 Application topically 2 (two) times daily. Apply to groin to absorb moisture and prevent yeast infection.   Omeprazole 20 MG Tbdd Take 40 mg by mouth daily.   oxyCODONE HCl 7.5 MG Tabs Take 7.5 mg by mouth every 6 (six) hours as needed.   pregabalin 75 MG capsule Commonly known as: LYRICA Take 1 capsule (75 mg total) by mouth 2 (two) times daily.   tamsulosin 0.4 MG Caps capsule Commonly known as: FLOMAX Take 0.4 mg by mouth daily as needed (urinary retention).   UltiGuard SafePack Pen Needle 32G X 4 MM Misc Generic drug: Insulin Pen Needle Use 1 each once daily        Allergies:  Allergies  Allergen Reactions   Dilaudid  [Hydromorphone Hcl] Itching    Family History: Family History  Problem Relation Age of Onset   Diabetes Sister    Diabetes Maternal Grandmother    Lung disease Brother    Kidney failure Sister     Social History:   reports that Alexander Wilcox quit smoking about 26 years ago. His smoking use included cigarettes. Alexander Wilcox has a 60.00 pack-year smoking history. Alexander Wilcox has never used smokeless tobacco. Alexander Wilcox reports current alcohol use. Alexander Wilcox reports that Alexander Wilcox does not currently use drugs.  Physical Exam: BP 104/68   Pulse 97   Temp 97.6 F (36.4 C)   Constitutional:  Alert and oriented, no acute distress, nontoxic appearing HEENT: [name], AT Cardiovascular: No clubbing, cyanosis, or edema Respiratory: Normal respiratory effort, no increased work of breathing Skin: No rashes, bruises or suspicious lesions Neurologic: In wheelchair Psychiatric: Normal mood and affect  Laboratory Data: Results for orders placed or performed in visit on 12/16/22  BLADDER SCAN AMB NON-IMAGING  Result Value Ref Range   Scan Result 342 ml    Assessment & Plan:   1. Bladder neck contracture Bladder scan equivocal, however I suspect there may be a mobility component playing a role here in clinic.  We discussed options including Foley catheter replacement with repeat voiding trial later versus continued trial without catheter with repeat PVR tomorrow.  They elected for the latter.  Since Alexander Wilcox completed Cipro yesterday, I do not think there is any indication for further antibiotics to sterilize the urine today. - BLADDER SCAN AMB NON-IMAGING   Return in 1 day (on 12/17/2022) for Repeat PVR.  Carman Ching, PA-C  [facility] [place] [address] [place], [place] [address] [phone]

## 2022-12-17 ENCOUNTER — Ambulatory Visit: Payer: Medicare HMO | Admitting: Physician Assistant

## 2022-12-17 VITALS — BP 90/57 | HR 96 | Temp 97.9°F

## 2022-12-17 DIAGNOSIS — I2609 Other pulmonary embolism with acute cor pulmonale: Secondary | ICD-10-CM | POA: Diagnosis not present

## 2022-12-17 DIAGNOSIS — I10 Essential (primary) hypertension: Secondary | ICD-10-CM | POA: Diagnosis not present

## 2022-12-17 DIAGNOSIS — R32 Unspecified urinary incontinence: Secondary | ICD-10-CM | POA: Diagnosis not present

## 2022-12-17 DIAGNOSIS — I82402 Acute embolism and thrombosis of unspecified deep veins of left lower extremity: Secondary | ICD-10-CM | POA: Diagnosis not present

## 2022-12-17 DIAGNOSIS — N320 Bladder-neck obstruction: Secondary | ICD-10-CM | POA: Diagnosis not present

## 2022-12-17 DIAGNOSIS — Z794 Long term (current) use of insulin: Secondary | ICD-10-CM | POA: Diagnosis not present

## 2022-12-17 DIAGNOSIS — E1142 Type 2 diabetes mellitus with diabetic polyneuropathy: Secondary | ICD-10-CM | POA: Diagnosis not present

## 2022-12-17 DIAGNOSIS — Z7984 Long term (current) use of oral hypoglycemic drugs: Secondary | ICD-10-CM | POA: Diagnosis not present

## 2022-12-17 DIAGNOSIS — S22078D Other fracture of T9-T10 vertebra, subsequent encounter for fracture with routine healing: Secondary | ICD-10-CM | POA: Diagnosis not present

## 2022-12-17 LAB — BLADDER SCAN AMB NON-IMAGING: Scan Result: 59

## 2022-12-17 NOTE — Progress Notes (Signed)
12/17/2022 10:18 AM   Alexander Wilcox 1951-03-20 [id number]  CC: Chief Complaint  Patient presents with   Follow-up   HPI: Alexander Wilcox is a 72 y.o. male with PMH diabetes, prostate cancer, and bladder neck contracture requiring bladder neck dilation with complicated Foley placement at the time of spinal fusion with subsequent readmission with urosepsis and PEs who presents today for repeat PVR after an equivocal voiding trial yesterday.  He has been voiding overnight without difficulty and has no urinary concerns this morning.  PVR 59 mL.  PMH: Past Medical History:  Diagnosis Date   Arthritis    Diabetes mellitus without complication (HCC)    Hypertension    Prostate cancer (HCC)    Sleep apnea     Surgical History: Past Surgical History:  Procedure Laterality Date   APPENDECTOMY     BACK SURGERY     CYSTOSCOPY  11/12/2022   Procedure: Cystoscopy, dilation of bladder neck contracture, complex Foley catheter placement;  Surgeon: Sondra Come, MD;  Location: [facility] ORS;  Service: Urology;;   KNEE ARTHROPLASTY     PROSTATE SURGERY      Home Medications:  Allergies as of 12/17/2022       Reactions   Dilaudid [hydromorphone Hcl] Itching        Medication List        Accurate as of December 17, 2022 10:18 AM. If you have any questions, ask your nurse or doctor.          apixaban 5 MG Tabs tablet Commonly known as: ELIQUIS Take 2 tablets (10 mg total) by mouth 2 (two) times daily. Then from 12/12/2022 take 5 mg twice a day   atorvastatin 40 MG tablet Commonly known as: LIPITOR Take 40 mg by mouth daily.   cyanocobalamin 1000 MCG tablet Take 1,000 mcg by mouth daily.   DULoxetine 30 MG capsule Commonly known as: CYMBALTA Take 1 capsule (30 mg total) by mouth daily.   fenofibrate 160 MG tablet TAKE 1 TABLET DAILY   gabapentin 800 MG tablet Commonly known as: NEURONTIN Take 1 tablet (800 mg total) by mouth 3 (three) times daily.   insulin  glargine-yfgn 100 UNIT/ML injection Commonly known as: SEMGLEE Inject 0.15 mLs (15 Units total) into the skin at bedtime.   Lancets 30G Misc 1 each.   lisinopril 10 MG tablet Commonly known as: ZESTRIL Take 1 tablet (10 mg total) by mouth daily.   metFORMIN 500 MG 24 hr tablet Commonly known as: GLUCOPHAGE-XR Take 1 tablet by mouth 2 (two) times daily. Pt taking one in the morning and one at night   metoprolol succinate 25 MG 24 hr tablet Commonly known as: TOPROL-XL Take 25 mg by mouth 2 (two) times daily.   nortriptyline 50 MG capsule Commonly known as: PAMELOR Take 50 mg by mouth at bedtime.   nystatin powder Commonly known as: MYCOSTATIN/NYSTOP Apply 1 Application topically 2 (two) times daily. Apply to groin to absorb moisture and prevent yeast infection.   Omeprazole 20 MG Tbdd Take 40 mg by mouth daily.   oxyCODONE HCl 7.5 MG Tabs Take 7.5 mg by mouth every 6 (six) hours as needed.   pregabalin 75 MG capsule Commonly known as: LYRICA Take 1 capsule (75 mg total) by mouth 2 (two) times daily.   tamsulosin 0.4 MG Caps capsule Commonly known as: FLOMAX Take 0.4 mg by mouth daily as needed (urinary retention).   UltiGuard SafePack Pen Needle 32G X 4 MM Misc Generic  drug: Insulin Pen Needle Use 1 each once daily        Allergies:  Allergies  Allergen Reactions   Dilaudid [Hydromorphone Hcl] Itching    Family History: Family History  Problem Relation Age of Onset   Diabetes Sister    Diabetes Maternal Grandmother    Lung disease Brother    Kidney failure Sister     Social History:   reports that he quit smoking about 26 years ago. His smoking use included cigarettes. He has a 60.00 pack-year smoking history. He has never used smokeless tobacco. He reports current alcohol use. He reports that he does not currently use drugs.  Physical Exam: BP (!) 90/57   Pulse 96   Temp 97.9 F (36.6 C)   Constitutional:  Alert and oriented, no acute distress,  nontoxic appearing HEENT: [place], AT Cardiovascular: No clubbing, cyanosis, or edema Respiratory: Normal respiratory effort, no increased work of breathing Skin: No rashes, bruises or suspicious lesions Neurologic: Grossly intact, no focal deficits, moving all 4 extremities Psychiatric: Normal mood and affect  Laboratory Data: Results for orders placed or performed in visit on 12/17/22  BLADDER SCAN AMB NON-IMAGING  Result Value Ref Range   Scan Result 59 ml    Assessment & Plan:   1. Bladder neck contracture PVR WNL.  Will have him come back and see Dr. Lonna Cobb in 6 weeks for symptom recheck and repeat PVR in case of worsening/recurrence of his bladder neck contracture. - BLADDER SCAN AMB NON-IMAGING  Return in about 6 weeks (around 01/28/2023) for Symptom recheck with PVR.  Carman Ching, PA-C  [facility] U[facility] [place] [address] [place], [place] [address] [phone]

## 2022-12-20 ENCOUNTER — Telehealth: Payer: Self-pay | Admitting: Neurosurgery

## 2022-12-20 DIAGNOSIS — D500 Iron deficiency anemia secondary to blood loss (chronic): Secondary | ICD-10-CM | POA: Diagnosis not present

## 2022-12-20 DIAGNOSIS — I10 Essential (primary) hypertension: Secondary | ICD-10-CM | POA: Diagnosis not present

## 2022-12-20 DIAGNOSIS — E1142 Type 2 diabetes mellitus with diabetic polyneuropathy: Secondary | ICD-10-CM | POA: Diagnosis not present

## 2022-12-20 DIAGNOSIS — I2609 Other pulmonary embolism with acute cor pulmonale: Secondary | ICD-10-CM | POA: Diagnosis not present

## 2022-12-20 DIAGNOSIS — M25572 Pain in left ankle and joints of left foot: Secondary | ICD-10-CM

## 2022-12-20 DIAGNOSIS — Z7984 Long term (current) use of oral hypoglycemic drugs: Secondary | ICD-10-CM | POA: Diagnosis not present

## 2022-12-20 DIAGNOSIS — M25552 Pain in left hip: Secondary | ICD-10-CM

## 2022-12-20 DIAGNOSIS — S22078D Other fracture of T9-T10 vertebra, subsequent encounter for fracture with routine healing: Secondary | ICD-10-CM | POA: Diagnosis not present

## 2022-12-20 DIAGNOSIS — I82402 Acute embolism and thrombosis of unspecified deep veins of left lower extremity: Secondary | ICD-10-CM | POA: Diagnosis not present

## 2022-12-20 DIAGNOSIS — N320 Bladder-neck obstruction: Secondary | ICD-10-CM | POA: Diagnosis not present

## 2022-12-20 DIAGNOSIS — Z794 Long term (current) use of insulin: Secondary | ICD-10-CM | POA: Diagnosis not present

## 2022-12-20 DIAGNOSIS — M25562 Pain in left knee: Secondary | ICD-10-CM

## 2022-12-20 NOTE — Telephone Encounter (Signed)
thoracic fusion on 11/12/22 Patient is having pain in his left hip down into his foot. He has swelling in his left ankle and foot. Since this is due to his accident for which he had to have surgery too; can Dr.Yarbrough order xrays so he can have it done with his post-op xrays? He is at [facility] and it is very difficult for the patient to get [facility] to provide transportation. So it would help a lot if he could get xrays of his left leg that same day. If needed Dr.Yarbrough can refer him to ortho.

## 2022-12-21 NOTE — Telephone Encounter (Signed)
Ordered a left hip, knee, and ankle xray per discussion with Dr Myer Haff.

## 2022-12-22 ENCOUNTER — Other Ambulatory Visit: Payer: Self-pay

## 2022-12-22 DIAGNOSIS — S22079S Unspecified fracture of T9-T10 vertebra, sequela: Secondary | ICD-10-CM

## 2022-12-22 NOTE — Telephone Encounter (Signed)
[name] is aware that Dr.Yarbrough placed referral for left leg xrays.

## 2022-12-23 ENCOUNTER — Encounter: Payer: Self-pay | Admitting: Neurosurgery

## 2022-12-23 ENCOUNTER — Ambulatory Visit
Admission: RE | Admit: 2022-12-23 | Discharge: 2022-12-23 | Disposition: A | Discharge status: Home / Self Care | Payer: Medicare HMO | Source: Ambulatory Visit | Attending: Neurosurgery | Admitting: Neurosurgery

## 2022-12-23 ENCOUNTER — Ambulatory Visit
Admission: RE | Admit: 2022-12-23 | Discharge: 2022-12-23 | Disposition: A | Discharge status: Home / Self Care | Payer: Medicare HMO | Attending: Neurosurgery | Admitting: Neurosurgery

## 2022-12-23 ENCOUNTER — Ambulatory Visit (INDEPENDENT_AMBULATORY_CARE_PROVIDER_SITE_OTHER): Payer: Medicare HMO | Admitting: Neurosurgery

## 2022-12-23 VITALS — BP 110/60 | Temp 98.5°F | Wt 255.0 lb

## 2022-12-23 DIAGNOSIS — M1612 Unilateral primary osteoarthritis, left hip: Secondary | ICD-10-CM | POA: Diagnosis not present

## 2022-12-23 DIAGNOSIS — S22079D Unspecified fracture of T9-T10 vertebra, subsequent encounter for fracture with routine healing: Secondary | ICD-10-CM

## 2022-12-23 DIAGNOSIS — M1712 Unilateral primary osteoarthritis, left knee: Secondary | ICD-10-CM | POA: Diagnosis not present

## 2022-12-23 DIAGNOSIS — W19XXXD Unspecified fall, subsequent encounter: Secondary | ICD-10-CM

## 2022-12-23 DIAGNOSIS — M532X4 Spinal instabilities, thoracic region: Secondary | ICD-10-CM

## 2022-12-23 DIAGNOSIS — M25572 Pain in left ankle and joints of left foot: Secondary | ICD-10-CM | POA: Insufficient documentation

## 2022-12-23 DIAGNOSIS — M25562 Pain in left knee: Secondary | ICD-10-CM

## 2022-12-23 DIAGNOSIS — Z981 Arthrodesis status: Secondary | ICD-10-CM | POA: Diagnosis not present

## 2022-12-23 DIAGNOSIS — S22079S Unspecified fracture of T9-T10 vertebra, sequela: Secondary | ICD-10-CM | POA: Insufficient documentation

## 2022-12-23 DIAGNOSIS — M4815 Ankylosing hyperostosis [Forestier], thoracolumbar region: Secondary | ICD-10-CM

## 2022-12-23 DIAGNOSIS — M25552 Pain in left hip: Secondary | ICD-10-CM | POA: Diagnosis not present

## 2022-12-23 DIAGNOSIS — R600 Localized edema: Secondary | ICD-10-CM | POA: Diagnosis not present

## 2022-12-23 DIAGNOSIS — Z09 Encounter for follow-up examination after completed treatment for conditions other than malignant neoplasm: Secondary | ICD-10-CM

## 2022-12-23 NOTE — Progress Notes (Signed)
   REFERRING PHYSICIAN:  No referring provider defined for this encounter.  DOS: 11/12/22  Open reduction internal fixation of T9 fracture  HISTORY OF PRESENT ILLNESS: Alexander Wilcox is status post ORIF of T9 fracture.   He is still in a facility.  Since I last saw him, he was admitted for a PE and for urinary tract infection at the same time.  He no longer has a Foley catheter.  He is urinating but without control.  He is having some pain in his left groin.  He is now walking short distances with a walker.  PHYSICAL EXAMINATION:  General: Patient is well developed, well nourished, calm, collected, and in no apparent distress.   NEUROLOGICAL:  General: In no acute distress.   Awake, alert, oriented to person, place, and time.  Pupils equal round and reactive to light.  Facial tone is symmetric.     Strength:            Side Iliopsoas Quads Hamstring PF DF EHL  R 5 5 5 5 5 5   L 3 4- 4 4 4 3    Incision c/d/i   ROS (Neurologic):  Negative except as noted above  IMAGING: No complications noted on spine imaging.  I reviewed his hip, knee, and ankle imaging.  I will defer further management on this to his orthopedic providers.  ASSESSMENT/PLAN:  Alexander Wilcox is doing reasonably well s/p above surgery.  He should be discharged back home next week.  He will start home health PT.  We discussed referral to the inpatient rehab physicians as an outpatient.  He would like to defer for now.  I will contact his urologist and orthopedic provider to make sure he gets ongoing care with them.    I think he will continue to improve for up to a year.     Venetia Night MD Department of neurosurgery

## 2022-12-24 DIAGNOSIS — I82402 Acute embolism and thrombosis of unspecified deep veins of left lower extremity: Secondary | ICD-10-CM | POA: Diagnosis not present

## 2022-12-24 DIAGNOSIS — M79672 Pain in left foot: Secondary | ICD-10-CM | POA: Diagnosis not present

## 2022-12-24 DIAGNOSIS — S22078D Other fracture of T9-T10 vertebra, subsequent encounter for fracture with routine healing: Secondary | ICD-10-CM | POA: Diagnosis not present

## 2022-12-24 DIAGNOSIS — F32A Depression, unspecified: Secondary | ICD-10-CM | POA: Diagnosis not present

## 2022-12-24 DIAGNOSIS — I2609 Other pulmonary embolism with acute cor pulmonale: Secondary | ICD-10-CM | POA: Diagnosis not present

## 2022-12-24 DIAGNOSIS — I2510 Atherosclerotic heart disease of native coronary artery without angina pectoris: Secondary | ICD-10-CM | POA: Diagnosis not present

## 2022-12-24 DIAGNOSIS — I10 Essential (primary) hypertension: Secondary | ICD-10-CM | POA: Diagnosis not present

## 2022-12-24 DIAGNOSIS — N320 Bladder-neck obstruction: Secondary | ICD-10-CM | POA: Diagnosis not present

## 2022-12-29 ENCOUNTER — Encounter: Payer: Self-pay | Admitting: Neurosurgery

## 2022-12-29 DIAGNOSIS — M79605 Pain in left leg: Secondary | ICD-10-CM | POA: Diagnosis not present

## 2022-12-29 DIAGNOSIS — S22079S Unspecified fracture of T9-T10 vertebra, sequela: Secondary | ICD-10-CM | POA: Diagnosis not present

## 2022-12-29 DIAGNOSIS — M1612 Unilateral primary osteoarthritis, left hip: Secondary | ICD-10-CM | POA: Diagnosis not present

## 2022-12-29 DIAGNOSIS — M19172 Post-traumatic osteoarthritis, left ankle and foot: Secondary | ICD-10-CM | POA: Diagnosis not present

## 2022-12-29 DIAGNOSIS — M25572 Pain in left ankle and joints of left foot: Secondary | ICD-10-CM | POA: Diagnosis not present

## 2022-12-29 DIAGNOSIS — M1712 Unilateral primary osteoarthritis, left knee: Secondary | ICD-10-CM | POA: Diagnosis not present

## 2022-12-30 NOTE — Telephone Encounter (Signed)
I spoke with Alexander Wilcox. He reports that last night he experienced tingling and jumping in his legs. I explained that this is likely spasms and offered to send in baclofen for him to take when he gets home tomorrow. He declined at this time and said he will contact us if they continue and he decides he wants the prescription.

## 2022-12-31 DIAGNOSIS — F32A Depression, unspecified: Secondary | ICD-10-CM | POA: Diagnosis not present

## 2022-12-31 DIAGNOSIS — I2510 Atherosclerotic heart disease of native coronary artery without angina pectoris: Secondary | ICD-10-CM | POA: Diagnosis not present

## 2022-12-31 DIAGNOSIS — I2609 Other pulmonary embolism with acute cor pulmonale: Secondary | ICD-10-CM | POA: Diagnosis not present

## 2022-12-31 DIAGNOSIS — N320 Bladder-neck obstruction: Secondary | ICD-10-CM | POA: Diagnosis not present

## 2022-12-31 DIAGNOSIS — S22078D Other fracture of T9-T10 vertebra, subsequent encounter for fracture with routine healing: Secondary | ICD-10-CM | POA: Diagnosis not present

## 2022-12-31 DIAGNOSIS — I82402 Acute embolism and thrombosis of unspecified deep veins of left lower extremity: Secondary | ICD-10-CM | POA: Diagnosis not present

## 2022-12-31 DIAGNOSIS — M79672 Pain in left foot: Secondary | ICD-10-CM | POA: Diagnosis not present

## 2022-12-31 DIAGNOSIS — Z7984 Long term (current) use of oral hypoglycemic drugs: Secondary | ICD-10-CM | POA: Diagnosis not present

## 2022-12-31 DIAGNOSIS — I10 Essential (primary) hypertension: Secondary | ICD-10-CM | POA: Diagnosis not present

## 2023-01-01 DIAGNOSIS — W19XXXD Unspecified fall, subsequent encounter: Secondary | ICD-10-CM | POA: Diagnosis not present

## 2023-01-01 DIAGNOSIS — B9620 Unspecified Escherichia coli [E. coli] as the cause of diseases classified elsewhere: Secondary | ICD-10-CM | POA: Diagnosis not present

## 2023-01-01 DIAGNOSIS — I82402 Acute embolism and thrombosis of unspecified deep veins of left lower extremity: Secondary | ICD-10-CM | POA: Diagnosis not present

## 2023-01-01 DIAGNOSIS — N320 Bladder-neck obstruction: Secondary | ICD-10-CM | POA: Diagnosis not present

## 2023-01-01 DIAGNOSIS — I2609 Other pulmonary embolism with acute cor pulmonale: Secondary | ICD-10-CM | POA: Diagnosis not present

## 2023-01-01 DIAGNOSIS — S22078D Other fracture of T9-T10 vertebra, subsequent encounter for fracture with routine healing: Secondary | ICD-10-CM | POA: Diagnosis not present

## 2023-01-01 DIAGNOSIS — A419 Sepsis, unspecified organism: Secondary | ICD-10-CM | POA: Diagnosis not present

## 2023-01-01 DIAGNOSIS — N390 Urinary tract infection, site not specified: Secondary | ICD-10-CM | POA: Diagnosis not present

## 2023-01-01 DIAGNOSIS — T83511D Infection and inflammatory reaction due to indwelling urethral catheter, subsequent encounter: Secondary | ICD-10-CM | POA: Diagnosis not present

## 2023-01-03 DIAGNOSIS — E782 Mixed hyperlipidemia: Secondary | ICD-10-CM | POA: Diagnosis not present

## 2023-01-03 DIAGNOSIS — I10 Essential (primary) hypertension: Secondary | ICD-10-CM | POA: Diagnosis not present

## 2023-01-03 DIAGNOSIS — Z86711 Personal history of pulmonary embolism: Secondary | ICD-10-CM | POA: Diagnosis not present

## 2023-01-03 DIAGNOSIS — Z86718 Personal history of other venous thrombosis and embolism: Secondary | ICD-10-CM | POA: Diagnosis not present

## 2023-01-03 DIAGNOSIS — Z862 Personal history of diseases of the blood and blood-forming organs and certain disorders involving the immune mechanism: Secondary | ICD-10-CM | POA: Diagnosis not present

## 2023-01-04 DIAGNOSIS — A419 Sepsis, unspecified organism: Secondary | ICD-10-CM | POA: Diagnosis not present

## 2023-01-04 DIAGNOSIS — W19XXXD Unspecified fall, subsequent encounter: Secondary | ICD-10-CM | POA: Diagnosis not present

## 2023-01-04 DIAGNOSIS — S22078D Other fracture of T9-T10 vertebra, subsequent encounter for fracture with routine healing: Secondary | ICD-10-CM | POA: Diagnosis not present

## 2023-01-04 DIAGNOSIS — I82402 Acute embolism and thrombosis of unspecified deep veins of left lower extremity: Secondary | ICD-10-CM | POA: Diagnosis not present

## 2023-01-04 DIAGNOSIS — N390 Urinary tract infection, site not specified: Secondary | ICD-10-CM | POA: Diagnosis not present

## 2023-01-04 DIAGNOSIS — N320 Bladder-neck obstruction: Secondary | ICD-10-CM | POA: Diagnosis not present

## 2023-01-04 DIAGNOSIS — B9620 Unspecified Escherichia coli [E. coli] as the cause of diseases classified elsewhere: Secondary | ICD-10-CM | POA: Diagnosis not present

## 2023-01-04 DIAGNOSIS — I2609 Other pulmonary embolism with acute cor pulmonale: Secondary | ICD-10-CM | POA: Diagnosis not present

## 2023-01-04 DIAGNOSIS — T83511D Infection and inflammatory reaction due to indwelling urethral catheter, subsequent encounter: Secondary | ICD-10-CM | POA: Diagnosis not present

## 2023-01-10 DIAGNOSIS — Z862 Personal history of diseases of the blood and blood-forming organs and certain disorders involving the immune mechanism: Secondary | ICD-10-CM | POA: Diagnosis not present

## 2023-01-10 DIAGNOSIS — E782 Mixed hyperlipidemia: Secondary | ICD-10-CM | POA: Diagnosis not present

## 2023-01-10 DIAGNOSIS — I10 Essential (primary) hypertension: Secondary | ICD-10-CM | POA: Diagnosis not present

## 2023-01-11 DIAGNOSIS — N320 Bladder-neck obstruction: Secondary | ICD-10-CM | POA: Diagnosis not present

## 2023-01-11 DIAGNOSIS — S22078D Other fracture of T9-T10 vertebra, subsequent encounter for fracture with routine healing: Secondary | ICD-10-CM | POA: Diagnosis not present

## 2023-01-11 DIAGNOSIS — B9620 Unspecified Escherichia coli [E. coli] as the cause of diseases classified elsewhere: Secondary | ICD-10-CM | POA: Diagnosis not present

## 2023-01-11 DIAGNOSIS — I2609 Other pulmonary embolism with acute cor pulmonale: Secondary | ICD-10-CM | POA: Diagnosis not present

## 2023-01-11 DIAGNOSIS — N390 Urinary tract infection, site not specified: Secondary | ICD-10-CM | POA: Diagnosis not present

## 2023-01-11 DIAGNOSIS — T83511D Infection and inflammatory reaction due to indwelling urethral catheter, subsequent encounter: Secondary | ICD-10-CM | POA: Diagnosis not present

## 2023-01-11 DIAGNOSIS — I82402 Acute embolism and thrombosis of unspecified deep veins of left lower extremity: Secondary | ICD-10-CM | POA: Diagnosis not present

## 2023-01-11 DIAGNOSIS — W19XXXD Unspecified fall, subsequent encounter: Secondary | ICD-10-CM | POA: Diagnosis not present

## 2023-01-11 DIAGNOSIS — A419 Sepsis, unspecified organism: Secondary | ICD-10-CM | POA: Diagnosis not present

## 2023-01-14 DIAGNOSIS — B9620 Unspecified Escherichia coli [E. coli] as the cause of diseases classified elsewhere: Secondary | ICD-10-CM | POA: Diagnosis not present

## 2023-01-14 DIAGNOSIS — N390 Urinary tract infection, site not specified: Secondary | ICD-10-CM | POA: Diagnosis not present

## 2023-01-14 DIAGNOSIS — I82402 Acute embolism and thrombosis of unspecified deep veins of left lower extremity: Secondary | ICD-10-CM | POA: Diagnosis not present

## 2023-01-14 DIAGNOSIS — N320 Bladder-neck obstruction: Secondary | ICD-10-CM | POA: Diagnosis not present

## 2023-01-14 DIAGNOSIS — T83511D Infection and inflammatory reaction due to indwelling urethral catheter, subsequent encounter: Secondary | ICD-10-CM | POA: Diagnosis not present

## 2023-01-14 DIAGNOSIS — S22078D Other fracture of T9-T10 vertebra, subsequent encounter for fracture with routine healing: Secondary | ICD-10-CM | POA: Diagnosis not present

## 2023-01-14 DIAGNOSIS — I2609 Other pulmonary embolism with acute cor pulmonale: Secondary | ICD-10-CM | POA: Diagnosis not present

## 2023-01-14 DIAGNOSIS — W19XXXD Unspecified fall, subsequent encounter: Secondary | ICD-10-CM | POA: Diagnosis not present

## 2023-01-14 DIAGNOSIS — A419 Sepsis, unspecified organism: Secondary | ICD-10-CM | POA: Diagnosis not present

## 2023-01-17 DIAGNOSIS — N320 Bladder-neck obstruction: Secondary | ICD-10-CM | POA: Diagnosis not present

## 2023-01-17 DIAGNOSIS — B9620 Unspecified Escherichia coli [E. coli] as the cause of diseases classified elsewhere: Secondary | ICD-10-CM | POA: Diagnosis not present

## 2023-01-17 DIAGNOSIS — T83511D Infection and inflammatory reaction due to indwelling urethral catheter, subsequent encounter: Secondary | ICD-10-CM | POA: Diagnosis not present

## 2023-01-17 DIAGNOSIS — A419 Sepsis, unspecified organism: Secondary | ICD-10-CM | POA: Diagnosis not present

## 2023-01-17 DIAGNOSIS — S22078D Other fracture of T9-T10 vertebra, subsequent encounter for fracture with routine healing: Secondary | ICD-10-CM | POA: Diagnosis not present

## 2023-01-17 DIAGNOSIS — I2609 Other pulmonary embolism with acute cor pulmonale: Secondary | ICD-10-CM | POA: Diagnosis not present

## 2023-01-17 DIAGNOSIS — I82402 Acute embolism and thrombosis of unspecified deep veins of left lower extremity: Secondary | ICD-10-CM | POA: Diagnosis not present

## 2023-01-17 DIAGNOSIS — N390 Urinary tract infection, site not specified: Secondary | ICD-10-CM | POA: Diagnosis not present

## 2023-01-17 DIAGNOSIS — W19XXXD Unspecified fall, subsequent encounter: Secondary | ICD-10-CM | POA: Diagnosis not present

## 2023-01-19 DIAGNOSIS — R319 Hematuria, unspecified: Secondary | ICD-10-CM | POA: Diagnosis not present

## 2023-01-19 DIAGNOSIS — W19XXXD Unspecified fall, subsequent encounter: Secondary | ICD-10-CM | POA: Diagnosis not present

## 2023-01-19 DIAGNOSIS — I82402 Acute embolism and thrombosis of unspecified deep veins of left lower extremity: Secondary | ICD-10-CM | POA: Diagnosis not present

## 2023-01-19 DIAGNOSIS — T83511D Infection and inflammatory reaction due to indwelling urethral catheter, subsequent encounter: Secondary | ICD-10-CM | POA: Diagnosis not present

## 2023-01-19 DIAGNOSIS — I2609 Other pulmonary embolism with acute cor pulmonale: Secondary | ICD-10-CM | POA: Diagnosis not present

## 2023-01-19 DIAGNOSIS — S22078D Other fracture of T9-T10 vertebra, subsequent encounter for fracture with routine healing: Secondary | ICD-10-CM | POA: Diagnosis not present

## 2023-01-19 DIAGNOSIS — N320 Bladder-neck obstruction: Secondary | ICD-10-CM | POA: Diagnosis not present

## 2023-01-19 DIAGNOSIS — B9620 Unspecified Escherichia coli [E. coli] as the cause of diseases classified elsewhere: Secondary | ICD-10-CM | POA: Diagnosis not present

## 2023-01-19 DIAGNOSIS — A419 Sepsis, unspecified organism: Secondary | ICD-10-CM | POA: Diagnosis not present

## 2023-01-19 DIAGNOSIS — N390 Urinary tract infection, site not specified: Secondary | ICD-10-CM | POA: Diagnosis not present

## 2023-01-20 DIAGNOSIS — E1169 Type 2 diabetes mellitus with other specified complication: Secondary | ICD-10-CM | POA: Diagnosis not present

## 2023-01-20 DIAGNOSIS — I152 Hypertension secondary to endocrine disorders: Secondary | ICD-10-CM | POA: Diagnosis not present

## 2023-01-20 DIAGNOSIS — E1165 Type 2 diabetes mellitus with hyperglycemia: Secondary | ICD-10-CM | POA: Diagnosis not present

## 2023-01-20 DIAGNOSIS — E785 Hyperlipidemia, unspecified: Secondary | ICD-10-CM | POA: Diagnosis not present

## 2023-01-20 DIAGNOSIS — E1159 Type 2 diabetes mellitus with other circulatory complications: Secondary | ICD-10-CM | POA: Diagnosis not present

## 2023-01-24 DIAGNOSIS — E785 Hyperlipidemia, unspecified: Secondary | ICD-10-CM | POA: Diagnosis not present

## 2023-01-24 DIAGNOSIS — R29898 Other symptoms and signs involving the musculoskeletal system: Secondary | ICD-10-CM | POA: Diagnosis not present

## 2023-01-24 DIAGNOSIS — N3000 Acute cystitis without hematuria: Secondary | ICD-10-CM | POA: Diagnosis not present

## 2023-01-24 DIAGNOSIS — M1712 Unilateral primary osteoarthritis, left knee: Secondary | ICD-10-CM | POA: Diagnosis not present

## 2023-01-24 DIAGNOSIS — S22079S Unspecified fracture of T9-T10 vertebra, sequela: Secondary | ICD-10-CM | POA: Diagnosis not present

## 2023-01-24 DIAGNOSIS — M1612 Unilateral primary osteoarthritis, left hip: Secondary | ICD-10-CM | POA: Diagnosis not present

## 2023-01-24 DIAGNOSIS — M19172 Post-traumatic osteoarthritis, left ankle and foot: Secondary | ICD-10-CM | POA: Diagnosis not present

## 2023-01-24 DIAGNOSIS — W010XXD Fall on same level from slipping, tripping and stumbling without subsequent striking against object, subsequent encounter: Secondary | ICD-10-CM | POA: Diagnosis not present

## 2023-01-24 DIAGNOSIS — M79605 Pain in left leg: Secondary | ICD-10-CM | POA: Diagnosis not present

## 2023-01-24 DIAGNOSIS — E1169 Type 2 diabetes mellitus with other specified complication: Secondary | ICD-10-CM | POA: Diagnosis not present

## 2023-01-25 DIAGNOSIS — T83511D Infection and inflammatory reaction due to indwelling urethral catheter, subsequent encounter: Secondary | ICD-10-CM | POA: Diagnosis not present

## 2023-01-25 DIAGNOSIS — S22078D Other fracture of T9-T10 vertebra, subsequent encounter for fracture with routine healing: Secondary | ICD-10-CM | POA: Diagnosis not present

## 2023-01-25 DIAGNOSIS — N320 Bladder-neck obstruction: Secondary | ICD-10-CM | POA: Diagnosis not present

## 2023-01-25 DIAGNOSIS — I2609 Other pulmonary embolism with acute cor pulmonale: Secondary | ICD-10-CM | POA: Diagnosis not present

## 2023-01-25 DIAGNOSIS — N390 Urinary tract infection, site not specified: Secondary | ICD-10-CM | POA: Diagnosis not present

## 2023-01-25 DIAGNOSIS — W19XXXD Unspecified fall, subsequent encounter: Secondary | ICD-10-CM | POA: Diagnosis not present

## 2023-01-25 DIAGNOSIS — A419 Sepsis, unspecified organism: Secondary | ICD-10-CM | POA: Diagnosis not present

## 2023-01-25 DIAGNOSIS — B9620 Unspecified Escherichia coli [E. coli] as the cause of diseases classified elsewhere: Secondary | ICD-10-CM | POA: Diagnosis not present

## 2023-01-25 DIAGNOSIS — I82402 Acute embolism and thrombosis of unspecified deep veins of left lower extremity: Secondary | ICD-10-CM | POA: Diagnosis not present

## 2023-01-26 DIAGNOSIS — T83511D Infection and inflammatory reaction due to indwelling urethral catheter, subsequent encounter: Secondary | ICD-10-CM | POA: Diagnosis not present

## 2023-01-26 DIAGNOSIS — I2609 Other pulmonary embolism with acute cor pulmonale: Secondary | ICD-10-CM | POA: Diagnosis not present

## 2023-01-26 DIAGNOSIS — B9620 Unspecified Escherichia coli [E. coli] as the cause of diseases classified elsewhere: Secondary | ICD-10-CM | POA: Diagnosis not present

## 2023-01-26 DIAGNOSIS — N320 Bladder-neck obstruction: Secondary | ICD-10-CM | POA: Diagnosis not present

## 2023-01-26 DIAGNOSIS — I82402 Acute embolism and thrombosis of unspecified deep veins of left lower extremity: Secondary | ICD-10-CM | POA: Diagnosis not present

## 2023-01-26 DIAGNOSIS — A419 Sepsis, unspecified organism: Secondary | ICD-10-CM | POA: Diagnosis not present

## 2023-01-26 DIAGNOSIS — N390 Urinary tract infection, site not specified: Secondary | ICD-10-CM | POA: Diagnosis not present

## 2023-01-26 DIAGNOSIS — S22078D Other fracture of T9-T10 vertebra, subsequent encounter for fracture with routine healing: Secondary | ICD-10-CM | POA: Diagnosis not present

## 2023-01-26 DIAGNOSIS — W19XXXD Unspecified fall, subsequent encounter: Secondary | ICD-10-CM | POA: Diagnosis not present

## 2023-01-28 ENCOUNTER — Other Ambulatory Visit: Payer: Self-pay | Admitting: Orthopedic Surgery

## 2023-01-28 ENCOUNTER — Encounter: Payer: Self-pay | Admitting: Urology

## 2023-01-28 ENCOUNTER — Ambulatory Visit (INDEPENDENT_AMBULATORY_CARE_PROVIDER_SITE_OTHER): Payer: Medicare HMO | Admitting: Urology

## 2023-01-28 VITALS — BP 113/70 | HR 81 | Ht 69.0 in | Wt 230.0 lb

## 2023-01-28 DIAGNOSIS — N320 Bladder-neck obstruction: Secondary | ICD-10-CM | POA: Diagnosis not present

## 2023-01-28 DIAGNOSIS — N390 Urinary tract infection, site not specified: Secondary | ICD-10-CM

## 2023-01-28 DIAGNOSIS — Z981 Arthrodesis status: Secondary | ICD-10-CM

## 2023-01-28 DIAGNOSIS — S22079S Unspecified fracture of T9-T10 vertebra, sequela: Secondary | ICD-10-CM

## 2023-01-28 DIAGNOSIS — Z8546 Personal history of malignant neoplasm of prostate: Secondary | ICD-10-CM

## 2023-01-28 DIAGNOSIS — C61 Malignant neoplasm of prostate: Secondary | ICD-10-CM

## 2023-01-28 NOTE — H&P (View-Only) (Signed)
I, Alexander Wilcox, acting as a scribe for Alexander Altes, MD., have documented all relevant documentation on the behalf of Alexander Altes, MD, as directed by Alexander Altes, MD while in the presence of Alexander Altes, MD.  01/28/2023 11:29 AM   Alexander Wilcox 05-14-1951 [id number]  Referring provider: Marisue Ivan, MD [phone] [facility] [address] [facility] [place],  [place] [address]  Chief Complaint  Patient presents with   Follow-up    6 week follow-up   Urologic history: Bladder neck contracture   HPI: [name] is a 72 y.o. male presents for 6 week follow-up visit.   Referred to Sam Vaillancourt's previous note 12/17/22.  Dilation of a bladder neck contracture when a foley catheter was unable to be placed prior to a neurosurgery procedure. His primary prostate cancer treatment was HIFU x2 and subsequent radiation.  After catheter was removed, he has had chronic urinary incontinence and is unable to generate a urinary stream. Recently diagnosed with a staph epi UTI and has significant dysuria and burning  He was initially treated with Macrobid and had no symptom improvement and started Zyvox yesterday.    PMH: Past Medical History:  Diagnosis Date   Arthritis    Diabetes mellitus without complication (HCC)    Hypertension    Prostate cancer (HCC)    Sleep apnea     Surgical History: Past Surgical History:  Procedure Laterality Date   APPENDECTOMY     BACK SURGERY     CYSTOSCOPY  11/12/2022   Procedure: Cystoscopy, dilation of bladder neck contracture, complex Foley catheter placement;  Surgeon: Sondra Come, MD;  Location: [facility] ORS;  Service: Urology;;   KNEE ARTHROPLASTY     PROSTATE SURGERY      Home Medications:  Allergies as of 01/28/2023       Reactions   Dilaudid [hydromorphone Hcl] Itching        Medication List        Accurate as of January 28, 2023 11:29 AM. If you have any questions, ask your nurse or doctor.           STOP taking these medications    docusate sodium 100 MG capsule Commonly known as: COLACE Stopped by: Alexander Wilcox   DULoxetine 30 MG capsule Commonly known as: CYMBALTA Stopped by: Alexander Wilcox   gabapentin 800 MG tablet Commonly known as: NEURONTIN Stopped by: Alexander Wilcox   nortriptyline 50 MG capsule Commonly known as: PAMELOR Stopped by: Alexander Wilcox   oxyCODONE 15 MG immediate release tablet Commonly known as: ROXICODONE Stopped by: Alexander Wilcox   polyethylene glycol 17 g packet Commonly known as: MIRALAX / GLYCOLAX Stopped by: Alexander Wilcox   pregabalin 75 MG capsule Commonly known as: LYRICA Stopped by: Alexander Wilcox   tamsulosin 0.4 MG Caps capsule Commonly known as: FLOMAX Stopped by: Alexander Wilcox       TAKE these medications    acetaminophen 325 MG tablet Commonly known as: TYLENOL Take 650 mg by mouth every 6 (six) hours as needed.   apixaban 5 MG Tabs tablet Commonly known as: ELIQUIS Take 2 tablets (10 mg total) by mouth 2 (two) times daily. Then from 12/12/2022 take 5 mg twice a day   atorvastatin 40 MG tablet Commonly known as: LIPITOR Take 40 mg by mouth daily.   cyanocobalamin 1000 MCG tablet Take 1,000 mcg by mouth daily.   fenofibrate 160 MG tablet  TAKE 1 TABLET DAILY   ferrous sulfate 325 (65 FE) MG tablet Take 325 mg by mouth daily.   insulin glargine-yfgn 100 UNIT/ML injection Commonly known as: SEMGLEE Inject 0.15 mLs (15 Units total) into the skin at bedtime.   Lancets 30G Misc 1 each.   linezolid 600 MG tablet Commonly known as: ZYVOX Take 600 mg by mouth 2 (two) times daily.   lisinopril 10 MG tablet Commonly known as: ZESTRIL Take 1 tablet (10 mg total) by mouth daily.   metFORMIN 500 MG 24 hr tablet Commonly known as: GLUCOPHAGE-XR Take 1 tablet by mouth 2 (two) times daily. Pt taking one in the morning and one at night   metoprolol succinate 25 MG 24 hr tablet Commonly known as:  TOPROL-XL Take 25 mg by mouth daily.   nystatin powder Commonly known as: MYCOSTATIN/NYSTOP Apply 1 Application topically 2 (two) times daily. Apply to groin to absorb moisture and prevent yeast infection.   omeprazole 40 MG capsule Commonly known as: PRILOSEC Take 40 mg by mouth daily. What changed: Another medication with the same name was removed. Continue taking this medication, and follow the directions you see here. Changed by: Alexander Wilcox   UltiGuard SafePack Pen Needle 32G X 4 MM Misc Generic drug: Insulin Pen Needle Use 1 each once daily        Allergies:  Allergies  Allergen Reactions   Dilaudid [Hydromorphone Hcl] Itching    Family History: Family History  Problem Relation Age of Onset   Diabetes Sister    Diabetes Maternal Grandmother    Lung disease Brother    Kidney failure Sister     Social History:  reports that he quit smoking about 26 years ago. His smoking use included cigarettes. He started smoking about 56 years ago. He has a 60 pack-year smoking history. He has been exposed to tobacco smoke. He has never used smokeless tobacco. He reports current alcohol use. He reports that he does not currently use drugs.   Physical Exam: BP 113/70   Pulse 81   Ht 5\' 9"  (1.753 m)   Wt 230 lb (104.3 kg)   BMI 33.97 kg/m   Constitutional:  Alert and oriented, No acute distress. HEENT: [place] AT Respiratory: Normal respiratory effort, no increased work of breathing. Psychiatric: Normal mood and affect.   Assessment & Plan:    1. Bladder neck contracture Status post dilation with chronic incontinence after catheter removal, suspicious for sphincteric incontinence. PVR today 0 mL If incontinence does not improve, we'll get an opinion with Dr. Sherron Monday.  2. Urinary tract infection Urine culture grew staph epi and switched to Zyvox yesterday  He was instructed to call if symptoms have not improved by early next week and may need ID consult  He has a  October 2024 appointment for PSA  I have reviewed the above documentation for accuracy and completeness, and I agree with the above.   Alexander Altes, MD  [facility] [address], [address] [place], [place] [address] [phone]

## 2023-01-28 NOTE — Progress Notes (Signed)
I, Alexander Wilcox, acting as a scribe for Alexander Altes, MD., have documented all relevant documentation on the behalf of Alexander Altes, MD, as directed by Alexander Altes, MD while in the presence of Alexander Altes, MD.  01/28/2023 11:29 AM   Alexander Wilcox [birth year] [id number]  Referring provider: Marisue Ivan, MD [phone] [facility] [address] [facility] [place],  [place] [address]  Chief Complaint  Patient presents with   Follow-up    6 week follow-up   Urologic history: Bladder neck contracture   HPI: Alexander Wilcox is a 72 y.o. male presents for 6 week follow-up visit.   Referred to Alexander Wilcox's previous note 12/17/22.  He states he had bladder neck contracture dilated when a foley catheter was unable to be placed prior to a neurosurgery procedure and was found to have a dense bladder neck contracture. His primary prostate cancer treatment was HIFU x2 and subsequent radiation.  After catheter was removed, he has had chronic urinary incontinence and is unable to generate a urinary stream. Recently diagnosed with a staph epi UTI and has significant dysuria and burning  He was initially treated with Macrobid and had no symptom improvement and started Zyvox yesterday.    PMH: Past Medical History:  Diagnosis Date   Arthritis    Diabetes mellitus without complication (HCC)    Hypertension    Prostate cancer (HCC)    Sleep apnea     Surgical History: Past Surgical History:  Procedure Laterality Date   APPENDECTOMY     BACK SURGERY     CYSTOSCOPY  11/12/2022   Procedure: Cystoscopy, dilation of bladder neck contracture, complex Foley catheter placement;  Surgeon: Alexander Come, MD;  Location: [facility] ORS;  Service: Urology;;   KNEE ARTHROPLASTY     PROSTATE SURGERY      Home Medications:  Allergies as of 01/28/2023       Reactions   Dilaudid [hydromorphone Hcl] Itching        Medication List        Accurate as of January 28, 2023 11:29  AM. If you have any questions, ask your nurse or doctor.          STOP taking these medications    docusate sodium 100 MG capsule Commonly known as: COLACE Stopped by: Alexander Wilcox   DULoxetine 30 MG capsule Commonly known as: CYMBALTA Stopped by: Alexander Wilcox   gabapentin 800 MG tablet Commonly known as: NEURONTIN Stopped by: Alexander Wilcox   nortriptyline 50 MG capsule Commonly known as: PAMELOR Stopped by: Alexander Wilcox   oxyCODONE 15 MG immediate release tablet Commonly known as: ROXICODONE Stopped by: Alexander Wilcox   polyethylene glycol 17 g packet Commonly known as: MIRALAX / GLYCOLAX Stopped by: Alexander Wilcox   pregabalin 75 MG capsule Commonly known as: LYRICA Stopped by: Alexander Wilcox   tamsulosin 0.4 MG Caps capsule Commonly known as: FLOMAX Stopped by: Alexander Wilcox       TAKE these medications    acetaminophen 325 MG tablet Commonly known as: TYLENOL Take 650 mg by mouth every 6 (six) hours as needed.   apixaban 5 MG Tabs tablet Commonly known as: ELIQUIS Take 2 tablets (10 mg total) by mouth 2 (two) times daily. Then from 12/12/2022 take 5 mg twice a day   atorvastatin 40 MG tablet Commonly known as: LIPITOR Take 40 mg by mouth daily.   cyanocobalamin 1000 MCG tablet  Take 1,000 mcg by mouth daily.   fenofibrate 160 MG tablet TAKE 1 TABLET DAILY   ferrous sulfate 325 (65 FE) MG tablet Take 325 mg by mouth daily.   insulin glargine-yfgn 100 UNIT/ML injection Commonly known as: SEMGLEE Inject 0.15 mLs (15 Units total) into the skin at bedtime.   Lancets 30G Misc 1 each.   linezolid 600 MG tablet Commonly known as: ZYVOX Take 600 mg by mouth 2 (two) times daily.   lisinopril 10 MG tablet Commonly known as: ZESTRIL Take 1 tablet (10 mg total) by mouth daily.   metFORMIN 500 MG 24 hr tablet Commonly known as: GLUCOPHAGE-XR Take 1 tablet by mouth 2 (two) times daily. Pt taking one in the morning and one at night    metoprolol succinate 25 MG 24 hr tablet Commonly known as: TOPROL-XL Take 25 mg by mouth daily.   nystatin powder Commonly known as: MYCOSTATIN/NYSTOP Apply 1 Application topically 2 (two) times daily. Apply to groin to absorb moisture and prevent yeast infection.   omeprazole 40 MG capsule Commonly known as: PRILOSEC Take 40 mg by mouth daily. What changed: Another medication with the same name was removed. Continue taking this medication, and follow the directions you see here. Changed by: Alexander Wilcox   UltiGuard SafePack Pen Needle 32G X 4 MM Misc Generic drug: Insulin Pen Needle Use 1 each once daily        Allergies:  Allergies  Allergen Reactions   Dilaudid [Hydromorphone Hcl] Itching    Family History: Family History  Problem Relation Age of Onset   Diabetes Sister    Diabetes Maternal Grandmother    Lung disease Brother    Kidney failure Sister     Social History:  reports that he quit smoking about 26 years ago. His smoking use included cigarettes. He started smoking about 56 years ago. He has a 60 pack-year smoking history. He has been exposed to tobacco smoke. He has never used smokeless tobacco. He reports current alcohol use. He reports that he does not currently use drugs.   Physical Exam: BP 113/70   Pulse 81   Ht 5\' 9"  (1.753 m)   Wt 230 lb (104.3 kg)   BMI 33.97 kg/m   Constitutional:  Alert and oriented, No acute distress. HEENT: Alexander Wilcox AT, moist mucus membranes.  Trachea midline, no masses. Cardiovascular: No clubbing, cyanosis, or edema. Respiratory: Normal respiratory effort, no increased work of breathing. GI: Abdomen is soft, nontender, nondistended, no abdominal masses Skin: No rashes, bruises or suspicious lesions. Neurologic: Grossly intact, no focal deficits, moving all 4 extremities. Psychiatric: Normal mood and affect.   Assessment & Plan:    1. Bladder neck contracture Status post dilation with chronic incontinence after  catheter removal, suspicious force sphincteric incontinence. PVR today 0 mL If incontinence does not improve, we'll get an opinion with Alexander Wilcox.  2. Urinary tract infection Urine culture grew staph epi and switched to Zyvox yesterday  He was instructed to call if symptoms have not improved by early next week and may need ID consult  He has a October 2024 appointment for PSA  [facility] [address] [place], [place] [address] [phone]

## 2023-01-28 NOTE — Progress Notes (Addendum)
   REFERRING PHYSICIAN:  Marisue Ivan, Md [address] [place],  [place] [address]  DOS: 11/12/22  Open reduction internal fixation of T9 fracture  HISTORY OF PRESENT ILLNESS: Alexander Wilcox is almost 3 months out from his surgery.   He was still at a facility at his last visit, but was walking short distances with a walker.   He is back at home. He is not doing any PT. He is still able to walk only short distances with a walker. He still feels like left leg will not support him. He is scheduled for MRI of left hip.   He has no pain in his legs, he is feeling more numbness/tingling in left leg. He feels like left leg is getting stronger.   He continues to urinate without control. He is seeing ID and urology for chronic UTI.   PHYSICAL EXAMINATION:  General: Patient is well developed, well nourished, calm, collected, and in no apparent distress.   NEUROLOGICAL:  General: In no acute distress.   Awake, alert, oriented to person, place, and time.  Pupils equal round and reactive to light.  Facial tone is symmetric.     Strength:            Side Iliopsoas Quads Hamstring PF DF EHL  R 5 5 5 5 5 5   L 4- 4+ 4+ 4 4 4    Incision well healed   ROS (Neurologic):  Negative except as noted above  IMAGING: Thoracic xrays dated 02/01/23:  No complications noted.  Above xrays reviewed with Dr. Myer Haff.   Radiology report for above xrays not available.    ASSESSMENT/PLAN:  Alexander Wilcox is doing reasonably well s/p above surgery. Treatment options reviewed with patient and following plan made:   - PT orders to [facility] on [address].  - Follow up with ortho for hip MRI.  - Follow up with Dr. Myer Haff in 3 months and prn.  - We again discussed that he will likely continue to improve for up to a year postop.   ADDENDUM 04/25/23:  EMG of bilateral lower extremities dated 04/12/23 by Dr. Sherryll Burger:  This is an abnormal electrodiagnostic study  consistent with 1) generalized severe sensorimotor polyneuropathy. 2) Difficult to decide amponent of radiculopathy secondary to profound peripheral neuropathy.   Results scanned into chart. Can review at his follow up on 05/12/23 with Dr. Myer Haff. He is seeing Dr. Malvin Johns as well.   Drake Leach PA-C Department of neurosurgery

## 2023-01-30 ENCOUNTER — Encounter: Payer: Self-pay | Admitting: Urology

## 2023-01-31 ENCOUNTER — Telehealth: Payer: Self-pay

## 2023-01-31 ENCOUNTER — Other Ambulatory Visit: Payer: Self-pay | Admitting: Sports Medicine

## 2023-01-31 ENCOUNTER — Encounter: Payer: Self-pay | Admitting: Urology

## 2023-01-31 ENCOUNTER — Telehealth: Payer: Self-pay | Admitting: Urology

## 2023-01-31 DIAGNOSIS — W28XXXD Contact with powered lawn mower, subsequent encounter: Secondary | ICD-10-CM

## 2023-01-31 DIAGNOSIS — S22079S Unspecified fracture of T9-T10 vertebra, sequela: Secondary | ICD-10-CM

## 2023-01-31 DIAGNOSIS — M1612 Unilateral primary osteoarthritis, left hip: Secondary | ICD-10-CM

## 2023-01-31 NOTE — Telephone Encounter (Signed)
Pt Alexander Wilcox and at visit last week, Dr Lonna Cobb told him if he was no better to give office a call.  He said he is no better.

## 2023-01-31 NOTE — Telephone Encounter (Signed)
Recommend follow-up appointment with infectious disease.  He is an established patient

## 2023-01-31 NOTE — Telephone Encounter (Signed)
Scheduled.   Sandie Ano, RN

## 2023-01-31 NOTE — Telephone Encounter (Signed)
Notified patient as instructed, patient is calling to set up a appt. With infectious disease

## 2023-01-31 NOTE — Telephone Encounter (Signed)
Patient called, he has been seen by Dr. Lonna Cobb with urology. Alexander Wilcox has been passing blood and pus in his urine, no urinary catheter in place. Denies fever or chills. Endorses constant burning, whether or not he is urinating. Reports he urinates frequently. Would like to be seen by ID.  Sandie Ano, RN

## 2023-02-01 ENCOUNTER — Other Ambulatory Visit: Admission: RE | Admit: 2023-02-01 | Payer: Medicare HMO | Source: Home / Self Care

## 2023-02-01 ENCOUNTER — Other Ambulatory Visit
Admission: RE | Admit: 2023-02-01 | Discharge: 2023-02-01 | Disposition: A | Discharge status: Home / Self Care | Payer: Medicare HMO | Source: Ambulatory Visit | Attending: Orthopedic Surgery | Admitting: Orthopedic Surgery

## 2023-02-01 ENCOUNTER — Encounter: Payer: Self-pay | Admitting: Infectious Diseases

## 2023-02-01 ENCOUNTER — Encounter: Payer: Self-pay | Admitting: Orthopedic Surgery

## 2023-02-01 ENCOUNTER — Ambulatory Visit: Payer: Medicare HMO | Admitting: Orthopedic Surgery

## 2023-02-01 ENCOUNTER — Ambulatory Visit
Admission: RE | Admit: 2023-02-01 | Discharge: 2023-02-01 | Disposition: A | Discharge status: Home / Self Care | Payer: Medicare HMO | Attending: Orthopedic Surgery | Admitting: Orthopedic Surgery

## 2023-02-01 ENCOUNTER — Ambulatory Visit
Admission: RE | Admit: 2023-02-01 | Discharge: 2023-02-01 | Disposition: A | Discharge status: Home / Self Care | Payer: Medicare HMO | Source: Ambulatory Visit | Attending: Orthopedic Surgery | Admitting: Orthopedic Surgery

## 2023-02-01 ENCOUNTER — Ambulatory Visit: Payer: Medicare HMO | Attending: Infectious Diseases | Admitting: Infectious Diseases

## 2023-02-01 VITALS — BP 138/72 | Temp 98.9°F | Ht 69.0 in | Wt 230.0 lb

## 2023-02-01 VITALS — BP 122/72 | HR 79 | Temp 96.3°F

## 2023-02-01 DIAGNOSIS — R319 Hematuria, unspecified: Secondary | ICD-10-CM | POA: Insufficient documentation

## 2023-02-01 DIAGNOSIS — Z86711 Personal history of pulmonary embolism: Secondary | ICD-10-CM | POA: Insufficient documentation

## 2023-02-01 DIAGNOSIS — G4733 Obstructive sleep apnea (adult) (pediatric): Secondary | ICD-10-CM | POA: Insufficient documentation

## 2023-02-01 DIAGNOSIS — N390 Urinary tract infection, site not specified: Secondary | ICD-10-CM

## 2023-02-01 DIAGNOSIS — Z4789 Encounter for other orthopedic aftercare: Secondary | ICD-10-CM | POA: Diagnosis not present

## 2023-02-01 DIAGNOSIS — Z794 Long term (current) use of insulin: Secondary | ICD-10-CM | POA: Diagnosis not present

## 2023-02-01 DIAGNOSIS — Z87891 Personal history of nicotine dependence: Secondary | ICD-10-CM | POA: Insufficient documentation

## 2023-02-01 DIAGNOSIS — S22079S Unspecified fracture of T9-T10 vertebra, sequela: Secondary | ICD-10-CM

## 2023-02-01 DIAGNOSIS — E785 Hyperlipidemia, unspecified: Secondary | ICD-10-CM | POA: Insufficient documentation

## 2023-02-01 DIAGNOSIS — Z87440 Personal history of urinary (tract) infections: Secondary | ICD-10-CM | POA: Insufficient documentation

## 2023-02-01 DIAGNOSIS — Z981 Arthrodesis status: Secondary | ICD-10-CM

## 2023-02-01 DIAGNOSIS — E119 Type 2 diabetes mellitus without complications: Secondary | ICD-10-CM | POA: Diagnosis not present

## 2023-02-01 DIAGNOSIS — Z09 Encounter for follow-up examination after completed treatment for conditions other than malignant neoplasm: Secondary | ICD-10-CM

## 2023-02-01 DIAGNOSIS — B9620 Unspecified Escherichia coli [E. coli] as the cause of diseases classified elsewhere: Secondary | ICD-10-CM

## 2023-02-01 DIAGNOSIS — Z7901 Long term (current) use of anticoagulants: Secondary | ICD-10-CM | POA: Insufficient documentation

## 2023-02-01 DIAGNOSIS — C61 Malignant neoplasm of prostate: Secondary | ICD-10-CM | POA: Diagnosis not present

## 2023-02-01 DIAGNOSIS — I10 Essential (primary) hypertension: Secondary | ICD-10-CM | POA: Insufficient documentation

## 2023-02-01 DIAGNOSIS — S22079D Unspecified fracture of T9-T10 vertebra, subsequent encounter for fracture with routine healing: Secondary | ICD-10-CM

## 2023-02-01 DIAGNOSIS — R32 Unspecified urinary incontinence: Secondary | ICD-10-CM | POA: Insufficient documentation

## 2023-02-01 DIAGNOSIS — W19XXXD Unspecified fall, subsequent encounter: Secondary | ICD-10-CM

## 2023-02-01 NOTE — Progress Notes (Signed)
NAME: Alexander Wilcox  DOB: Oct 25, 1950  MRN: [id number]  Date/Time: 02/01/2023 9:44 AM   Subjective:  Pt here with his wife ? Alexander Wilcox is a 72 y.o. male with a history of ca prostate, HTN, IDDM, OSA, HLD, cervical myelopathy s/p cervical fusion, Fracture T9 with ORIF , posterolateral arthrodesis T7-T12 on 11/12/22, bladder neck contracture requiring dilatation  and foley catheter during the neurosurgery on 5/10 , followed by hospitalization 12/01/22-12/07/22 for complicated UTI He also had pulmonary emboli and started on eliquis. Had ecoli in urine culture. After IV antibiotic in the hospital was discharged on po quinolone until 12/14/22. HE saw urologist and foley was removed on 12/16/22. Pt has had urinary incontinence and also has blood and clots in the urine=- He had a urine culture done by PCP on  01/19/23 and Staph epidermidis and has been started on linezolid last week. Dr.Stoioff referred the patient to me as he was still having symptoms inspite of linezolid   Past Medical History:  Diagnosis Date   Arthritis    Diabetes mellitus without complication (HCC)    Hypertension    Prostate cancer (HCC)    Sleep apnea     Past Surgical History:  Procedure Laterality Date   APPENDECTOMY     BACK SURGERY     CYSTOSCOPY  11/12/2022   Procedure: Cystoscopy, dilation of bladder neck contracture, complex Foley catheter placement;  Surgeon: Sondra Come, MD;  Location: [facility] ORS;  Service: Urology;;   KNEE ARTHROPLASTY     PROSTATE SURGERY      Social History   Socioeconomic History   Marital status: Married    Spouse name: Not on file   Number of children: Not on file   Years of education: Not on file   Highest education level: Not on file  Occupational History   Not on file  Tobacco Use   Smoking status: Former    Current packs/day: 0.00    Average packs/day: 2.0 packs/day for 30.0 years (60.0 ttl pk-yrs)    Types: Cigarettes    Start date: 07/05/1966    Quit date: 07/05/1996     Years since quitting: 26.5    Passive exposure: Past   Smokeless tobacco: Never  Vaping Use   Vaping status: Never Used  Substance and Sexual Activity   Alcohol use: Yes    Comment: daily    Drug use: Not Currently   Sexual activity: Not on file  Other Topics Concern   Not on file  Social History Narrative   Not on file   Social Determinants of Health   Financial Resource Strain: Low Risk  (12/08/2022)   Received from [facility], [facility]   Overall Financial Resource Strain (CARDIA)    Difficulty of Paying Living Expenses: Not very hard  Food Insecurity: No Food Insecurity (12/08/2022)   Received from [facility], [facility]   Hunger Vital Sign    Worried About Running Out of Food in the Last Year: Never true    Ran Out of Food in the Last Year: Never true  Transportation Needs: No Transportation Needs (12/08/2022)   [facility] from [facility], [facility]   [facility] - [facility]    In the past 12 months, has lack of transportation kept you from medical appointments or from getting medications?: No    Lack of Transportation (Non-Medical): No  Physical Activity: Not on file  Stress: Not on  file  Social Connections: Not on file  Intimate Partner Violence: Not At Risk (11/12/2022)   Humiliation, Afraid, Rape, and Kick questionnaire    Fear of Current or Ex-Partner: No    Emotionally Abused: No    Physically Abused: No    Sexually Abused: No    Family History  Problem Relation Age of Onset   Diabetes Sister    Diabetes Maternal Grandmother    Lung disease Brother    Kidney failure Sister    Allergies  Allergen Reactions   Dilaudid [Hydromorphone Hcl] Itching   I? Current Outpatient Medications  Medication Sig Dispense Refill   apixaban (ELIQUIS) 5 MG TABS tablet Take 2 tablets (10 mg total) by mouth 2 (two) times daily. Then from 12/12/2022 take 5 mg twice a  day 60 tablet 3   atorvastatin (LIPITOR) 40 MG tablet Take 40 mg by mouth daily.     cyanocobalamin 1000 MCG tablet Take 1,000 mcg by mouth daily.     fenofibrate 160 MG tablet TAKE 1 TABLET DAILY     ferrous sulfate 325 (65 FE) MG tablet Take 325 mg by mouth daily.     insulin glargine, 1 Unit Dial, (TOUJEO SOLOSTAR) 300 UNIT/ML Solostar Pen Inject 50 Units into the skin daily.     Insulin Pen Needle (ULTIGUARD SAFEPACK PEN NEEDLE) 32G X 4 MM MISC Use 1 each once daily     Lancets 30G MISC 1 each.     linezolid (ZYVOX) 600 MG tablet Take 600 mg by mouth 2 (two) times daily.     lisinopril (ZESTRIL) 10 MG tablet Take 1 tablet (10 mg total) by mouth daily. 30 tablet 1   metFORMIN (GLUCOPHAGE-XR) 500 MG 24 hr tablet Take 1 tablet by mouth 2 (two) times daily. Pt taking one in the morning and one at night     metoprolol succinate (TOPROL-XL) 25 MG 24 hr tablet Take 25 mg by mouth daily.     nystatin (MYCOSTATIN/NYSTOP) powder Apply 1 Application topically 2 (two) times daily. Apply to groin to absorb moisture and prevent yeast infection. 15 g 2   omeprazole (PRILOSEC) 40 MG capsule Take 40 mg by mouth daily.     Semaglutide,0.25 or 0.5MG /DOS, (OZEMPIC, 0.25 OR 0.5 MG/DOSE,) 2 MG/1.5ML SOPN Inject 0.5 mLs into the skin once a week.     No current facility-administered medications for this visit.     Abtx:  Anti-infectives (From admission, onward)    None       REVIEW OF SYSTEMS:  Const: negative fever, negative chills, negative weight loss Eyes: negative diplopia or visual changes, negative eye pain ENT: negative coryza, negative sore throat Resp: negative cough, hemoptysis, dyspnea Cards: negative for chest pain, palpitations, lower extremity edema GU: incontinence dysuria and hematuria. Also has apainful spot mid penis and he usually squeezes it to get relief GI: lower abdominal pain Skin: negative for rash and pruritus Heme: negative for easy bruising and gum/nose bleeding MS:  left leg weakness Neurolo:weakness left leg  Psych: negative for feelings of anxiety, depression  Endocrine:  diabetes Allergy/Immunology- as above Objective:  VITALS:  BP 122/72   Pulse 79   Temp (!) 96.3 F (35.7 C) (Temporal)   PHYSICAL EXAM:  General: Alert, cooperative, no distress at rest , in wheel chair.  Head: Normocephalic, without obvious abnormality, atraumatic. Eyes: Conjunctivae clear, anicteric sclerae. Pupils are equal ENT Nares normal. No drainage or sinus tenderness. Lips, mucosa, and tongue normal. No Thrush Neck: Supple, symmetrical, no adenopathy,  thyroid: non tender no carotid bruit and no JVD. Back: No CVA tenderness. Lungs: Clear to auscultation bilaterally. No Wheezing or Rhonchi. No rales. Heart: Regular rate and rhythm, no murmur, rub or gallop. Abdomen: Soft, suprapubic tenderness Extremities: atraumatic, no cyanosis. No edema. No clubbing Skin: No rashes or lesions. Or bruising Lymph: Cervical, supraclavicular normal. Neurologic: left lower limb weakness Pertinent Labs Lab Results 01/10/23 WBC 7.4 HB 12.8 PLT 207 Cr 1.1  Microbiology: 01/19/23 Urine culture- Staph epidermidis ? Impression/Recommendation ? Complicated UTI recently with E. coli this was following a bladder neck stricture needing dilatation and Foley placement.  Foley was removed June 13 Patient completed 2 weeks of antibiotics on 12/14/2022 He continues to have incontinence and hematuria and blood clots He had seen urologist recently Recent urine culture 01/19/2023 had Staph epidermidis and is currently on linezolid Staph epidermidis is unlikely to cause UTI This is really a skin contaminant as patient has trouble collecting a sterile specimen because he is not circumcised Today his wife cleaned the foreskin and glans and collected a specimen for urine culture I asked him to stop the linezolid as it is not made any difference to his symptoms. Will call him once we have the  results Patient likely has some sort of an anatomical problem which is causing him to have incontinence and hematuria.  He also is on Eliquis for recent PE  CA prostate  History of recent T9 fracture status post arthrodesis on 11/12/2022 surgical site is healed well  Weakness left leg will discuss with urology t___________________________________________________ Discussed with patient, and wife in detail Will discuss with Dr.Stoioff Note:  This document was prepared using Dragon voice recognition software and may include unintentional dictation errors.

## 2023-02-01 NOTE — Patient Instructions (Signed)
It was nice to see you today.   I am glad that you are feeling better!   I sent PT orders to [facility] on [address]. They should call you to schedule or you can call them at [phone].   You will need xrays prior to your follow up in 3 months.   Please call with any questions or concerns.   Drake Leach PA-C [phone]

## 2023-02-04 ENCOUNTER — Telehealth: Payer: Self-pay

## 2023-02-04 NOTE — Telephone Encounter (Signed)
Patient informed that urine culture was negative and Sr. Stioff should be reaching out to him. Patient verbalized understanding.   [name]

## 2023-02-04 NOTE — Telephone Encounter (Signed)
-----   Message from Lynn Ito sent at 02/04/2023 11:30 AM EDT ----- Dr.Stioff, this was the patient you had referred to me . He has bladder stricture, hematuria. Urine culture negative. I understand you are going t be seeing him soon? Thanks JR ----- Message ----- From: Leory Plowman, Lab In [place] Sent: 02/02/2023  10:01 AM EDT To: Lynn Ito, MD

## 2023-02-04 NOTE — Telephone Encounter (Signed)
Patient advised and scheduled.  

## 2023-02-04 NOTE — Telephone Encounter (Signed)
Schedule cystoscopy

## 2023-02-04 NOTE — Telephone Encounter (Signed)
Pt LMOM that went for appt at [facility] and everything was fine.  He said he is on week #4 of being in pain and bleeding.

## 2023-02-09 ENCOUNTER — Encounter: Payer: Self-pay | Admitting: Sports Medicine

## 2023-02-11 ENCOUNTER — Other Ambulatory Visit: Payer: Medicare HMO | Admitting: Urology

## 2023-02-11 ENCOUNTER — Encounter: Payer: Self-pay | Admitting: Urology

## 2023-02-11 ENCOUNTER — Telehealth: Payer: Self-pay

## 2023-02-11 ENCOUNTER — Other Ambulatory Visit
Admission: RE | Admit: 2023-02-11 | Discharge: 2023-02-11 | Disposition: A | Discharge status: Home / Self Care | Payer: Medicare HMO | Source: Ambulatory Visit | Attending: Urology | Admitting: Urology

## 2023-02-11 ENCOUNTER — Other Ambulatory Visit: Payer: Self-pay

## 2023-02-11 ENCOUNTER — Ambulatory Visit: Payer: Medicare HMO | Admitting: Urology

## 2023-02-11 VITALS — BP 114/76 | HR 106 | Ht 69.0 in | Wt 228.0 lb

## 2023-02-11 DIAGNOSIS — N4889 Other specified disorders of penis: Secondary | ICD-10-CM

## 2023-02-11 DIAGNOSIS — N369 Urethral disorder, unspecified: Secondary | ICD-10-CM

## 2023-02-11 DIAGNOSIS — N390 Urinary tract infection, site not specified: Secondary | ICD-10-CM

## 2023-02-11 DIAGNOSIS — N210 Calculus in bladder: Secondary | ICD-10-CM

## 2023-02-11 LAB — URINALYSIS, COMPLETE

## 2023-02-11 LAB — MICROSCOPIC EXAMINATION: RBC, Urine: >30 /hpf — AB (ref 0–2)

## 2023-02-11 MED ORDER — OXYBUTYNIN CHLORIDE 5 MG PO TABS: ORAL_TABLET | ORAL | 0 refills | Status: DC

## 2023-02-11 NOTE — Telephone Encounter (Signed)
I spoke with Alexander Wilcox. We have discussed possible surgery dates and Tuesday August 13th, 2024 was agreed upon by all parties. Patient given information about surgery date, what to expect pre-operatively and post operatively.  We discussed that a Pre-Admission Testing office will be calling to set up the pre-op visit that will take place prior to surgery, and that these appointments are typically done over the phone with a Pre-Admissions RN. Informed patient that our office will communicate any additional care to be provided after surgery. Patients questions or concerns were discussed during our call. Advised to call our office should there be any additional information, questions or concerns that arise. Patient verbalized understanding.

## 2023-02-11 NOTE — Progress Notes (Signed)
Surgical Physician Order Form [facility]  Dr. Irineo Axon, MD   * Scheduling expectation : 02/15/2023   *Length of Case: 60 minutes   *Clearance needed: yes-recent PE, on Ozempic   *Anticoagulation Instructions: May continue all anticoagulants   *Aspirin Instructions: N/A   *Post-op visit Date/Instructions:  1 week cath removal   *Diagnosis: Bladder calculi   *Procedure:  Cystolitholapaxy >2.5cm (16109)     Additional orders: N/A   -Admit type: OUTpatient   -Anesthesia: Choice   -VTE Prophylaxis Standing Order SCD's       Other:    -Standing Lab Orders Per Anesthesia     Lab other: UA&Urine Culture-ordered   -Standing Test orders EKG/Chest x-ray per Anesthesia             Test other:    - Medications: Gentamicin per Pharmacy   -Other orders:  N/A

## 2023-02-11 NOTE — Progress Notes (Signed)
   [facility] Surgical Posting Form  Surgery Date: Date: 02/15/2023  Surgeon: Dr. Irineo Axon, MD  Inpt ( No  )   Outpt (Yes)   Obs ( No  )   Diagnosis: N21.0 Bladder Stone  -CPT: [phone]  Surgery: Cystolitholapaxy  Stop Anticoagulations: No, may continue Eliquis  Cardiac/Medical/Pulmonary Clearance needed: no  *Orders entered into EPIC  Date: 02/11/23   *Case booked in [place]  Date: 02/11/23  *Notified pt of Surgery: Date: 02/11/23  PRE-OP UA & CX: yes, obtained in clinic on 02/11/2023  *Placed into Prior Authorization Work Angela Nevin Date: 02/11/23  Assistant/laser/rep:No

## 2023-02-11 NOTE — Progress Notes (Signed)
   02/11/23  CC:  Chief Complaint  Patient presents with   Cysto    HPI: Refer to my previous note 01/28/2023.  His symptoms did not improve on Zyvox and he was seen by infectious disease.  It was felt staph epi was a skin contaminant and a repeat urine culture was ordered which showed no growth.  He continues to have penile pain and what he feels like is a blockage in the urethra.  He did pass a stone microparticle earlier in the week.  Due to persistent symptoms cystoscopy was recommended  Blood pressure 114/76, pulse (!) 106, height 5\' 9"  (1.753 m), weight 228 lb (103.4 kg). NED. A&Ox3.   No respiratory distress   Abd soft, NT, ND Uncircumcised phallus with bilateral descended testicles  Cystoscopy Procedure Note  Patient identification was confirmed, informed consent was obtained, and patient was prepped using Betadine solution.  Lidocaine jelly was administered per urethral meatus.     Pre-Procedure: - Inspection reveals a normal caliber urethral meatus.  Procedure: The flexible cystoscope was introduced without difficulty - No urethral strictures/lesions are present. -There were multiple minute calcifications throughout the urethra with mucosal erythema.  The cystoscope was negotiated in the bladder which demonstrated erythematous mucosa and large soft calcifications.  No discernible prostate tissue was noted   Post-Procedure: - Patient tolerated the procedure well  Assessment/ Plan: -It was elected to place a Foley catheter until the bladder calcifications can be treated.  A Sensor wire was placed through the cystoscope and into the bladder.  A 16 [place] council catheter was advanced over the wire without difficulty.  The catheter was irrigated with return of pink-tinged effluent and placed to gravity drainage -Urine was sent for culture -Schedule cystoscopy under anesthesia with cystolitholapaxy 02/15/2023 -The procedure was discussed with potential risks of infection -Rx  oxybutynin sent for bladder spasms    Riki Altes, MD

## 2023-02-12 ENCOUNTER — Ambulatory Visit
Admission: RE | Admit: 2023-02-12 | Discharge: 2023-02-12 | Disposition: A | Discharge status: Home / Self Care | Payer: Medicare HMO | Source: Ambulatory Visit | Attending: Sports Medicine | Admitting: Sports Medicine

## 2023-02-12 DIAGNOSIS — M25552 Pain in left hip: Secondary | ICD-10-CM | POA: Diagnosis not present

## 2023-02-12 DIAGNOSIS — R600 Localized edema: Secondary | ICD-10-CM | POA: Diagnosis not present

## 2023-02-12 DIAGNOSIS — S22079S Unspecified fracture of T9-T10 vertebra, sequela: Secondary | ICD-10-CM

## 2023-02-12 DIAGNOSIS — W28XXXD Contact with powered lawn mower, subsequent encounter: Secondary | ICD-10-CM

## 2023-02-12 DIAGNOSIS — M1612 Unilateral primary osteoarthritis, left hip: Secondary | ICD-10-CM

## 2023-02-14 ENCOUNTER — Telehealth: Payer: Self-pay | Admitting: *Deleted

## 2023-02-14 ENCOUNTER — Encounter
Admission: RE | Admit: 2023-02-14 | Discharge: 2023-02-14 | Disposition: A | Discharge status: Home / Self Care | Payer: Medicare HMO | Source: Ambulatory Visit | Attending: Urology | Admitting: Urology

## 2023-02-14 VITALS — Ht 69.0 in | Wt 228.0 lb

## 2023-02-14 DIAGNOSIS — E1169 Type 2 diabetes mellitus with other specified complication: Secondary | ICD-10-CM

## 2023-02-14 DIAGNOSIS — Z01812 Encounter for preprocedural laboratory examination: Secondary | ICD-10-CM

## 2023-02-14 HISTORY — DX: Hyperlipidemia, unspecified: E78.5

## 2023-02-14 HISTORY — DX: Calculus in bladder: N21.0

## 2023-02-14 HISTORY — DX: Type 2 diabetes mellitus without complications: E11.9

## 2023-02-14 HISTORY — DX: Obstructive sleep apnea (adult) (pediatric): G47.33

## 2023-02-14 HISTORY — DX: Gastro-esophageal reflux disease without esophagitis: K21.9

## 2023-02-14 HISTORY — DX: Occlusion and stenosis of unspecified carotid artery: I65.29

## 2023-02-14 HISTORY — DX: Personal history of urinary calculi: Z87.442

## 2023-02-14 MED ORDER — SODIUM CHLORIDE 0.9 % IV SOLN
INTRAVENOUS | Status: DC
  Administered 2023-02-15: 500 mL via INTRAVENOUS

## 2023-02-14 MED ORDER — GENTAMICIN SULFATE 40 MG/ML IJ SOLN
5.0000 mg/kg | INTRAVENOUS | Status: AC
  Administered 2023-02-15: 520 mg via INTRAVENOUS
  Filled 2023-02-14: qty 13

## 2023-02-14 NOTE — Patient Instructions (Signed)
Your procedure is scheduled on: Tuesday, August 13 Report to the Registration Desk on the 1st floor of the [facility] To find out your arrival time, please call [phone] between 1PM - 3PM on: Monday, August 12 If your arrival time is 6:00 am, do not arrive before that time as the [facility] entrance doors do not open until 6:00 am.  REMEMBER: Instructions that are not followed completely may result in serious medical risk, up to and including death; or upon the discretion of your surgeon and anesthesiologist your surgery may need to be rescheduled.  Do not eat or drink after midnight the night before surgery.  No gum chewing or hard candies.  One week prior to surgery: Stop Anti-inflammatories (NSAIDS) such as Advil, Aleve, Ibuprofen, Motrin, Naproxen, Naprosyn and Aspirin based products such as Excedrin, Goody's Powder, BC Powder. Stop ANY OVER THE COUNTER supplements until after surgery. You may however, continue to take Tylenol if needed for pain up until the day of surgery.  Continue taking all prescribed medications with the exception of the following:  Metformin - do not take any more as of now, August 12.  Ozempic - do not take dose today, Monday, August 12. Resume on your regular day Monday, August 19.  TAKE ONLY THESE MEDICATIONS THE MORNING OF SURGERY WITH A SIP OF WATER:  Apixaban (Eliquis) per Dr. Lonna Cobb - continue taking Metoprolol Omeprazole (Prilosec)  Do not take any insulin (Toujeo) on the morning of surgery.  No Alcohol for 24 hours before or after surgery.  No Smoking including e-cigarettes for 24 hours before surgery.  No chewable tobacco products for at least 6 hours before surgery.  No nicotine patches on the day of surgery.  Do not use any "recreational" drugs for at least a week (preferably 2 weeks) before your surgery.  Please be advised that the combination of cocaine and anesthesia may have negative outcomes, up to and including death. If  you test positive for cocaine, your surgery will be cancelled.  On the morning of surgery brush your teeth with toothpaste and water, you may rinse your mouth with mouthwash if you wish. Do not swallow any toothpaste or mouthwash.  Do not wear jewelry, make-up, hairpins, clips or nail polish.  Do not wear lotions, powders, or perfumes.   Do not shave body hair from the neck down 48 hours before surgery.  Contact lenses, hearing aids and dentures may not be worn into surgery.  Do not bring valuables to the hospital. [facility] is not responsible for any missing/lost belongings or valuables.   Bring your C-PAP to the hospital in case you may have to spend the night.   Notify your doctor if there is any change in your medical condition (cold, fever, infection).  Wear comfortable clothing (specific to your surgery type) to the hospital.  After surgery, you can help prevent lung complications by doing breathing exercises.  Take deep breaths and cough every 1-2 hours.   If you are being discharged the day of surgery, you will not be allowed to drive home. You will need a responsible individual to drive you home and stay with you for 24 hours after surgery.   If you are taking public transportation, you will need to have a responsible individual with you.  Please call the Pre-admissions Testing Dept. at [phone] if you have any questions about these instructions.  Surgery Visitation Policy:  Patients having surgery or a procedure may have two visitors.  Children  under the age of 79 must have an adult with them who is not the patient.

## 2023-02-14 NOTE — Telephone Encounter (Signed)
Patient called and left a message at 2:00 pm and states his cathter fell out last night . He states he is having urine and blood  come out of penis. Talked with Sam the pa and she states patient can use his condom  cath until surgery tomorrow. Advised patient.

## 2023-02-15 ENCOUNTER — Ambulatory Visit: Payer: Medicare HMO | Admitting: Urgent Care

## 2023-02-15 ENCOUNTER — Encounter: Payer: Self-pay | Admitting: Urology

## 2023-02-15 ENCOUNTER — Ambulatory Visit
Admission: RE | Admit: 2023-02-15 | Discharge: 2023-02-15 | Disposition: A | Discharge status: Home / Self Care | Payer: Medicare HMO | Attending: Urology | Admitting: Urology

## 2023-02-15 ENCOUNTER — Ambulatory Visit: Payer: Medicare HMO | Admitting: Anesthesiology

## 2023-02-15 ENCOUNTER — Other Ambulatory Visit: Payer: Self-pay

## 2023-02-15 ENCOUNTER — Encounter
Admission: RE | Disposition: A | Discharge status: Home / Self Care | Payer: Self-pay | Source: Home / Self Care | Attending: Urology

## 2023-02-15 ENCOUNTER — Other Ambulatory Visit: Payer: Self-pay | Admitting: Urology

## 2023-02-15 DIAGNOSIS — Z6833 Body mass index (BMI) 33.0-33.9, adult: Secondary | ICD-10-CM | POA: Diagnosis not present

## 2023-02-15 DIAGNOSIS — G4730 Sleep apnea, unspecified: Secondary | ICD-10-CM | POA: Diagnosis not present

## 2023-02-15 DIAGNOSIS — E1140 Type 2 diabetes mellitus with diabetic neuropathy, unspecified: Secondary | ICD-10-CM | POA: Insufficient documentation

## 2023-02-15 DIAGNOSIS — I10 Essential (primary) hypertension: Secondary | ICD-10-CM | POA: Diagnosis not present

## 2023-02-15 DIAGNOSIS — Z87891 Personal history of nicotine dependence: Secondary | ICD-10-CM | POA: Insufficient documentation

## 2023-02-15 DIAGNOSIS — Z923 Personal history of irradiation: Secondary | ICD-10-CM | POA: Diagnosis not present

## 2023-02-15 DIAGNOSIS — K219 Gastro-esophageal reflux disease without esophagitis: Secondary | ICD-10-CM | POA: Insufficient documentation

## 2023-02-15 DIAGNOSIS — Z01812 Encounter for preprocedural laboratory examination: Secondary | ICD-10-CM

## 2023-02-15 DIAGNOSIS — N3289 Other specified disorders of bladder: Secondary | ICD-10-CM | POA: Diagnosis not present

## 2023-02-15 DIAGNOSIS — N210 Calculus in bladder: Secondary | ICD-10-CM | POA: Insufficient documentation

## 2023-02-15 DIAGNOSIS — Z8546 Personal history of malignant neoplasm of prostate: Secondary | ICD-10-CM | POA: Diagnosis not present

## 2023-02-15 DIAGNOSIS — Z87448 Personal history of other diseases of urinary system: Secondary | ICD-10-CM | POA: Diagnosis not present

## 2023-02-15 DIAGNOSIS — E1169 Type 2 diabetes mellitus with other specified complication: Secondary | ICD-10-CM

## 2023-02-15 HISTORY — PX: CYSTOSCOPY WITH LITHOLAPAXY: SHX1425

## 2023-02-15 LAB — GLUCOSE, CAPILLARY
Glucose-Capillary: 217 mg/dL — ABNORMAL HIGH (ref 70–99)
Glucose-Capillary: 223 mg/dL — ABNORMAL HIGH (ref 70–99)

## 2023-02-15 SURGERY — CYSTOSCOPY, WITH BLADDER CALCULUS LITHOLAPAXY
Anesthesia: General

## 2023-02-15 MED ORDER — CHLORHEXIDINE GLUCONATE 0.12 % MT SOLN
15.0000 mL | Freq: Once | OROMUCOSAL | Status: AC
  Administered 2023-02-15: 15 mL via OROMUCOSAL

## 2023-02-15 MED ORDER — FENTANYL CITRATE (PF) 100 MCG/2ML IJ SOLN
25.0000 ug | INTRAMUSCULAR | Status: DC | PRN
  Administered 2023-02-15 (×4): 25 ug via INTRAVENOUS

## 2023-02-15 MED ORDER — CHLORHEXIDINE GLUCONATE 0.12 % MT SOLN
OROMUCOSAL | Status: AC
  Filled 2023-02-15: qty 15

## 2023-02-15 MED ORDER — OXYCODONE HCL 5 MG PO TABS
ORAL_TABLET | ORAL | Status: AC
  Filled 2023-02-15: qty 1

## 2023-02-15 MED ORDER — ROCURONIUM BROMIDE 100 MG/10ML IV SOLN
INTRAVENOUS | Status: DC | PRN
  Administered 2023-02-15: 50 mg via INTRAVENOUS

## 2023-02-15 MED ORDER — FENTANYL CITRATE (PF) 100 MCG/2ML IJ SOLN
INTRAMUSCULAR | Status: AC
  Filled 2023-02-15: qty 2

## 2023-02-15 MED ORDER — SUGAMMADEX SODIUM 200 MG/2ML IV SOLN
INTRAVENOUS | Status: DC | PRN
  Administered 2023-02-15: 200 mg via INTRAVENOUS

## 2023-02-15 MED ORDER — FENTANYL CITRATE (PF) 100 MCG/2ML IJ SOLN
INTRAMUSCULAR | Status: DC | PRN
  Administered 2023-02-15 (×2): 50 ug via INTRAVENOUS

## 2023-02-15 MED ORDER — ACETAMINOPHEN 10 MG/ML IV SOLN: 1000.0000 mg | Freq: Once | INTRAVENOUS | Status: DC | PRN

## 2023-02-15 MED ORDER — LIDOCAINE HCL (CARDIAC) PF 100 MG/5ML IV SOSY
PREFILLED_SYRINGE | INTRAVENOUS | Status: DC | PRN
  Administered 2023-02-15: 100 mg via INTRAVENOUS

## 2023-02-15 MED ORDER — ACETAMINOPHEN 10 MG/ML IV SOLN
INTRAVENOUS | Status: DC | PRN
  Administered 2023-02-15: 1000 mg via INTRAVENOUS

## 2023-02-15 MED ORDER — GLYCOPYRROLATE 0.2 MG/ML IJ SOLN
INTRAMUSCULAR | Status: DC | PRN
  Administered 2023-02-15: .1 mg via INTRAVENOUS

## 2023-02-15 MED ORDER — CEFUROXIME AXETIL 250 MG PO TABS: 250.0000 mg | ORAL_TABLET | Freq: Two times a day (BID) | ORAL | 0 refills | Status: AC

## 2023-02-15 MED ORDER — DEXAMETHASONE SODIUM PHOSPHATE 10 MG/ML IJ SOLN
INTRAMUSCULAR | Status: DC | PRN
  Administered 2023-02-15: 5 mg via INTRAVENOUS

## 2023-02-15 MED ORDER — ACETAMINOPHEN 10 MG/ML IV SOLN
INTRAVENOUS | Status: AC
  Filled 2023-02-15: qty 100

## 2023-02-15 MED ORDER — PHENYLEPHRINE 80 MCG/ML (10ML) SYRINGE FOR IV PUSH (FOR BLOOD PRESSURE SUPPORT)
PREFILLED_SYRINGE | INTRAVENOUS | Status: DC | PRN
  Administered 2023-02-15: 80 ug via INTRAVENOUS

## 2023-02-15 MED ORDER — OXYCODONE HCL 5 MG/5ML PO SOLN: 5.0000 mg | Freq: Once | ORAL | Status: AC | PRN

## 2023-02-15 MED ORDER — PROMETHAZINE HCL 25 MG/ML IJ SOLN: 6.2500 mg | INTRAMUSCULAR | Status: DC | PRN

## 2023-02-15 MED ORDER — OXYCODONE HCL 5 MG PO TABS: 5.0000 mg | ORAL_TABLET | ORAL | 0 refills | Status: DC | PRN

## 2023-02-15 MED ORDER — SODIUM CHLORIDE 0.9 % IR SOLN
Status: DC | PRN
  Administered 2023-02-15 (×4): 6000 mL
  Administered 2023-02-15: 6000 mL via INTRAVESICAL

## 2023-02-15 MED ORDER — ONDANSETRON HCL 4 MG/2ML IJ SOLN
INTRAMUSCULAR | Status: DC | PRN
  Administered 2023-02-15 (×2): 4 mg via INTRAVENOUS

## 2023-02-15 MED ORDER — PROPOFOL 10 MG/ML IV BOLUS
INTRAVENOUS | Status: DC | PRN
Start: 2023-02-15 — End: 2023-02-15
  Administered 2023-02-15: 60 mg via INTRAVENOUS

## 2023-02-15 MED ORDER — OXYCODONE HCL 5 MG PO TABS
5.0000 mg | ORAL_TABLET | Freq: Once | ORAL | Status: AC | PRN
  Administered 2023-02-15: 5 mg via ORAL

## 2023-02-15 MED ORDER — DROPERIDOL 2.5 MG/ML IJ SOLN: 0.6250 mg | Freq: Once | INTRAMUSCULAR | Status: DC | PRN

## 2023-02-15 SURGICAL SUPPLY — 22 items
BAG DRAIN SIEMENS DORNER NS (MISCELLANEOUS) ×2 IMPLANT
BAG DRN NS LF (MISCELLANEOUS) ×1
BASKET STONE 3X4X90X20 (MISCELLANEOUS) IMPLANT
BSKT STON RTRVL 90X20 3FR (MISCELLANEOUS) ×1
CATH FOL 2WAY LX 18X30 (CATHETERS) IMPLANT
CATH URETL OPEN 5X70 (CATHETERS) IMPLANT
FIBER LASER MOSES 365 DFL (Laser) IMPLANT
GLOVE BIOGEL PI IND STRL 7.5 (GLOVE) ×2 IMPLANT
GOWN STRL REUS W/ TWL LRG LVL3 (GOWN DISPOSABLE) ×4 IMPLANT
GOWN STRL REUS W/ TWL XL LVL3 (GOWN DISPOSABLE) ×2 IMPLANT
GOWN STRL REUS W/TWL LRG LVL3 (GOWN DISPOSABLE) ×2
GOWN STRL REUS W/TWL XL LVL3 (GOWN DISPOSABLE) ×1
IV NS IRRIG 3000ML ARTHROMATIC (IV SOLUTION) ×4 IMPLANT
KIT TURNOVER CYSTO (KITS) ×2 IMPLANT
LOOP CUT BIPOLAR 24F LRG (ELECTROSURGICAL) IMPLANT
PACK CYSTO AR (MISCELLANEOUS) ×2 IMPLANT
SET IRRIG Y TYPE TUR BLADDER L (SET/KITS/TRAYS/PACK) ×2 IMPLANT
SURGILUBE 2OZ TUBE FLIPTOP (MISCELLANEOUS) ×2 IMPLANT
SYR TOOMEY IRRIG 70ML (MISCELLANEOUS) ×1
SYRINGE TOOMEY IRRIG 70ML (MISCELLANEOUS) ×2 IMPLANT
WATER STERILE IRR 1000ML POUR (IV SOLUTION) ×2 IMPLANT
WATER STERILE IRR 3000ML UROMA (IV SOLUTION) IMPLANT

## 2023-02-15 NOTE — Op Note (Signed)
   Preoperative diagnosis:  Bladder calculi History bladder neck contracture Prostate cancer  Postoperative diagnosis:  Same  Procedure: Cystolitholapaxy (>2.5 cm)  Surgeon: Riki Altes, MD  Anesthesia: General  Complications: None  Intraoperative findings:  Dystrophic calcification prostatic urethra; no stricture or bladder neck contracture Multiple bladder calculi; largest measuring 3 cm.  The bladder calculi with a thin shell of calculus with a inner core of fibrinous material  EBL: Minimal  Specimens: Fibrinous material bladder  Indication: Alexander Wilcox is a 72 y.o. male with a history of prostate cancer.  He was initially diagnosed in 2002 and treated with HIFU; prostate cancer recurrence 2006 and underwent retreatment with HIFU.  Treated 2017 with radiation therapy + ADT and started on ADT in 2021 for biochemical recurrence.  Recently underwent spine surgery and Foley catheter unable to be placed and found to have a bladder neck contracture which was dilated by Dr. Richardo Wilcox.  After catheter was removed he has had urinary incontinence and chronic penile pain.  Office cystoscopy remarkable for calcifications of the proximal urethra and multiple bladder calcifications.  After reviewing the management options for treatment, he elected to proceed with the above surgical procedure(s). We have discussed the potential benefits and risks of the procedure, side effects of the proposed treatment, the likelihood of the patient achieving the goals of the procedure, and any potential problems that might occur during the procedure or recuperation. Informed consent has been obtained.  Description of procedure:  The patient was taken to the operating room and general anesthesia was induced.  The patient was placed in the dorsal lithotomy position, prepped and draped in the usual sterile fashion, and preoperative antibiotics were administered. A preoperative time-out was performed.   A 21  French cystoscope was lubricated, inserted per urethra and advanced proximally under direct vision with findings as described above.  The cystoscope was removed and the distal urethra was dilated with Alexander Wilcox sounds from 20-26 [place].  A 24 French continuous-flow resectoscope sheath with visual obturator was then lubricated, placed per urethra and advanced proximally into the bladder.  The visual artery was removed and replaced with a laser bridge  The bladder calcifications were dusted with a 375 m holmium laser fiber at a setting of 0.3J/80 Hz and subsequently increased to 0.3J/120 Hz.  After dusting of the calcifications thick fibrinous material was identified as described above.  This would not irrigate through the resectoscope sheath and would not separate with bipolar resection.  It was subsequently removed from the bladder with a 3 [place] Alexander Wilcox basket placed through the resectoscope sheath with a laser bridge.  Once all material was removed from the bladder a few calcifications present at the bladder neck were removed via irrigation.  The resectoscope was removed and a an 61 French Foley catheter was placed without difficulty.  The balloon was inflated with 10 mL of sterile water.  The catheter was irrigated with return of pink-tinged effluent.  The catheter was then placed to gravity drainage.  After anesthetic reversal he was transported to the PACU in stable condition.  Plan: Foley catheter removal 1 week   Riki Altes, M.D.

## 2023-02-15 NOTE — Anesthesia Preprocedure Evaluation (Addendum)
Anesthesia Evaluation  Patient identified by MRN, date of birth, ID band Patient awake    Reviewed: Allergy & Precautions, NPO status , Patient's Chart, lab work & pertinent test results  History of Anesthesia Complications Negative for: history of anesthetic complications  Airway Mallampati: IV   Neck ROM: Limited    Dental   Bridges :   Pulmonary sleep apnea , former smoker   Pulmonary exam normal breath sounds clear to auscultation       Cardiovascular Exercise Tolerance: Poor hypertension, (-) angina  Rhythm:Regular Rate:Tachycardia  Myocardial perfusion 04/02/20: Negative Lexiscan stress.  LV function normal.  No evidence of significant ischemia noted during stress images.  Follow-up as planned.  Low risk study.   Echo 04/02/20:  NORMAL LEFT VENTRICULAR SYSTOLIC FUNCTION   WITH MILD LVH  NORMAL RIGHT VENTRICULAR SYSTOLIC FUNCTION  TRIVIAL REGURGITATION NOTED  NO VALVULAR STENOSIS  Closest EF: >55% (Estimated)  LVH: MILD LVH  Mitral: TRIVIAL MR  Tricuspid: TRIVIAL TR     Neuro/Psych Ankylosing spondylitis  Neuromuscular disease (diabetic neuropathy)    GI/Hepatic ,GERD  Medicated,,  Endo/Other  diabetes, Type 2  Class 3 obesity  Renal/GU negative Renal ROS Bladder dysfunction  Prostate CA    Musculoskeletal  (+) Arthritis ,    Abdominal  (+) + obese  Peds  Hematology negative hematology ROS (+)   Anesthesia Other Findings Cardiology note 10/11/22:  Cardiovascular Problem List  -HTN -HLD -insulin dependent type II DM -OSA -long term current use of ASA -obesity -former tobacco use -cervical myelopathy s/p cervical fusion, -Fracture T9 with ORIF , posterolateral arthrodesis T7-T12 on 11/12/22, - bladder neck contracture requiring dilatation  and foley catheter during the neurosurgery on 5/10 , followed by hospitalization 12/01/22-12/07/22 for complicated UTI -11/2022- pulmonary emboli and started on  eliquis.   Bladder Calculus   Reproductive/Obstetrics                             Anesthesia Physical Anesthesia Plan  ASA: 3  Anesthesia Plan: General   Post-op Pain Management:    Induction: Intravenous  PONV Risk Score and Plan: 2 and Ondansetron, Dexamethasone and Treatment may vary due to age or medical condition  Airway Management Planned: Oral ETT  Additional Equipment:   Intra-op Plan:   Post-operative Plan: Extubation in OR  Informed Consent: I have reviewed the patients History and Physical, chart, labs and discussed the procedure including the risks, benefits and alternatives for the proposed anesthesia with the patient or authorized representative who has indicated his/her understanding and acceptance.     Dental advisory given  Plan Discussed with: CRNA  Anesthesia Plan Comments: (Patient consented for risks of anesthesia including but not limited to:  - adverse reactions to medications - damage to eyes, teeth, lips or other oral mucosa - nerve damage due to positioning  - sore throat or hoarseness - damage to heart, brain, nerves, lungs, other parts of body or loss of life  Informed patient about role of CRNA in peri- and intra-operative care.  Patient voiced understanding.)        Anesthesia Quick Evaluation

## 2023-02-15 NOTE — Transfer of Care (Signed)
Immediate Anesthesia Transfer of Care Note  Patient: Alexander Wilcox  Procedure(s) Performed: CYSTOSCOPY WITH LITHOLAPAXY  Patient Location: PACU  Anesthesia Type:General  Level of Consciousness: awake, drowsy, and patient cooperative  Airway & Oxygen Therapy: Patient Spontanous Breathing and Patient connected to face mask oxygen  Post-op Assessment: Report given to RN and Post -op Vital signs reviewed and stable  Post vital signs: Reviewed and stable  Last Vitals:  Vitals Value Taken Time  BP 111/52 02/15/23 1246  Temp 37.2 C 02/15/23 1243  Pulse 87 02/15/23 1254  Resp 11 02/15/23 1254  SpO2 100 % 02/15/23 1254  Vitals shown include unfiled device data.  Last Pain:  Vitals:   02/15/23 1252  TempSrc:   PainSc: 10-Worst pain ever         Complications:  Encounter Notable Events  Notable Event Outcome Phase Comment  Difficult to intubate - expected  Intraprocedure Filed from anesthesia note documentation.

## 2023-02-15 NOTE — Anesthesia Postprocedure Evaluation (Deleted)
Anesthesia Post Note  Patient: Alexander Wilcox  Procedure(s) Performed: CYSTOSCOPY WITH LITHOLAPAXY  Patient location during evaluation: PACU Anesthesia Type: General Level of consciousness: awake and alert Pain management: pain level controlled Vital Signs Assessment: post-procedure vital signs reviewed and stable Respiratory status: spontaneous breathing, nonlabored ventilation and respiratory function stable Cardiovascular status: blood pressure returned to baseline and stable Postop Assessment: no apparent nausea or vomiting Anesthetic complications: yes   Encounter Notable Events  Notable Event Outcome Phase Comment  Difficult to intubate - expected  Intraprocedure Filed from anesthesia note documentation.     Last Vitals:  Vitals:   02/15/23 1315 02/15/23 1330  BP: (!) 106/46 (!) 106/52  Pulse: 90 85  Resp: 13 11  Temp: 37.2 C   SpO2: 97% 96%    Last Pain:  Vitals:   02/15/23 1312  TempSrc:   PainSc: Asleep                 Foye Deer

## 2023-02-15 NOTE — Interval H&P Note (Signed)
History and Physical Interval Note:  02/15/2023 10:37 AM  Alexander Wilcox  has presented today for surgery, with the diagnosis of Bladder Calculus.  The various methods of treatment have been discussed with the patient and family. After consideration of risks, benefits and other options for treatment, the patient has consented to  Procedure(s): CYSTOSCOPY WITH LITHOLAPAXY (N/A) as a surgical intervention.  The patient's history has been reviewed, patient examined, no change in status, stable for surgery.  I have reviewed the patient's chart and labs.  Questions were answered to the patient's satisfaction.    Office cystoscopy performed 02/08/2023 remarkable for significant soft calcification bladder.  He presents for cystoscopy under anesthesia and cystolitholapaxy.  Urine culture 02/08/2023 no growth  CV: RRR Lungs: Clear   C 

## 2023-02-15 NOTE — Discharge Instructions (Addendum)
Cystoscopy patient instructions  Following a cystoscopy, a catheter (a flexible rubber tube) is sometimes left in place to empty the bladder. This may cause some discomfort or a feeling that you need to urinate. Your doctor determines the period of time that the catheter will be left in place. You may have bloody urine for two to three days (Call your doctor if the amount of bleeding increases or does not subside).  You may pass blood clots in your urine, especially if you had a biopsy. It is not unusual to pass small blood clots and have some bloody urine a couple of weeks after your cystoscopy. Again, call your doctor if the bleeding does not subside. You may have: Dysuria (painful urination) Frequency (urinating often) Urgency (strong desire to urinate)  These symptoms are common especially if medicine is instilled into the bladder or a ureteral stent is placed. Avoiding alcohol and caffeine, such as coffee, tea, and chocolate, may help relieve these symptoms. Drink plenty of water, unless otherwise instructed. Your doctor may also prescribe an antibiotic or other medicine to reduce these symptoms.  Cystoscopy results are available soon after the procedure; biopsy results usually take two to four days. Your doctor will discuss the results of your exam with you. Before you go home, you will be given specific instructions for follow-up care. Special Instructions:   If you are going home with a catheter in place do not take a tub bath until removed by your doctor.   You may resume your normal activities.   Do not drive or operate machinery if you are taking narcotic pain medicine.   Be sure to keep all follow-up appointments with your doctor.   Call Your Doctor If: The catheter is not draining You have severe pain You are unable to urinate You have a fever over 101 You have severe bleeding         AMBULATORY SURGERY  DISCHARGE INSTRUCTIONS   The drugs that you were given will stay in  your system until tomorrow so for the next 24 hours you should not:  Drive an automobile Make any legal decisions Drink any alcoholic beverage   You may resume regular meals tomorrow.  Today it is better to start with liquids and gradually work up to solid foods.  You may eat anything you prefer, but it is better to start with liquids, then soup and crackers, and gradually work up to solid foods.   Please notify your doctor immediately if you have any unusual bleeding, trouble breathing, redness and pain at the surgery site, drainage, fever, or pain not relieved by medication.    Additional Instructions:        Please contact your physician with any problems or Same Day Surgery at [phone], Monday through Friday 6 am to 4 pm, or [facility] [facility] number at [phone].  

## 2023-02-15 NOTE — Anesthesia Procedure Notes (Signed)
Procedure Name: Intubation Date/Time: 02/15/2023 10:58 AM  Performed by: Mohammed Kindle, CRNAPre-anesthesia Checklist: Patient identified, Emergency Drugs available, Suction available and Patient being monitored Patient Re-evaluated:Patient Re-evaluated prior to induction Oxygen Delivery Method: Circle system utilized Preoxygenation: Pre-oxygenation with 100% oxygen Induction Type: IV induction Ventilation: Mask ventilation without difficulty Laryngoscope Size: McGraph and 3 Grade View: Grade II Tube type: Oral Tube size: 7.0 mm Number of attempts: 1 Airway Equipment and Method: Stylet and Oral airway Placement Confirmation: ETT inserted through vocal cords under direct vision, positive ETCO2, breath sounds checked- equal and bilateral and CO2 detector Secured at: 21 cm Tube secured with: Tape Dental Injury: Teeth and Oropharynx as per pre-operative assessment  Difficulty Due To: Difficulty was anticipated, Difficult Airway- due to reduced neck mobility and Difficult Airway- due to limited oral opening Future Recommendations: Recommend- induction with short-acting agent, and alternative techniques readily available

## 2023-02-16 ENCOUNTER — Other Ambulatory Visit: Payer: Self-pay | Admitting: *Deleted

## 2023-02-18 NOTE — Anesthesia Postprocedure Evaluation (Signed)
Anesthesia Post Note  Patient: Alexander Wilcox  Procedure(s) Performed: CYSTOSCOPY WITH LITHOLAPAXY  Patient location during evaluation: PACU Anesthesia Type: General Level of consciousness: awake and alert Pain management: pain level controlled Vital Signs Assessment: post-procedure vital signs reviewed and stable Respiratory status: spontaneous breathing, nonlabored ventilation and respiratory function stable Cardiovascular status: blood pressure returned to baseline and stable Postop Assessment: no apparent nausea or vomiting Anesthetic complications: yes   Encounter Notable Events  Notable Event Outcome Phase Comment  Difficult to intubate - expected  Intraprocedure Filed from anesthesia note documentation.     Last Vitals:  Vitals:   02/15/23 1400 02/15/23 1440  BP: (!) 104/51 (!) 119/53  Pulse: 83   Resp: (!) 7   Temp: 36.6 C   SpO2: 97% 99%    Last Pain:  Vitals:   02/15/23 1312  TempSrc:   PainSc: Asleep                 Foye Deer

## 2023-02-22 ENCOUNTER — Ambulatory Visit: Payer: Medicare HMO | Admitting: Physician Assistant

## 2023-02-22 ENCOUNTER — Encounter: Payer: Self-pay | Admitting: Physician Assistant

## 2023-02-22 DIAGNOSIS — R351 Nocturia: Secondary | ICD-10-CM

## 2023-02-22 DIAGNOSIS — N210 Calculus in bladder: Secondary | ICD-10-CM

## 2023-02-22 DIAGNOSIS — N342 Other urethritis: Secondary | ICD-10-CM

## 2023-02-22 DIAGNOSIS — Z87442 Personal history of urinary calculi: Secondary | ICD-10-CM

## 2023-02-22 DIAGNOSIS — Z09 Encounter for follow-up examination after completed treatment for conditions other than malignant neoplasm: Secondary | ICD-10-CM

## 2023-02-22 NOTE — Progress Notes (Addendum)
Catheter Removal  Patient is present today for a catheter removal.  8ml of water was drained from the balloon. A 18FR foley cath was removed from the bladder, no complications were noted. Patient tolerated well.  Performed by: Ples Specter CMA   Additional notes: Patient requested that I see him.  He reports some perineal discomfort and wants to make sure nothing is wrong.  This has been ongoing for at least 2 weeks, no fevers, and he admits that it has improved since Foley removal today.  I was able to thoroughly examine his scrotum and perineum today and noticed no erythema, warmth, fluctuance, or crepitus.  By history, his tenderness seems to track along his urethra, which would be consistent with his dystrophic urethral calcifications they were treated by Dr. Lonna Cobb last week.  I provided reassurance today.  I also helped them fit him for a condom catheter and demonstrated application and removal.  I gave him more samples to take home and try and they will contact us with their preferred order. They plan to use them only at nighttime.  Carman Ching, PA-C   I spent 15 minutes on the day of the encounter to include pre-visit record review, face-to-face time with the patient, and post-visit ordering of tests.

## 2023-03-02 DIAGNOSIS — R310 Gross hematuria: Secondary | ICD-10-CM | POA: Diagnosis not present

## 2023-03-02 DIAGNOSIS — Z86711 Personal history of pulmonary embolism: Secondary | ICD-10-CM | POA: Diagnosis not present

## 2023-03-03 ENCOUNTER — Other Ambulatory Visit: Payer: Self-pay | Admitting: Physician Assistant

## 2023-03-03 DIAGNOSIS — R310 Gross hematuria: Secondary | ICD-10-CM

## 2023-03-03 DIAGNOSIS — Z86711 Personal history of pulmonary embolism: Secondary | ICD-10-CM

## 2023-03-10 ENCOUNTER — Other Ambulatory Visit: Payer: Medicare HMO

## 2023-03-10 ENCOUNTER — Ambulatory Visit
Admission: RE | Admit: 2023-03-10 | Discharge: 2023-03-10 | Disposition: A | Discharge status: Home / Self Care | Payer: Medicare HMO | Source: Ambulatory Visit | Attending: Physician Assistant | Admitting: Physician Assistant

## 2023-03-10 DIAGNOSIS — R310 Gross hematuria: Secondary | ICD-10-CM | POA: Diagnosis not present

## 2023-03-10 DIAGNOSIS — C61 Malignant neoplasm of prostate: Secondary | ICD-10-CM | POA: Diagnosis not present

## 2023-03-10 DIAGNOSIS — I700 Atherosclerosis of aorta: Secondary | ICD-10-CM | POA: Diagnosis not present

## 2023-03-10 DIAGNOSIS — Z86711 Personal history of pulmonary embolism: Secondary | ICD-10-CM | POA: Insufficient documentation

## 2023-03-10 DIAGNOSIS — I2510 Atherosclerotic heart disease of native coronary artery without angina pectoris: Secondary | ICD-10-CM | POA: Diagnosis not present

## 2023-03-10 LAB — POCT I-STAT CREATININE: Creatinine, Ser: <0.2 mg/dL — ABNORMAL LOW (ref 0.61–1.24)

## 2023-03-10 MED ORDER — IOHEXOL 350 MG/ML SOLN
75.0000 mL | Freq: Once | INTRAVENOUS | Status: AC | PRN
  Administered 2023-03-10: 75 mL via INTRAVENOUS

## 2023-03-11 LAB — PSA: Prostate Specific Ag, Serum: <0.1 ng/mL (ref 0.0–4.0)

## 2023-03-15 NOTE — Telephone Encounter (Signed)
Pt is schedule for 09/13 to see Dr.Stoioff

## 2023-03-16 ENCOUNTER — Other Ambulatory Visit: Payer: Medicare HMO

## 2023-03-16 ENCOUNTER — Other Ambulatory Visit: Payer: Self-pay | Admitting: Physician Assistant

## 2023-03-16 DIAGNOSIS — Z86718 Personal history of other venous thrombosis and embolism: Secondary | ICD-10-CM

## 2023-03-18 ENCOUNTER — Ambulatory Visit: Payer: Medicare HMO | Admitting: Urology

## 2023-03-18 ENCOUNTER — Encounter: Payer: Self-pay | Admitting: Urology

## 2023-03-18 VITALS — BP 132/96 | HR 88 | Ht 70.0 in | Wt 227.0 lb

## 2023-03-18 DIAGNOSIS — R8281 Pyuria: Secondary | ICD-10-CM

## 2023-03-18 DIAGNOSIS — N4889 Other specified disorders of penis: Secondary | ICD-10-CM | POA: Diagnosis not present

## 2023-03-18 DIAGNOSIS — N210 Calculus in bladder: Secondary | ICD-10-CM

## 2023-03-18 DIAGNOSIS — R102 Pelvic and perineal pain: Secondary | ICD-10-CM

## 2023-03-18 DIAGNOSIS — R32 Unspecified urinary incontinence: Secondary | ICD-10-CM

## 2023-03-18 DIAGNOSIS — R310 Gross hematuria: Secondary | ICD-10-CM

## 2023-03-18 DIAGNOSIS — N3642 Intrinsic sphincter deficiency (ISD): Secondary | ICD-10-CM

## 2023-03-18 LAB — URINALYSIS, COMPLETE
Bilirubin, UA: NEGATIVE
Glucose, UA: NEGATIVE
Ketones, UA: NEGATIVE
Nitrite, UA: NEGATIVE
Specific Gravity, UA: 1.025 (ref 1.005–1.030)
Urobilinogen, Ur: 0.2 mg/dL (ref 0.2–1.0)
pH, UA: 7 (ref 5.0–7.5)

## 2023-03-18 LAB — MICROSCOPIC EXAMINATION
RBC, Urine: >30 /HPF — AB (ref 0–2)
WBC, UA: >30 /HPF — AB (ref 0–5)

## 2023-03-18 MED ORDER — DOXYCYCLINE HYCLATE 100 MG PO CAPS: 100.0000 mg | ORAL_CAPSULE | Freq: Two times a day (BID) | ORAL | 0 refills | Status: DC

## 2023-03-18 NOTE — Progress Notes (Signed)
I, Maysun Anabel Bene, acting as a scribe for Riki Altes, MD., have documented all relevant documentation on the behalf of Riki Altes, MD, as directed by Riki Altes, MD while in the presence of Riki Altes, MD.  03/18/2023 7:35 PM   Alexander Wilcox [birth year] [id number]  Referring provider: Marisue Ivan, MD [phone] [facility] [address] [facility] [place] [place],  [place] [address]  Chief Complaint  Patient presents with   Other   Urologic history: 1. Prostate cancer Initially seen by me March 2024 to establish urologic care.  Previously followed by Dr. Evelene Croon and his records are incomplete regarding prostate cancer treatment. Prostate biopsy performed in 11/21/2008 positive for prostate cancer though path report is not in forwarded records. He was treated with HIFU with decrease in his PSA. Decrease in his PSA to the 0.2-0.4 range.  PSA began to rise in 2012 and he underwent a repeat biopsy in March 2014 or PSA of 3.8, which again was positive for cancer, though path report not available. He underwent repeat HIFU with decreased PSA to the 0.1-0.3 range.  He had a slowly rising PSA and biopsy was repeated February 2017 for a PSA of 3.65, and he underwent IMRT+ADT.  PSA recurrence November 2021 to a PSA of 4.5 and was placed on Leuprolide+Erleada with undetectable PSA.  PSA recurrence in 2017 was a Gleason 4+5. Seen by a medical oncology May 2021 and he underwent an Axumin PET scan, which showed no evidence of metastatic disease.  PSA has remained undetectable.   2. Bladder neck contracture Foley catheter unable to be placed 11/12/22 at the time of spine surgery by neurosurgery and it was seen intraoperatively by Dr. Richardo Hanks and required flexible cystoscopy, which showed a dense bladder neck contracture just proximal to the Veru. He underwent dilation with catheter placement over a wire. Catheter removed 12/16/22 for a voiding trial. Since catheter removal, he has had  chronic urinary incontinence with low PVR.  Cystoscopy 02/11/23 showed multiple calcifications throughout the urethra with mucosal erythema. The bladder demonstrated marked mucosal erythema with large soft calcifications throughout the bladder. No discernible prostate tissue was noted endoscopically.  He underwent cystoscopy under anesthesia with findings of dystrophic calcifications throughout the prostatic urethra endostricture bladder neck contracture. There were multiple bladder calculi the largest measured approximately 3 cm. All calculi had a thin shell with an inner core of fibrinous material.  He has had chronic penile pain since his catheter was removed, which has persisted.  HPI: Alexander Wilcox is a 72 y.o. male presents for follow-up visit.   He has continued to have chronic urinary incontinence. Feels like he has an obstruction in his urethra, though his bladder scans have shown no residual. He continued to have penile pain, and has recently developed perineal pain over the last 3-4 weeks.  No fever, chills.  His urine has been cloudy.   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   Bladder stone    Carotid artery stenosis    Diabetes mellitus type 2, insulin dependent (HCC)    GERD (gastroesophageal reflux disease)    History of kidney stones    Hyperlipidemia    Hypertension    Multiple pulmonary emboli (HCC) 12/01/2022   RLL   Obstructive sleep apnea on CPAP    Prostate cancer (HCC)    seeds,radiation,chemo pill  Sepsis secondary to UTI (HCC) 11/2022   T9 vertebral fracture (HCC) 11/2022    Surgical History: Past Surgical History:  Procedure Laterality Date   APPENDECTOMY     CERVICAL FUSION     COLONOSCOPY     CYSTOSCOPY  11/12/2022   Procedure: Cystoscopy, dilation of bladder neck contracture, complex Foley catheter placement;  Surgeon: Sondra Come, MD;   Location: [facility] ORS;  Service: Urology;;   CYSTOSCOPY WITH LITHOLAPAXY N/A 02/15/2023   Procedure: CYSTOSCOPY WITH LITHOLAPAXY;  Surgeon: Riki Altes, MD;  Location: [facility] ORS;  Service: Urology;  Laterality: N/A;   INSERTION PROSTATE RADIATION SEED     KNEE ARTHROSCOPY Left    x2   KNEE ARTHROSCOPY Right    x2   LUMBAR FUSION     POSTERIOR FUSION THORACIC SPINE  11/12/2022   T7-12    Home Medications:  Allergies as of 03/18/2023       Reactions   Cefuroxime Other (See Comments)   Mouth sores   Dilaudid [hydromorphone Hcl] Itching        Medication List        Accurate as of March 18, 2023  7:35 PM. If you have any questions, ask your nurse or doctor.          STOP taking these medications    oxybutynin 5 MG tablet Commonly known as: DITROPAN Stopped by: Riki Altes       TAKE these medications    apixaban 5 MG Tabs tablet Commonly known as: ELIQUIS Take 2 tablets (10 mg total) by mouth 2 (two) times daily. Then from 12/12/2022 take 5 mg twice a day   atorvastatin 40 MG tablet Commonly known as: LIPITOR Take 40 mg by mouth at bedtime.   cyanocobalamin 1000 MCG tablet Take 1,000 mcg by mouth daily.   doxycycline 100 MG capsule Commonly known as: VIBRAMYCIN Take 1 capsule (100 mg total) by mouth every 12 (twelve) hours. Started by: Riki Altes   fenofibrate 160 MG tablet Take 160 mg by mouth at bedtime.   ferrous sulfate 325 (65 FE) MG tablet Take 325 mg by mouth daily.   lisinopril 10 MG tablet Commonly known as: ZESTRIL Take 1 tablet (10 mg total) by mouth daily.   metFORMIN 500 MG 24 hr tablet Commonly known as: GLUCOPHAGE-XR Take 1 tablet by mouth 2 (two) times daily. Pt taking one in the morning and one at night   metoprolol succinate 25 MG 24 hr tablet Commonly known as: TOPROL-XL Take 25 mg by mouth daily.   omeprazole 40 MG capsule Commonly known as: PRILOSEC Take 40 mg by mouth daily.   oxyCODONE 5 MG immediate  release tablet Commonly known as: Oxy IR/ROXICODONE Take 1 tablet (5 mg total) by mouth every 4 (four) hours as needed for severe pain.   Ozempic (0.25 or 0.5 MG/DOSE) 2 MG/1.5ML Sopn Generic drug: Semaglutide(0.25 or 0.5MG /DOS) Inject 0.5 mLs into the skin once a week. monday   Toujeo SoloStar 300 UNIT/ML Solostar Pen Generic drug: insulin glargine (1 Unit Dial) Inject 50 Units into the skin daily.        Allergies:  Allergies  Allergen Reactions   Cefuroxime Other (See Comments)    Mouth sores   Dilaudid [Hydromorphone Hcl] Itching    Family History: Family History  Problem Relation Age of Onset   Diabetes Sister    Diabetes Maternal Grandmother    Lung disease Brother    Kidney failure Sister  Social History:  reports that he quit smoking about 26 years ago. His smoking use included cigarettes. He started smoking about 56 years ago. He has a 60 pack-year smoking history. He has been exposed to tobacco smoke. He has never used smokeless tobacco. He reports that he does not currently use alcohol. He reports that he does not currently use drugs.   Physical Exam: BP (!) 132/96   Pulse 88   Ht 5\' 10"  (1.778 m)   Wt 227 lb (103 kg)   BMI 32.57 kg/m   Constitutional:  Alert and oriented, No acute distress. HEENT: [place] AT, moist mucus membranes.  Trachea midline, no masses. Cardiovascular: No clubbing, cyanosis, or edema. Respiratory: Normal respiratory effort, no increased work of breathing. GI: Abdomen is soft, nontender, nondistended, no abdominal masses Skin: No rashes, bruises or suspicious lesions. Neurologic: Grossly intact, no focal deficits, moving all 4 extremities. Psychiatric: Normal mood and affect.   Urinalysis Dipstick 3+ blood/3+ protein/1+ leukocytes/microscopy >30 WBC/>30 RBC/amorphous sediment.   Pertinent Imaging: Bladder scan was personally reviewed and interpreted.   US RENAL  Narrative CLINICAL DATA:  [id number] Acute kidney failure (HCC)  [id number]  EXAM: RENAL / URINARY TRACT ULTRASOUND COMPLETE  COMPARISON:  CT 12/01/2022  FINDINGS: Right Kidney:  Renal measurements: 15.0 x 6.3 x 5.5 cm = volume: 267.6 mL. Mild pelviectasis, decreased from prior CT. Normal echogenicity. There is a 2.2 cm cystic lesion in the upper pole, poorly visualized.  Left Kidney:  Renal measurements: 13.1 x 6.5 x 6.1 cm = volume: 271.8 mL. Mild pelviectasis, decreased from prior CT. Normal echogenicity.  Bladder:  Decompressed with Foley catheter in place.  Other:  Hepatic steatosis.  IMPRESSION: Mild renal pelviectasis bilaterally. Bladder is decompressed with Foley catheter in place.   Electronically Signed By: Caprice Renshaw M.D. On: 12/03/2022 11:58  No valid procedures specified. No results found for this or any previous visit.  No results found for this or any previous visit.   Assessment & Plan:    He was having no bothersome symptoms prior to dilation of his bladder neck contracture and post-dilation with catheter removal has had chronic penile pain and chronic urinary incontinence, though no urinary retention.  He has developed perineal pain over the last 3-4 weeks.  UA today with significant pyuria/hematuria.   Plan:  1. CT adenopelvis without contrast.  2. Urine culture ordered and will empirically start doxycycline 100 mg twice daily. 3. If no significant findings, we'll refer to [facility] for a second opinion regarding symptom management.  [facility] [address] [place], [place] [address] [phone]

## 2023-03-19 ENCOUNTER — Encounter: Payer: Self-pay | Admitting: Urology

## 2023-03-20 ENCOUNTER — Encounter: Payer: Self-pay | Admitting: Urology

## 2023-03-21 ENCOUNTER — Ambulatory Visit
Admission: RE | Admit: 2023-03-21 | Discharge: 2023-03-21 | Disposition: A | Discharge status: Home / Self Care | Payer: Medicare HMO | Source: Ambulatory Visit | Attending: Physician Assistant | Admitting: Physician Assistant

## 2023-03-21 ENCOUNTER — Encounter: Payer: Self-pay | Admitting: *Deleted

## 2023-03-21 ENCOUNTER — Ambulatory Visit: Payer: Medicare HMO | Admitting: Urology

## 2023-03-21 DIAGNOSIS — Z86718 Personal history of other venous thrombosis and embolism: Secondary | ICD-10-CM | POA: Insufficient documentation

## 2023-03-21 DIAGNOSIS — I82442 Acute embolism and thrombosis of left tibial vein: Secondary | ICD-10-CM | POA: Diagnosis not present

## 2023-03-23 LAB — CULTURE, URINE COMPREHENSIVE

## 2023-03-24 ENCOUNTER — Other Ambulatory Visit: Payer: Self-pay | Admitting: Physician Assistant

## 2023-03-24 DIAGNOSIS — R300 Dysuria: Secondary | ICD-10-CM

## 2023-03-24 MED ORDER — URIBEL 118 MG PO CAPS: 1.0000 | ORAL_CAPSULE | Freq: Four times a day (QID) | ORAL | 5 refills | Status: DC | PRN

## 2023-03-24 NOTE — Progress Notes (Signed)
I spoke with the patient and his wife via telephone this afternoon. They have questions regarding whether Dr. Excell Seltzer at [facility] has appropriate expertise to address his urethral pain and incontinence. I explained that often if the referral to the individual provider is not appropriate, the referral coordinator can get him on the correct provider's schedule.  He is having ongoing urethral burning with and without urination and intense itching from taking pain meds. I'm sending in an rx for Uribel instead. We discussed using GoodRx coupon to keep cost down.

## 2023-03-25 ENCOUNTER — Ambulatory Visit
Admission: RE | Admit: 2023-03-25 | Discharge: 2023-03-25 | Disposition: A | Discharge status: Home / Self Care | Payer: Medicare HMO | Source: Ambulatory Visit | Attending: Urology | Admitting: Urology

## 2023-03-25 DIAGNOSIS — R319 Hematuria, unspecified: Secondary | ICD-10-CM | POA: Diagnosis not present

## 2023-03-25 DIAGNOSIS — N4889 Other specified disorders of penis: Secondary | ICD-10-CM | POA: Diagnosis not present

## 2023-03-25 DIAGNOSIS — R102 Pelvic and perineal pain: Secondary | ICD-10-CM | POA: Diagnosis not present

## 2023-03-25 DIAGNOSIS — N3642 Intrinsic sphincter deficiency (ISD): Secondary | ICD-10-CM | POA: Insufficient documentation

## 2023-03-25 DIAGNOSIS — R8281 Pyuria: Secondary | ICD-10-CM | POA: Insufficient documentation

## 2023-03-25 DIAGNOSIS — R32 Unspecified urinary incontinence: Secondary | ICD-10-CM | POA: Insufficient documentation

## 2023-03-25 DIAGNOSIS — N200 Calculus of kidney: Secondary | ICD-10-CM | POA: Diagnosis not present

## 2023-03-25 DIAGNOSIS — N281 Cyst of kidney, acquired: Secondary | ICD-10-CM | POA: Diagnosis not present

## 2023-03-25 MED ORDER — OXYCODONE HCL 5 MG PO TABS: 5.0000 mg | ORAL_TABLET | Freq: Four times a day (QID) | ORAL | 0 refills | Status: DC | PRN

## 2023-03-25 NOTE — Addendum Note (Signed)
Addended by: Debarah Crape on: 03/25/2023 04:12 PM   Modules accepted: Orders

## 2023-03-25 NOTE — Telephone Encounter (Addendum)
-----   Message from Verna Czech [facility] sent at 03/25/2023  7:39 AM EDT ----- Urine culture was growing bacteria.  There was a note in the chart yesterday stating patient was complaining of itching related to the antibiotics.  If this is bothersome can switch to amoxicillin 875 mg twice daily x 7 days

## 2023-03-28 ENCOUNTER — Other Ambulatory Visit: Payer: Self-pay | Admitting: *Deleted

## 2023-03-29 ENCOUNTER — Telehealth: Payer: Self-pay | Admitting: *Deleted

## 2023-03-29 DIAGNOSIS — R102 Pelvic and perineal pain: Secondary | ICD-10-CM

## 2023-03-29 MED ORDER — AMOXICILLIN 875 MG PO TABS: 875.0000 mg | ORAL_TABLET | Freq: Two times a day (BID) | ORAL | 0 refills | Status: AC

## 2023-03-29 MED ORDER — HYDROCODONE-ACETAMINOPHEN 5-325 MG PO TABS
1.0000 | ORAL_TABLET | Freq: Four times a day (QID) | ORAL | 0 refills | Status: DC | PRN
Start: 2023-03-29 — End: 2023-11-10

## 2023-03-29 NOTE — Telephone Encounter (Signed)
Patient would like hydrocodone because oxycodone make him itch.

## 2023-03-29 NOTE — Telephone Encounter (Signed)
Left message on voicemail.

## 2023-04-12 ENCOUNTER — Encounter: Payer: Self-pay | Admitting: Urology

## 2023-04-12 DIAGNOSIS — R200 Anesthesia of skin: Secondary | ICD-10-CM | POA: Diagnosis not present

## 2023-04-19 DIAGNOSIS — R200 Anesthesia of skin: Secondary | ICD-10-CM | POA: Diagnosis not present

## 2023-04-19 DIAGNOSIS — R262 Difficulty in walking, not elsewhere classified: Secondary | ICD-10-CM | POA: Diagnosis not present

## 2023-04-19 DIAGNOSIS — M79642 Pain in left hand: Secondary | ICD-10-CM | POA: Diagnosis not present

## 2023-04-19 DIAGNOSIS — R42 Dizziness and giddiness: Secondary | ICD-10-CM | POA: Diagnosis not present

## 2023-04-19 DIAGNOSIS — R29818 Other symptoms and signs involving the nervous system: Secondary | ICD-10-CM | POA: Diagnosis not present

## 2023-04-19 DIAGNOSIS — M79641 Pain in right hand: Secondary | ICD-10-CM | POA: Diagnosis not present

## 2023-04-22 ENCOUNTER — Other Ambulatory Visit: Payer: Medicare HMO

## 2023-04-27 ENCOUNTER — Ambulatory Visit: Payer: Medicare HMO | Admitting: Urology

## 2023-05-11 ENCOUNTER — Other Ambulatory Visit: Payer: Self-pay

## 2023-05-11 DIAGNOSIS — S22079S Unspecified fracture of T9-T10 vertebra, sequela: Secondary | ICD-10-CM

## 2023-05-12 ENCOUNTER — Ambulatory Visit
Admission: RE | Admit: 2023-05-12 | Discharge: 2023-05-12 | Disposition: A | Discharge status: Home / Self Care | Payer: Medicare HMO | Source: Ambulatory Visit | Attending: Neurosurgery | Admitting: Neurosurgery

## 2023-05-12 ENCOUNTER — Ambulatory Visit: Payer: Medicare HMO | Admitting: Neurosurgery

## 2023-05-12 ENCOUNTER — Ambulatory Visit
Admission: RE | Admit: 2023-05-12 | Discharge: 2023-05-12 | Disposition: A | Discharge status: Home / Self Care | Payer: Medicare HMO | Attending: Neurosurgery | Admitting: Neurosurgery

## 2023-05-12 ENCOUNTER — Encounter: Payer: Self-pay | Admitting: Neurosurgery

## 2023-05-12 VITALS — BP 122/70 | Ht 70.0 in | Wt 227.0 lb

## 2023-05-12 DIAGNOSIS — M47814 Spondylosis without myelopathy or radiculopathy, thoracic region: Secondary | ICD-10-CM | POA: Diagnosis not present

## 2023-05-12 DIAGNOSIS — S22079D Unspecified fracture of T9-T10 vertebra, subsequent encounter for fracture with routine healing: Secondary | ICD-10-CM | POA: Diagnosis not present

## 2023-05-12 DIAGNOSIS — Z09 Encounter for follow-up examination after completed treatment for conditions other than malignant neoplasm: Secondary | ICD-10-CM | POA: Diagnosis not present

## 2023-05-12 DIAGNOSIS — S22079S Unspecified fracture of T9-T10 vertebra, sequela: Secondary | ICD-10-CM

## 2023-05-12 DIAGNOSIS — Z4789 Encounter for other orthopedic aftercare: Secondary | ICD-10-CM | POA: Diagnosis not present

## 2023-05-12 DIAGNOSIS — R2689 Other abnormalities of gait and mobility: Secondary | ICD-10-CM

## 2023-05-12 DIAGNOSIS — M4714 Other spondylosis with myelopathy, thoracic region: Secondary | ICD-10-CM

## 2023-05-12 DIAGNOSIS — Z981 Arthrodesis status: Secondary | ICD-10-CM | POA: Diagnosis not present

## 2023-05-12 NOTE — Progress Notes (Signed)
   REFERRING PHYSICIAN:  Marisue Ivan, Md [address] [place],  [place] [address]  DOS: 11/12/22  Open reduction internal fixation of T9 fracture  HISTORY OF PRESENT ILLNESS: Granville Lewis Stokke iss/p T spine fusion for unstable fracture.  He is doing much better, but still has leg weakness.  He continues to have problems with his balance.  He continues to have issues with his bladder.  He has been referred to [facility].   PHYSICAL EXAMINATION:  General: Patient is well developed, well nourished, calm, collected, and in no apparent distress.   NEUROLOGICAL:  General: In no acute distress.   Awake, alert, oriented to person, place, and time.  Pupils equal round and reactive to light.  Facial tone is symmetric.     Strength:            Side Iliopsoas Quads Hamstring PF DF EHL  R 5 5 5 5 5 5   L 4 4+ 5 5 5  4+   Incision well healed   ROS (Neurologic):  Negative except as noted above  IMAGING: Thoracic xrays dated 02/01/23:  No complications noted.  Current xrays - well healed  Radiology report for above xrays not available.    ASSESSMENT/PLAN:  RAFAL ARCHULETA is doing well s/p above surgery.  I will refer him to physical therapy.  I will see him back in 6 months to judge his progress.  Venetia Night MD Department of neurosurgery

## 2023-05-16 DIAGNOSIS — I10 Essential (primary) hypertension: Secondary | ICD-10-CM | POA: Diagnosis not present

## 2023-05-16 DIAGNOSIS — Z862 Personal history of diseases of the blood and blood-forming organs and certain disorders involving the immune mechanism: Secondary | ICD-10-CM | POA: Diagnosis not present

## 2023-05-16 DIAGNOSIS — E785 Hyperlipidemia, unspecified: Secondary | ICD-10-CM | POA: Diagnosis not present

## 2023-05-16 DIAGNOSIS — E782 Mixed hyperlipidemia: Secondary | ICD-10-CM | POA: Diagnosis not present

## 2023-05-16 DIAGNOSIS — E538 Deficiency of other specified B group vitamins: Secondary | ICD-10-CM | POA: Diagnosis not present

## 2023-05-16 DIAGNOSIS — E1149 Type 2 diabetes mellitus with other diabetic neurological complication: Secondary | ICD-10-CM | POA: Diagnosis not present

## 2023-05-16 DIAGNOSIS — E1169 Type 2 diabetes mellitus with other specified complication: Secondary | ICD-10-CM | POA: Diagnosis not present

## 2023-05-20 ENCOUNTER — Telehealth: Payer: Self-pay

## 2023-05-20 DIAGNOSIS — E1142 Type 2 diabetes mellitus with diabetic polyneuropathy: Secondary | ICD-10-CM

## 2023-05-20 DIAGNOSIS — I10 Essential (primary) hypertension: Secondary | ICD-10-CM

## 2023-05-23 ENCOUNTER — Telehealth: Payer: Self-pay | Admitting: *Deleted

## 2023-05-23 NOTE — Progress Notes (Signed)
  Care Coordination   Note   05/23/2023 Name: Alexander Wilcox MRN: [id number] DOB: 10-Oct-1950  [name] is a 72 y.o. year old male who sees Marisue Ivan, MD for primary care. I reached out to [name] by phone today to offer care coordination services.  Mr. Boulden was given information about Care Coordination services today including:   The Care Coordination services include support from the care team which includes your Nurse Coordinator, Clinical Social Worker, or Pharmacist.  The Care Coordination team is here to help remove barriers to the health concerns and goals most important to you. Care Coordination services are voluntary, and the patient may decline or stop services at any time by request to their care team member.   Care Coordination Consent Status: Patient did not agree to participate in care coordination services at this time.  Follow up plan:  pt appreciative but declined services at this time   Encounter Outcome:  Patient Refused  Burman Nieves, [facility] Care Coordination Care Guide Direct Dial: [phone]

## 2023-05-24 DIAGNOSIS — E1159 Type 2 diabetes mellitus with other circulatory complications: Secondary | ICD-10-CM | POA: Diagnosis not present

## 2023-05-24 DIAGNOSIS — Z1331 Encounter for screening for depression: Secondary | ICD-10-CM | POA: Diagnosis not present

## 2023-05-24 DIAGNOSIS — G4733 Obstructive sleep apnea (adult) (pediatric): Secondary | ICD-10-CM | POA: Diagnosis not present

## 2023-05-24 DIAGNOSIS — E119 Type 2 diabetes mellitus without complications: Secondary | ICD-10-CM | POA: Diagnosis not present

## 2023-05-24 DIAGNOSIS — Z6835 Body mass index (BMI) 35.0-35.9, adult: Secondary | ICD-10-CM | POA: Diagnosis not present

## 2023-05-24 DIAGNOSIS — E785 Hyperlipidemia, unspecified: Secondary | ICD-10-CM | POA: Diagnosis not present

## 2023-05-24 DIAGNOSIS — E1165 Type 2 diabetes mellitus with hyperglycemia: Secondary | ICD-10-CM | POA: Diagnosis not present

## 2023-05-24 DIAGNOSIS — Z0000 Encounter for general adult medical examination without abnormal findings: Secondary | ICD-10-CM | POA: Diagnosis not present

## 2023-05-24 DIAGNOSIS — E669 Obesity, unspecified: Secondary | ICD-10-CM | POA: Diagnosis not present

## 2023-05-24 DIAGNOSIS — E1169 Type 2 diabetes mellitus with other specified complication: Secondary | ICD-10-CM | POA: Diagnosis not present

## 2023-05-24 DIAGNOSIS — I152 Hypertension secondary to endocrine disorders: Secondary | ICD-10-CM | POA: Diagnosis not present

## 2023-06-01 DIAGNOSIS — M4714 Other spondylosis with myelopathy, thoracic region: Secondary | ICD-10-CM | POA: Diagnosis not present

## 2023-06-09 DIAGNOSIS — E1165 Type 2 diabetes mellitus with hyperglycemia: Secondary | ICD-10-CM | POA: Diagnosis not present

## 2023-06-13 DIAGNOSIS — M4714 Other spondylosis with myelopathy, thoracic region: Secondary | ICD-10-CM | POA: Diagnosis not present

## 2023-06-15 DIAGNOSIS — M4714 Other spondylosis with myelopathy, thoracic region: Secondary | ICD-10-CM | POA: Diagnosis not present

## 2023-06-20 DIAGNOSIS — M4714 Other spondylosis with myelopathy, thoracic region: Secondary | ICD-10-CM | POA: Diagnosis not present

## 2023-06-22 DIAGNOSIS — M4714 Other spondylosis with myelopathy, thoracic region: Secondary | ICD-10-CM | POA: Diagnosis not present

## 2023-06-23 DIAGNOSIS — N368 Other specified disorders of urethra: Secondary | ICD-10-CM | POA: Diagnosis not present

## 2023-06-23 DIAGNOSIS — N393 Stress incontinence (female) (male): Secondary | ICD-10-CM | POA: Diagnosis not present

## 2023-06-23 DIAGNOSIS — N211 Calculus in urethra: Secondary | ICD-10-CM | POA: Diagnosis not present

## 2023-06-23 DIAGNOSIS — Z87891 Personal history of nicotine dependence: Secondary | ICD-10-CM | POA: Diagnosis not present

## 2023-06-23 DIAGNOSIS — N390 Urinary tract infection, site not specified: Secondary | ICD-10-CM | POA: Diagnosis not present

## 2023-06-30 DIAGNOSIS — M4714 Other spondylosis with myelopathy, thoracic region: Secondary | ICD-10-CM | POA: Diagnosis not present

## 2023-11-09 ENCOUNTER — Other Ambulatory Visit: Payer: Self-pay

## 2023-11-09 DIAGNOSIS — M4714 Other spondylosis with myelopathy, thoracic region: Secondary | ICD-10-CM

## 2023-11-10 ENCOUNTER — Telehealth: Payer: Self-pay | Admitting: Neurosurgery

## 2023-11-10 ENCOUNTER — Encounter: Payer: Self-pay | Admitting: Neurosurgery

## 2023-11-10 ENCOUNTER — Ambulatory Visit: Payer: Medicare HMO | Admitting: Neurosurgery

## 2023-11-10 VITALS — BP 134/70 | Ht 71.0 in | Wt 227.0 lb

## 2023-11-10 DIAGNOSIS — G629 Polyneuropathy, unspecified: Secondary | ICD-10-CM | POA: Diagnosis not present

## 2023-11-10 DIAGNOSIS — M4714 Other spondylosis with myelopathy, thoracic region: Secondary | ICD-10-CM

## 2023-11-10 DIAGNOSIS — R2689 Other abnormalities of gait and mobility: Secondary | ICD-10-CM | POA: Diagnosis not present

## 2023-11-10 MED ORDER — METHOCARBAMOL 500 MG PO TABS: 500.0000 mg | ORAL_TABLET | Freq: Four times a day (QID) | ORAL | 0 refills | Status: AC | PRN

## 2023-11-10 NOTE — Addendum Note (Signed)
 Addended by: Jodeen Munch on: 11/10/2023 03:53 PM   Modules accepted: Orders

## 2023-11-10 NOTE — Progress Notes (Signed)
   REFERRING PHYSICIAN:  Monique Ano, Md [address] [place],  [place] [address]  DOS: 11/12/22  Open reduction internal fixation of T9 fracture  HISTORY OF PRESENT ILLNESS: Alexander Wilcox is s/p T spine fusion for unstable fracture.  He has no back pain.  He continues to have problems with his walking with lower extremity weakness.  He feels that his gotten weaker over the past month or so. He continues to have bladder issues.  He is seeing someone at [facility] next week for this.  This has limited his ability to do therapy.  PHYSICAL EXAMINATION:  General: Patient is well developed, well nourished, calm, collected, and in no apparent distress.   NEUROLOGICAL:  General: In no acute distress.   Awake, alert, oriented to person, place, and time.  Pupils equal round and reactive to light.  Facial tone is symmetric.     Strength:            Side Iliopsoas Quads Hamstring PF DF EHL  R 5 5 5 5 5 5   L 4 4+ 5 5 4+ 4+   Incision well healed   ROS (Neurologic):  Negative except as noted above  IMAGING: Thoracic xrays dated 02/01/23:  No complications noted.  Current xrays - well healed  Radiology report for above xrays not available.    ASSESSMENT/PLAN:  Alexander Wilcox is doing well s/p above surgery.  I would like to contact his neurologist regarding whether they feel that his neuropathy might explain his weakness.  If it does not, I will send him for repeat imaging.   Jodeen Munch MD Department of neurosurgery

## 2023-11-10 NOTE — Telephone Encounter (Signed)
 Patient is asking for muscle relaxer

## 2023-11-10 NOTE — Telephone Encounter (Signed)
 [facility] Alexander Wilcox Patient was just seen and he forgot to ask for medication for muscle spasms in his legs.

## 2023-11-11 NOTE — Telephone Encounter (Signed)
 Left detailed voicemail
